# Patient Record
Sex: Male | Born: 1935
Health system: Southern US, Community
[De-identification: ages and names within clinical notes are randomized; demographics above are authoritative.]

## PROBLEM LIST (undated history)

## (undated) DIAGNOSIS — K5909 Other constipation: Secondary | ICD-10-CM

## (undated) DIAGNOSIS — I10 Essential (primary) hypertension: Secondary | ICD-10-CM

## (undated) DIAGNOSIS — R51 Headache: Secondary | ICD-10-CM

## (undated) DIAGNOSIS — K5790 Diverticulosis of intestine, part unspecified, without perforation or abscess without bleeding: Secondary | ICD-10-CM

## (undated) DIAGNOSIS — M1990 Unspecified osteoarthritis, unspecified site: Secondary | ICD-10-CM

## (undated) DIAGNOSIS — K449 Diaphragmatic hernia without obstruction or gangrene: Secondary | ICD-10-CM

## (undated) DIAGNOSIS — N200 Calculus of kidney: Secondary | ICD-10-CM

## (undated) DIAGNOSIS — I2510 Atherosclerotic heart disease of native coronary artery without angina pectoris: Secondary | ICD-10-CM

## (undated) DIAGNOSIS — E782 Mixed hyperlipidemia: Secondary | ICD-10-CM

## (undated) DIAGNOSIS — I219 Acute myocardial infarction, unspecified: Secondary | ICD-10-CM

## (undated) DIAGNOSIS — K219 Gastro-esophageal reflux disease without esophagitis: Secondary | ICD-10-CM

## (undated) DIAGNOSIS — K222 Esophageal obstruction: Secondary | ICD-10-CM

## (undated) DIAGNOSIS — N289 Disorder of kidney and ureter, unspecified: Secondary | ICD-10-CM

## (undated) DIAGNOSIS — K760 Fatty (change of) liver, not elsewhere classified: Secondary | ICD-10-CM

## (undated) DIAGNOSIS — I509 Heart failure, unspecified: Secondary | ICD-10-CM

## (undated) DIAGNOSIS — I255 Ischemic cardiomyopathy: Secondary | ICD-10-CM

## (undated) HISTORY — DX: Other constipation: K59.09

## (undated) HISTORY — PX: NEPHRECTOMY: SHX65

## (undated) HISTORY — DX: Gastro-esophageal reflux disease without esophagitis: K21.9

## (undated) HISTORY — DX: Essential (primary) hypertension: I10

## (undated) HISTORY — DX: Mixed hyperlipidemia: E78.2

## (undated) HISTORY — DX: Atherosclerotic heart disease of native coronary artery without angina pectoris: I25.10

## (undated) HISTORY — DX: Fatty (change of) liver, not elsewhere classified: K76.0

## (undated) HISTORY — DX: Calculus of kidney: N20.0

## (undated) HISTORY — DX: Diaphragmatic hernia without obstruction or gangrene: K44.9

## (undated) HISTORY — PX: HEMORRHOID SURGERY: SHX153

## (undated) HISTORY — DX: Ischemic cardiomyopathy: I25.5

## (undated) HISTORY — DX: Diverticulosis of intestine, part unspecified, without perforation or abscess without bleeding: K57.90

## (undated) HISTORY — PX: KIDNEY STONE SURGERY: SHX686

## (undated) HISTORY — DX: Esophageal obstruction: K22.2

## (undated) HISTORY — PX: KNEE ARTHROSCOPY: SUR90

---

## 2000-01-02 ENCOUNTER — Encounter: Admission: RE | Admit: 2000-01-02 | Discharge: 2000-01-02 | Payer: Self-pay | Admitting: *Deleted

## 2000-01-02 ENCOUNTER — Encounter: Payer: Self-pay | Admitting: *Deleted

## 2000-11-09 HISTORY — PX: CORONARY ARTERY BYPASS GRAFT: SHX141

## 2002-01-13 ENCOUNTER — Ambulatory Visit (HOSPITAL_COMMUNITY): Admission: RE | Admit: 2002-01-13 | Discharge: 2002-01-14 | Payer: Self-pay | Admitting: Cardiology

## 2002-02-09 ENCOUNTER — Encounter: Payer: Self-pay | Admitting: Thoracic Surgery (Cardiothoracic Vascular Surgery)

## 2002-02-13 ENCOUNTER — Encounter: Payer: Self-pay | Admitting: Thoracic Surgery (Cardiothoracic Vascular Surgery)

## 2002-02-13 ENCOUNTER — Inpatient Hospital Stay (HOSPITAL_COMMUNITY)
Admission: RE | Admit: 2002-02-13 | Discharge: 2002-02-18 | Payer: Self-pay | Admitting: Thoracic Surgery (Cardiothoracic Vascular Surgery)

## 2002-02-14 ENCOUNTER — Encounter: Payer: Self-pay | Admitting: Thoracic Surgery (Cardiothoracic Vascular Surgery)

## 2002-02-15 ENCOUNTER — Encounter: Payer: Self-pay | Admitting: Thoracic Surgery (Cardiothoracic Vascular Surgery)

## 2002-05-06 ENCOUNTER — Inpatient Hospital Stay (HOSPITAL_COMMUNITY): Admission: EM | Admit: 2002-05-06 | Discharge: 2002-05-08 | Payer: Self-pay

## 2002-06-01 ENCOUNTER — Ambulatory Visit (HOSPITAL_COMMUNITY): Admission: RE | Admit: 2002-06-01 | Discharge: 2002-06-02 | Payer: Self-pay | Admitting: Cardiology

## 2004-02-20 ENCOUNTER — Emergency Department (HOSPITAL_COMMUNITY): Admission: EM | Admit: 2004-02-20 | Discharge: 2004-02-20 | Payer: Self-pay | Admitting: Emergency Medicine

## 2004-10-07 ENCOUNTER — Ambulatory Visit: Payer: Self-pay | Admitting: Family Medicine

## 2004-11-17 ENCOUNTER — Ambulatory Visit: Payer: Self-pay | Admitting: Family Medicine

## 2005-01-05 ENCOUNTER — Ambulatory Visit: Payer: Self-pay | Admitting: Cardiology

## 2005-06-08 ENCOUNTER — Ambulatory Visit: Payer: Self-pay | Admitting: Family Medicine

## 2005-07-15 ENCOUNTER — Ambulatory Visit: Payer: Self-pay | Admitting: Family Medicine

## 2005-08-11 ENCOUNTER — Ambulatory Visit: Payer: Self-pay | Admitting: Family Medicine

## 2005-09-09 ENCOUNTER — Ambulatory Visit: Payer: Self-pay | Admitting: Family Medicine

## 2005-11-09 DIAGNOSIS — K5790 Diverticulosis of intestine, part unspecified, without perforation or abscess without bleeding: Secondary | ICD-10-CM

## 2005-11-09 HISTORY — DX: Diverticulosis of intestine, part unspecified, without perforation or abscess without bleeding: K57.90

## 2005-12-16 ENCOUNTER — Ambulatory Visit: Payer: Self-pay | Admitting: Family Medicine

## 2006-01-11 ENCOUNTER — Ambulatory Visit: Payer: Self-pay | Admitting: Family Medicine

## 2006-01-28 ENCOUNTER — Ambulatory Visit: Payer: Self-pay | Admitting: Family Medicine

## 2006-02-02 ENCOUNTER — Ambulatory Visit: Payer: Self-pay | Admitting: Family Medicine

## 2006-02-24 ENCOUNTER — Ambulatory Visit: Payer: Self-pay | Admitting: Internal Medicine

## 2006-02-24 ENCOUNTER — Ambulatory Visit (HOSPITAL_COMMUNITY): Admission: RE | Admit: 2006-02-24 | Discharge: 2006-02-24 | Payer: Self-pay | Admitting: Internal Medicine

## 2006-03-10 ENCOUNTER — Ambulatory Visit: Payer: Self-pay | Admitting: Internal Medicine

## 2006-03-15 ENCOUNTER — Ambulatory Visit: Payer: Self-pay | Admitting: Family Medicine

## 2006-03-18 ENCOUNTER — Ambulatory Visit (HOSPITAL_COMMUNITY): Admission: RE | Admit: 2006-03-18 | Discharge: 2006-03-18 | Payer: Self-pay | Admitting: Internal Medicine

## 2006-05-17 ENCOUNTER — Emergency Department (HOSPITAL_COMMUNITY): Admission: EM | Admit: 2006-05-17 | Discharge: 2006-05-17 | Payer: Self-pay | Admitting: Emergency Medicine

## 2006-07-27 ENCOUNTER — Ambulatory Visit: Payer: Self-pay | Admitting: Family Medicine

## 2006-08-24 ENCOUNTER — Ambulatory Visit: Payer: Self-pay | Admitting: Cardiology

## 2006-09-03 ENCOUNTER — Ambulatory Visit: Payer: Self-pay | Admitting: Family Medicine

## 2006-09-21 ENCOUNTER — Ambulatory Visit: Payer: Self-pay | Admitting: Cardiology

## 2006-10-01 ENCOUNTER — Ambulatory Visit: Payer: Self-pay | Admitting: Cardiology

## 2006-10-04 ENCOUNTER — Encounter: Payer: Self-pay | Admitting: Physician Assistant

## 2006-10-08 ENCOUNTER — Inpatient Hospital Stay (HOSPITAL_COMMUNITY): Admission: EM | Admit: 2006-10-08 | Discharge: 2006-10-11 | Payer: Self-pay | Admitting: Emergency Medicine

## 2006-10-19 ENCOUNTER — Ambulatory Visit: Payer: Self-pay | Admitting: Family Medicine

## 2006-11-04 ENCOUNTER — Ambulatory Visit: Payer: Self-pay | Admitting: Cardiovascular Disease

## 2006-11-04 ENCOUNTER — Inpatient Hospital Stay (HOSPITAL_BASED_OUTPATIENT_CLINIC_OR_DEPARTMENT_OTHER): Admission: RE | Admit: 2006-11-04 | Discharge: 2006-11-04 | Payer: Self-pay | Admitting: Cardiovascular Disease

## 2006-11-10 ENCOUNTER — Ambulatory Visit: Payer: Self-pay | Admitting: Internal Medicine

## 2006-11-18 ENCOUNTER — Ambulatory Visit: Payer: Self-pay | Admitting: Cardiology

## 2006-11-30 ENCOUNTER — Ambulatory Visit: Payer: Self-pay | Admitting: Family Medicine

## 2007-02-16 ENCOUNTER — Ambulatory Visit: Payer: Self-pay | Admitting: Cardiology

## 2007-04-25 ENCOUNTER — Ambulatory Visit: Payer: Self-pay | Admitting: Family Medicine

## 2007-08-12 ENCOUNTER — Emergency Department (HOSPITAL_COMMUNITY): Admission: EM | Admit: 2007-08-12 | Discharge: 2007-08-12 | Payer: Self-pay | Admitting: Emergency Medicine

## 2007-12-02 ENCOUNTER — Ambulatory Visit (HOSPITAL_COMMUNITY): Admission: RE | Admit: 2007-12-02 | Discharge: 2007-12-02 | Payer: Self-pay | Admitting: Internal Medicine

## 2007-12-02 ENCOUNTER — Ambulatory Visit: Payer: Self-pay | Admitting: Internal Medicine

## 2007-12-16 ENCOUNTER — Ambulatory Visit (HOSPITAL_COMMUNITY): Admission: RE | Admit: 2007-12-16 | Discharge: 2007-12-16 | Payer: Self-pay | Admitting: Internal Medicine

## 2007-12-16 ENCOUNTER — Ambulatory Visit: Payer: Self-pay | Admitting: Internal Medicine

## 2008-03-27 ENCOUNTER — Ambulatory Visit: Payer: Self-pay | Admitting: Cardiology

## 2009-01-17 ENCOUNTER — Encounter (INDEPENDENT_AMBULATORY_CARE_PROVIDER_SITE_OTHER): Payer: Self-pay | Admitting: *Deleted

## 2009-01-22 ENCOUNTER — Encounter: Payer: Self-pay | Admitting: Gastroenterology

## 2009-02-18 DIAGNOSIS — Z8719 Personal history of other diseases of the digestive system: Secondary | ICD-10-CM

## 2009-02-18 DIAGNOSIS — N200 Calculus of kidney: Secondary | ICD-10-CM

## 2009-02-18 DIAGNOSIS — K219 Gastro-esophageal reflux disease without esophagitis: Secondary | ICD-10-CM

## 2009-02-18 DIAGNOSIS — E7800 Pure hypercholesterolemia, unspecified: Secondary | ICD-10-CM

## 2009-02-18 DIAGNOSIS — K449 Diaphragmatic hernia without obstruction or gangrene: Secondary | ICD-10-CM | POA: Insufficient documentation

## 2009-02-18 DIAGNOSIS — F17200 Nicotine dependence, unspecified, uncomplicated: Secondary | ICD-10-CM | POA: Insufficient documentation

## 2009-02-19 ENCOUNTER — Ambulatory Visit: Payer: Self-pay | Admitting: Internal Medicine

## 2009-02-19 DIAGNOSIS — K5909 Other constipation: Secondary | ICD-10-CM

## 2009-02-19 DIAGNOSIS — R142 Eructation: Secondary | ICD-10-CM

## 2009-02-19 DIAGNOSIS — R143 Flatulence: Secondary | ICD-10-CM

## 2009-02-19 DIAGNOSIS — R609 Edema, unspecified: Secondary | ICD-10-CM

## 2009-02-19 DIAGNOSIS — R141 Gas pain: Secondary | ICD-10-CM

## 2009-02-20 ENCOUNTER — Ambulatory Visit (HOSPITAL_COMMUNITY): Admission: RE | Admit: 2009-02-20 | Discharge: 2009-02-20 | Payer: Self-pay | Admitting: Internal Medicine

## 2009-02-25 ENCOUNTER — Encounter: Payer: Self-pay | Admitting: Internal Medicine

## 2009-02-27 ENCOUNTER — Encounter: Payer: Self-pay | Admitting: Gastroenterology

## 2009-02-28 LAB — CONVERTED CEMR LAB
AST: 14 units/L (ref 0–37)
Total Bilirubin: 0.5 mg/dL (ref 0.3–1.2)

## 2009-04-01 ENCOUNTER — Ambulatory Visit: Payer: Self-pay | Admitting: Cardiology

## 2009-04-11 ENCOUNTER — Encounter: Payer: Self-pay | Admitting: Cardiology

## 2009-04-15 ENCOUNTER — Encounter: Payer: Self-pay | Admitting: Cardiology

## 2009-05-22 ENCOUNTER — Encounter: Payer: Self-pay | Admitting: Cardiology

## 2009-07-10 ENCOUNTER — Emergency Department (HOSPITAL_COMMUNITY): Admission: EM | Admit: 2009-07-10 | Discharge: 2009-07-11 | Payer: Self-pay | Admitting: Emergency Medicine

## 2009-07-11 ENCOUNTER — Ambulatory Visit (HOSPITAL_COMMUNITY): Admission: RE | Admit: 2009-07-11 | Discharge: 2009-07-11 | Payer: Self-pay | Admitting: Emergency Medicine

## 2009-07-26 DIAGNOSIS — I2589 Other forms of chronic ischemic heart disease: Secondary | ICD-10-CM

## 2010-01-24 ENCOUNTER — Encounter (INDEPENDENT_AMBULATORY_CARE_PROVIDER_SITE_OTHER): Payer: Self-pay | Admitting: *Deleted

## 2010-02-21 ENCOUNTER — Ambulatory Visit: Payer: Self-pay | Admitting: Internal Medicine

## 2010-02-21 DIAGNOSIS — R1319 Other dysphagia: Secondary | ICD-10-CM | POA: Insufficient documentation

## 2010-02-24 ENCOUNTER — Encounter: Payer: Self-pay | Admitting: Gastroenterology

## 2010-02-24 ENCOUNTER — Ambulatory Visit: Payer: Self-pay | Admitting: Gastroenterology

## 2010-02-27 ENCOUNTER — Encounter: Payer: Self-pay | Admitting: Gastroenterology

## 2010-03-12 ENCOUNTER — Ambulatory Visit (HOSPITAL_COMMUNITY): Admission: RE | Admit: 2010-03-12 | Discharge: 2010-03-12 | Payer: Self-pay | Admitting: Internal Medicine

## 2010-03-12 ENCOUNTER — Ambulatory Visit: Payer: Self-pay | Admitting: Internal Medicine

## 2010-05-19 ENCOUNTER — Encounter: Payer: Self-pay | Admitting: Cardiology

## 2010-06-04 ENCOUNTER — Ambulatory Visit: Payer: Self-pay | Admitting: Cardiology

## 2010-06-04 DIAGNOSIS — I2510 Atherosclerotic heart disease of native coronary artery without angina pectoris: Secondary | ICD-10-CM

## 2010-06-04 DIAGNOSIS — I10 Essential (primary) hypertension: Secondary | ICD-10-CM

## 2010-11-30 ENCOUNTER — Encounter: Payer: Self-pay | Admitting: Internal Medicine

## 2010-12-09 NOTE — Assessment & Plan Note (Signed)
Summary: fu ov 1 yr,gerd,constipation/CM   Visit Type:  Follow-up Visit Primary Care Provider:  [place]  Chief Complaint:  F/U gerd/constipation.  History of Present Illness:  Followup GERD and constipation. History of Schatzki's ring requiring dilation previously.  He now has much more frequent episodes of transient food impaction and dysphagia. Reflux symptoms well-controlled on pantoprazole. He does complain of gas bloat and difficulty having a bowel movement time to time. He takes MiraLax only sporadically.  Colonoscopy 2007 demonstrated left-sided diverticula and no family history of GI neoplasia.  Last EGD/ED 2007 - schatzki ring.  BPE previously demonstrated obstruction at EG[facility] (pill).   Current Medications (verified): 1)  Plavix 75 Mg Tabs (Clopidogrel Bisulfate) .... Take 1 Tablet By Mouth Once A Day 2)  Simvastatin 80 Mg Tabs (Simvastatin) .... Once Daily 3)  Carvedilol 12.5 Mg Tabs (Carvedilol) .... Take One Tablet By Mouth Twice A Day 4)  Lisinopril 30 Mg Tabs (Lisinopril) .... Take 2 Tablets Every Day 5)  Amlodipine Besylate 10 Mg Tabs (Amlodipine Besylate) .... Once Daily 6)  Avodart .... 0.5 Mg Once Daily 7)  Pantoprazole Sodium 40 Mg Tbec (Pantoprazole Sodium) .... Take One By Mouth 30 Mins Before Breakfast. Take 10 Hours Apart From Plavix 8)  Align  Caps (Misc Intestinal Flora Regulat) .... One By Mouth Daily For Four Weeks 9)  Hydrochlorothiazide 12.5 Mg Tabs (Hydrochlorothiazide) .... Take One Tablet By Mouth Daily. 10)  Miralax  Powd (Polyethylene Glycol 3350) .... Once Daily  Allergies (verified): No Known Drug Allergies  Past History:  Past Medical History: Last updated: 07/26/2009 LEG EDEMA, BILATERAL (ICD-782.3) HYPERCHOLESTEROLEMIA (ICD-272.0) Hx of HYPERTENSION (ICD-401.9) CORONARY ARTERY DISEASE (ICD-414.00) CARDIOMYOPATHY, ISCHEMIC (ICD-414.8) FATTY LIVER DISEASE, HX OF (ICD-V12.79) FLATULENCE (ICD-787.3) CONSTIPATION, CHRONIC (ICD-564.09) ABDOMINAL  DISTENSION (ICD-787.3) TOBACCO USER (ICD-305.1) NEPHROLITHIASIS (ICD-592.0) HIATAL HERNIA (ICD-553.3) SCHATZKI'S RING, HX OF (ICD-V12.79) Hx of GASTROESOPHAGEAL REFLUX DISEASE, CHRONIC (ICD-530.81) ACID REFLUX DISEASE (ICD-530.81)  Past Surgical History: Last updated: 02/18/2009 CORONARY ARTERY DISEASE,STATUS POST FIVE-VESSEL CORONARY ARTERY BYPASS GRAFT 2002 HIS LEFT KIDNEY APPARENTLY WAS REMOVED FOR UNCLEAR REASONS HE ALSO HAD SURGERY TO HAVE A STONE REMMOVED FROM HIS RIGHT KIDNEY.  Family History: Last updated: 07/26/2009 No FH of Colon Cancer: Family History of Coronary Artery Disease:   Social History: Last updated: 07/26/2009 Marital Status: Married Children: 4 boys, 2 girls Occupation:  Patient is a former smoker.  50 years ago.  1 pack per 3 weeks Alcohol Use - no Drug Use - no  Risk Factors: Smoking Status: quit (02/19/2009)  Vital Signs:  Patient profile:   75 year old male Height:      66 inches Weight:      208 pounds BMI:     33.69 Temp:     97.5 degrees F oral Pulse rate:   64 / minute BP sitting:   118 / 78  (left arm) Cuff size:   regular  Vitals Entered By: Cloria Spring LPN (February 21, 2010 9:07 AM)  Physical Exam  General:  somewhat disheveled 75 year old gentleman was alert and conversant pleasant no acute distress. Eyes:  no scleral icterus conjunctiva are pink Abdomen:  obese positive bowel sounds soft, nontender without appreciable mass or MA  Impression & Recommendations: Impression: History of GERD. History of Schatzki's ring dilated previously; now with recurrent esophageal dysphagia  Intermittent constipation and bloating. Diverticulosis 2007 colonoscopy.  Recommendations: Diagnostic EGD with esophageal dilation as appropriate in the near future the hospital. Risk, benefits, limitations, alternatives and imponderables have been reviewed. Questions answered; all  parties agreeable.  Continue protonix 40 mg orally daily  Utilize  MiraLax 17 g orally at bedtime; have a low threshold for taking a second dose in the morning  Benefiber 1 tablespoon dailyx  We'll send him home with one immunofecal occult blood test      Appended Document: Orders Update    Clinical Lists Changes  Problems: Added new problem of DYSPHAGIA (EPP-295.18) Orders: Added new Service order of Est. Patient Level III (84166) - Signed

## 2010-12-09 NOTE — Letter (Signed)
Summary: [facility] Appointment Letter  [facility]  [address]   [place], [place] [address]   Phone: [phone]  Fax: [phone]    01/24/2010  [name] [name] [address] [place], [place]  [address] [birth year]  Dear [name] [name],   Your physician has indicated that:   _______it is time to schedule an appointment.   _______you missed your appointment on______ and need to call and  reschedule.   _______you need to have lab work done.   _______you need to schedule an appointment to discuss lab or test results.   _______you need to call to reschedule your appointment that was scheduled on _________.   Please call our office at  [phone].    Thank you,    [facility] Ph: [phone]   Fax: [phone]

## 2010-12-09 NOTE — Medication Information (Signed)
Summary: Tax adviser   Imported By: Hendricks Limes LPN 16/08/9603 54:09:81  _____________________________________________________________________  External Attachment:    Type:   Image     Comment:   External Document

## 2010-12-09 NOTE — Letter (Signed)
Summary: EGD/ED ORDER  EGD/ED ORDER   Imported By: Ave Filter 02/21/2010 09:39:10  _____________________________________________________________________  External Attachment:    Type:   Image     Comment:   External Document

## 2010-12-09 NOTE — Assessment & Plan Note (Signed)
Summary: DROPPED OFF STOOL/SS   History of Present Illness: stool negative for occult blood / please let him know pt returned ifobt and it was negative  Allergies: No Known Drug Allergies  Other Orders: Immuno-chemical Fecal Occult (23762)

## 2010-12-09 NOTE — Medication Information (Signed)
Summary: Tax adviser   Imported By: Diana Eves 02/24/2010 13:58:55  _____________________________________________________________________  External Attachment:    Type:   Image     Comment:   External Document  Appended Document: RX Folder - pantoprazole    Prescriptions: PANTOPRAZOLE SODIUM 40 MG TBEC (PANTOPRAZOLE SODIUM) take one by mouth 30 mins before breakfast. take 10 hours apart from plavix  #30 x 11   Entered and Authorized by:   Leanna Battles. Dixon Boos   Signed by:   Leanna Battles Dixon Boos on 02/24/2010   Method used:   Electronically to        [id number] (retail)       [address]       [place], [place]  [address]       Ph: [phone] or [phone]       Fax: [phone]   RxID:   [id number]

## 2010-12-09 NOTE — Assessment & Plan Note (Signed)
Summary: 2 YR FU  LAST SEEN 2009  Medications Added LIPITOR 40 MG TABS (ATORVASTATIN CALCIUM) Take 1 tablet by mouth once a day LISINOPRIL 30 MG TABS (LISINOPRIL) Take 1 tablet by mouth two times a day AMLODIPINE BESYLATE 10 MG TABS (AMLODIPINE BESYLATE) Take 1 tablet by mouth once a day AVODART 0.5 MG CAPS (DUTASTERIDE) Take 1 tablet by mouth once a day HYDROCODONE-ACETAMINOPHEN 5-500 MG TABS (HYDROCODONE-ACETAMINOPHEN) Take 1 tablet by mouth once a day      Allergies Added: NKDA  Visit Type:  Follow-up Primary Provider:  Dr. Joette Catching   History of Present Illness: 75 year old male presents to the office for followup. He was last seen in May 2010. He reports no problems with angina or progressive shortness of breath. He indicates staying active with house chores, but no regular exercise. He reports having labs obtained with Dr. Lysbeth Galas a few weeks ago, which we will request for review.  I reviewed his medications. He is now on Lipitor instead of simvastatin and high dose. He is tolerating this medicine.  He recently underwent an esophageal dilatation, improving dysphasia. He is followed by Dr. Jena Gauss.  No reported problems with orthopnea or PND. No progressive lower extremity edema.  Preventive Screening-Counseling & Management  Alcohol-Tobacco     Smoking Status: quit     Year Quit: 1980     Cans of tobacco/week: chews 2pks/wk     Tobacco Counseling: not to resume use of tobacco products  Current Medications (verified): 1)  Plavix 75 Mg Tabs (Clopidogrel Bisulfate) .... Take 1 Tablet By Mouth Once A Day 2)  Lipitor 40 Mg Tabs (Atorvastatin Calcium) .... Take 1 Tablet By Mouth Once A Day 3)  Carvedilol 12.5 Mg Tabs (Carvedilol) .... Take One Tablet By Mouth Twice A Day 4)  Lisinopril 30 Mg Tabs (Lisinopril) .... Take 1 Tablet By Mouth Two Times A Day 5)  Amlodipine Besylate 10 Mg Tabs (Amlodipine Besylate) .... Take 1 Tablet By Mouth Once A Day 6)  Avodart 0.5 Mg Caps  (Dutasteride) .... Take 1 Tablet By Mouth Once A Day 7)  Pantoprazole Sodium 40 Mg Tbec (Pantoprazole Sodium) .... Take One By Mouth 30 Mins Before Breakfast. Take 10 Hours Apart From Plavix 8)  Align  Caps (Misc Intestinal Flora Regulat) .... One By Mouth Daily For Four Weeks 9)  Hydrochlorothiazide 12.5 Mg Tabs (Hydrochlorothiazide) .... Take One Tablet By Mouth Daily. 10)  Miralax  Powd (Polyethylene Glycol 3350) .... Once Daily 11)  Hydrocodone-Acetaminophen 5-500 Mg Tabs (Hydrocodone-Acetaminophen) .... Take 1 Tablet By Mouth Once A Day  Allergies (verified): No Known Drug Allergies  Comments:  Nurse/Medical Assistant: The patient's medication bottles and allergies were reviewed with the patient and were updated in the Medication and Allergy Lists.  Past History:  Social History: Last updated: 06/04/2010 Marital Status: Married Children: 4 boys, 2 girls Patient is a former smoker - 50 years ago Alcohol Use - no Drug Use - no  Past Medical History: CAD - multivessel, known graft disease (3/5 patent), LVEF 35% G E R D Schatzki's ring - status post dilatation Hiatal hernia Nephrolithiasis Chronic constipation Fatty liver disease Hyperlipidemia Hypertension  Past Surgical History: Left nephrectomy Right renal stone extraction CABG 2003 - LIMA to LAD, SVG to first diagonal and ramus, SVG to PDA and PLA   Family History: No FH of Colon Cancer Family History of Coronary Artery Disease  Social History: Marital Status: Married Children: 4 boys, 2 girls Patient is a former smoker - 50  years ago Alcohol Use - no Drug Use - no Cans of tobacco/week:  chews 2pks/wk  Review of Systems       The patient complains of dyspnea on exertion.  The patient denies anorexia, fever, chest pain, syncope, peripheral edema, prolonged cough, abdominal pain, melena, hematochezia, and severe indigestion/heartburn.         Otherwise reviewed and negative.  Vital Signs:  Patient  profile:   75 year old male Height:      66 inches Weight:      207 pounds Pulse rate:   58 / minute BP sitting:   115 / 74  (left arm) Cuff size:   large  Vitals Entered By: Carlye Grippe (June 04, 2010 9:19 AM)  Physical Exam  Additional Exam:  Overweight male in no acute distress. HEENT: Conjunctiva and lids normal, oropharynx with poor dentition. Neck: Supple, no elevated JVP or bruits. Lungs: Diminished, nonlabored. Cardiac: Regular rate and rhythm, indistinct PMI, no S3. Abdomen: Soft, nontender, bowel sounds present. Skin: Warm and dry. Extremities: 1+ edema below the knees, symmetrical. Distal pulses one plus. Musculoskeletal: No gross deformities. Neuropsychiatric: Alert and oriented x3, affect appropriate.   Nuclear Study  Procedure date:  09/21/2006  Findings:      Adenosine Cardiolite with hypertensive response but no diagnostic ST changes. LVEF 35%. Moderately large inferolateral defect which is partially reversible consistent with scar and moderate peri-infarct ischemia.  Cardiac Cath  Procedure date:  11/04/2006  Findings:       LIMA angiography:  The LIMA to mid LAD is widely patent.  The mid and  distal LAD are very small segments.  They appear diffusely diseased.  There is a faint left-to-right collateral from the distal LAD.   Saphenous vein graft described is a sequence graft to the diagonal and  intermediate branch is patent to the diagonal.  The diagonal branch  appears to be a large vessel and there are 2 significant branches that  fill from the graft.  The sequence portion to the intermediate branch  appears to be occluded.  There is a stented segment that approaches the  intermediate branch and there is no flow proximal to the stent.  I  suspect there was a limb of the graft anastomosed to this area that is  now occluded.  The diagonal territory that is supplied by the vein graft  is widely patent as is the vein graft proper.   Saphenous vein  graft sequence to the PDA and posterolateral segment is  patent to the PDA portion.  I do not visualize the PL limb of the graft.  The PDA is a very small vessel throughout and has a 90% stenosis just  beyond the graft.  This is less than a 1 mm diameter vessel.  The PL  segment fills in a retrograde fashion via the graft flow.   Left ventriculography performed in the 30 degrees right anterior oblique  projection shows moderately severe segmental left ventricular  dysfunction with an estimated left ventricular ejection fraction of 35%.  The mid inferior wall is severely hypokinetic and the anteroapical wall  is hypokinetic.  There is no significant mitral regurgitation  appreciated.  EKG  Procedure date:  06/04/2010  Findings:      Sinus rhythm with incomplete left bundle branch block pattern, QRS duration 108 ms, nonspecific ST-T changes.  Impression & Recommendations:  Problem # 1:  CORONARY ATHEROSCLEROSIS NATIVE CORONARY ARTERY (ICD-414.01)  Symptomatically stable without active angina on medical therapy.  Patient comfortable with annual followup. He continues to see Dr. Lysbeth Galas otherwise on a regular basis.  His updated medication list for this problem includes:    Plavix 75 Mg Tabs (Clopidogrel bisulfate) .Marland Kitchen... Take 1 tablet by mouth once a day    Carvedilol 12.5 Mg Tabs (Carvedilol) .Marland Kitchen... Take one tablet by mouth twice a day    Lisinopril 30 Mg Tabs (Lisinopril) .Marland Kitchen... Take 1 tablet by mouth two times a day    Amlodipine Besylate 10 Mg Tabs (Amlodipine besylate) .Marland Kitchen... Take 1 tablet by mouth once a day  Orders: EKG w/ Interpretation (93000)  Problem # 2:  HYPERCHOLESTEROLEMIA (ICD-272.0)  Patient tolerating Lipitor. Will request recent labs are reviewed.  His updated medication list for this problem includes:    Lipitor 40 Mg Tabs (Atorvastatin calcium) .Marland Kitchen... Take 1 tablet by mouth once a day  Problem # 3:  CARDIOMYOPATHY, ISCHEMIC (ICD-414.8)  Patient appears  euvolemic, with NYHA class II dyspnea on exertion has been stable. He has traditionally not been interested in pursuing device therapy when we had discussed this over time. We will consider a followup echocardiogram around the time of his next visit.  His updated medication list for this problem includes:    Plavix 75 Mg Tabs (Clopidogrel bisulfate) .Marland Kitchen... Take 1 tablet by mouth once a day    Carvedilol 12.5 Mg Tabs (Carvedilol) .Marland Kitchen... Take one tablet by mouth twice a day    Lisinopril 30 Mg Tabs (Lisinopril) .Marland Kitchen... Take 1 tablet by mouth two times a day    Amlodipine Besylate 10 Mg Tabs (Amlodipine besylate) .Marland Kitchen... Take 1 tablet by mouth once a day    Hydrochlorothiazide 12.5 Mg Tabs (Hydrochlorothiazide) .Marland Kitchen... Take one tablet by mouth daily.  Problem # 4:  Hx of ESSENTIAL HYPERTENSION, BENIGN (ICD-401.1)  Blood pressure well controlled today.  His updated medication list for this problem includes:    Carvedilol 12.5 Mg Tabs (Carvedilol) .Marland Kitchen... Take one tablet by mouth twice a day    Lisinopril 30 Mg Tabs (Lisinopril) .Marland Kitchen... Take 1 tablet by mouth two times a day    Amlodipine Besylate 10 Mg Tabs (Amlodipine besylate) .Marland Kitchen... Take 1 tablet by mouth once a day    Hydrochlorothiazide 12.5 Mg Tabs (Hydrochlorothiazide) .Marland Kitchen... Take one tablet by mouth daily.  Patient Instructions: 1)  Your physician wants you to follow-up in: 1 year. You will receive a reminder letter in the mail one-two months in advance. If you don't receive a letter, please call our office to schedule the follow-up appointment. 2)  Your physician recommends that you continue on your current medications as directed. Please refer to the Current Medication list given to you today. 3)  We have requested your most recent labs from Dr. Lysbeth Galas.

## 2011-02-13 LAB — URINALYSIS, ROUTINE W REFLEX MICROSCOPIC
Ketones, ur: NEGATIVE mg/dL
Nitrite: NEGATIVE
Protein, ur: NEGATIVE mg/dL
Specific Gravity, Urine: 1.02 (ref 1.005–1.030)
Urobilinogen, UA: 2 mg/dL — ABNORMAL HIGH (ref 0.0–1.0)
pH: 5.5 (ref 5.0–8.0)

## 2011-02-13 LAB — COMPREHENSIVE METABOLIC PANEL
AST: 24 U/L (ref 0–37)
Albumin: 3.1 g/dL — ABNORMAL LOW (ref 3.5–5.2)
CO2: 28 mEq/L (ref 19–32)
Chloride: 104 mEq/L (ref 96–112)
Creatinine, Ser: 1.75 mg/dL — ABNORMAL HIGH (ref 0.4–1.5)
GFR calc Af Amer: 47 mL/min — ABNORMAL LOW (ref >60)
Total Bilirubin: 1.1 mg/dL (ref 0.3–1.2)
Total Protein: 5.9 g/dL — ABNORMAL LOW (ref 6.0–8.3)

## 2011-02-13 LAB — DIFFERENTIAL
Basophils Relative: 0 % (ref 0–1)
Eosinophils Relative: 1 % (ref 0–5)
Monocytes Relative: 4 % (ref 3–12)
Neutro Abs: 8 10*3/uL — ABNORMAL HIGH (ref 1.7–7.7)
Neutrophils Relative %: 92 % — ABNORMAL HIGH (ref 43–77)

## 2011-02-13 LAB — CBC
Hemoglobin: 14.1 g/dL (ref 13.0–17.0)
MCV: 81.8 fL (ref 78.0–100.0)
RBC: 4.85 MIL/uL (ref 4.22–5.81)
RDW: 13.8 % (ref 11.5–15.5)

## 2011-02-13 LAB — URINE MICROSCOPIC-ADD ON

## 2011-02-13 LAB — CULTURE, BLOOD (ROUTINE X 2): Report Status: 9072010

## 2011-02-19 ENCOUNTER — Other Ambulatory Visit: Payer: Self-pay | Admitting: Gastroenterology

## 2011-02-20 NOTE — Telephone Encounter (Signed)
Patient needs office visit within next couple of months.

## 2011-02-25 ENCOUNTER — Encounter: Payer: Self-pay | Admitting: Internal Medicine

## 2011-02-25 NOTE — Telephone Encounter (Signed)
Pt is aware of his OV with RMR

## 2011-03-24 NOTE — Assessment & Plan Note (Signed)
[facility]                          [facility] OFFICE NOTE   [name] [name], [name] [name]                     MRN:          [id number]  DATE:03/27/2008                            DOB:          16-Feb-1936    REASON FOR VISIT:  Follow-up cardiovascular disease.   HISTORY OF PRESENT ILLNESS:  [name] [name] returns for a one year follow-  up.  He is not reporting any significant angina.  He has NYHA class II  dyspnea on exertion.  Today's electrocardiogram shows sinus bradycardia  with evidence of prior anterior infarct and QRS widening at 118  milliseconds.  These findings are old, in fact, actually more consistent  with a left bundle branch block on some of his previous tracings.  His  blood pressure is much better controlled today.  Heart rate is also well  controlled.  He states that he has been taking his carvedilol at 12 1/2  mg twice daily has been compliant with his medications otherwise.  He  has had no problems with palpitations or syncope.   ALLERGIES:  NO KNOWN DRUG ALLERGIES.   MEDICATIONS:  1. Carvedilol 12.5 mg p.o. b.i.d.  2. Plavix 75 mg p.o. daily.  3. Omeprazole 20 mg p.o. daily.  4. Lisinopril 30 mg p.o. b.i.d.  5. Avodart 0.5 mg p.o. daily.  6. Simvastatin 40 mg p.o. daily.  7. Hydrochlorothiazide 25 mg p.o. daily.  8. Aspirin 81 mg p.o. daily.  9. Propoxyphene APAP as directed.  10.Amlodipine 10 mg p.o. daily.  11.Sublingual nitroglycerin 0.4 mg.   REVIEW OF SYSTEMS:  As described in the history of present illness.  Otherwise negative.   PHYSICAL EXAMINATION:  VITAL SIGNS:  Blood pressure is 119/76, heart  rate 56 and regular, weight is 212 pounds, up from 208 pounds.  GENERAL:  An overweight male in no acute distress.  HEENT:  Conjunctiva is normal.  Pharynx clear.  Poor addition.  NECK:  Supple.  No elevated jugular venous pressure.  No loud bruits.  LUNGS:  Clear with diminished breath sounds.  CARDIAC:  Regular rate and  rhythm with soft systolic murmur at the base.  No S3 gallop.  No pericardial rub.  ABDOMEN:  Soft, nontender, no active bowel sounds.  EXTREMITIES:  Exhibit trace edema, some venous stasis, nonpitting.  Distal pulses 1+.  MUSCULOSKELETAL:  No kyphosis.  NEUROPSYCHIATRIC:  Patient alert and oriented x3.  Affect is appropriate   IMPRESSION/RECOMMENDATIONS:  Ischemic cardiomyopathy with ejection  fraction of 35% associated with severe multivessel cardiovascular  disease status post coronary bypass grafting.  He has 3/5 patent bypass  grafts by angiography in January 2008 and is not reporting any  progressive angina or breathlessness.  He is on a good medical regimen  and has done better blood pressure control.  I have encouraged him to  remain active and work on weight loss.  He is not interested in a  defibrillator.  We have discussed this in the past.  We provided  prescription refills for carvedilol and kept the dose stable given his  heart rate in the 50s to 60s.  He will continue regular follow-up with  Dr. Lysbeth Galas who has been following the patient's lipids.  Goal LDL should  be around 70.  We will otherwise plan to see him back in one year's  time.     Jonelle Sidle, MD  Electronically Signed    SGM/MedQ  DD: 03/27/2008  DT: 03/27/2008  Job #: [id number]   cc:   Delaney Meigs, M.D.

## 2011-03-24 NOTE — Assessment & Plan Note (Signed)
NAMEMarland Kitchen  CADEL, STAIRS               CHART#:  [id number]   DATE:  12/02/2007                       DOB:  [birth year]   CHIEF COMPLAINT:  Yearly followup of acid reflux.   SUBJECTIVE:  The patient is here for followup visit.  The patient last  seen on 11/10/2006.  He has a chronic history of GERD, well controlled  on ranitidine.  History of Schatzki's ring status post dilatation in May  2007 as well.  He presents today stating that his reflux is well-  controlled.  He denies any abdominal pain.  His bowel movements occur 1  to 2 times daily.  No blood in the stool.  He has noted some increasing  difficulty swallowing over the last 6 months.  This occurs both with  liquids, water, and sometimes solids.  At times, when he swallows water,  it sits in his chest and then comes back up.  He does feel like solid  foods get caught on the way down as well.  He had his esophageus  stretched previously and it seemed to help for just a short period of  time.  He had a 46 [place] Maloney dilator passed previously with rupture  of Schatzki's ring.  He also is known to have a moderate sized hiatal  hernia.   CURRENT MEDICATIONS:  See updated list.   ALLERGIES:  No known drug allergies.   PHYSICAL EXAM:  Weight 212, up from 203, temperature 97.8, blood  pressure 120/78, pulse 60.  GENERAL:  Pleasant, well-developed, well-nourished Caucasian gentleman  in no acute distress.  SKIN:  Warm and dry.  No jaundice.  HEENT:  Sclerae nonicteric.  Oropharynx mucosa moist and pink.  No  lesions or exudate.  No erythema.  No lymphadenopathy or thyromegaly.  CHEST:  Clear to auscultation.  CARDIAC:  Regular rate and rhythm.  ABDOMEN:  Positive bowel sounds.  Obese.  Symmetrical.  Soft.  Nontender.  No organomegaly or masses.   IMPRESSION:  1. Chronic gastroesophageal reflux disease well controlled on      ranitidine 300 mg daily.  2. Dysphagia to liquids with history of Schatzki's ring in the past.      EGD  1-1/2 years ago.   RECOMMENDATIONS:  1. Barium swallow esophagram.  2. Continue ranitidine 300 mg daily.  3. Further recommendations to follow.       Tana Coast, P.A.  Electronically Signed     R. Roetta Sessions, M.D.  Electronically Signed    LL/MEDQ  D:  12/02/2007  T:  12/02/2007  Job:  [id number]   cc:   Delaney Meigs, M.D.

## 2011-03-24 NOTE — Op Note (Signed)
[name], [name] [name]              ACCOUNT NO.:  [id number]   MEDICAL RECORD NO.:  [id number]          PATIENT TYPE:  AMB   LOCATION:  DAY                           FACILITY:  [facility]   PHYSICIAN:  R. Roetta Sessions, M.D. DATE OF BIRTH:  09-16-1936   DATE OF PROCEDURE:  12/16/2007  DATE OF DISCHARGE:                                PROCEDURE NOTE   PROCEDURE:  Esophagogastroduodenoscopy with Elease Hashimoto dilation.   ENDOSCOPIST:  Jonathon Bellows, M.D.   INDICATIONS FOR PROCEDURE:  A 75 year old gentleman with recurrent  esophageal dysphagia secondary to Schatzki's ring.  He presented with  recent recurrent dysphagia.  Barium pill esophagram demonstrated  obstruction of the pill at the GE junction.  EGD with dilation is now  being done.  The approach has been discussed with the patient at length.  Potential risks, benefits and alternatives have been reviewed and  questions answered.  He is agreeable to this approach.  Please see  documentation in the medical record.   PROCEDURE NOTE:  O2 saturation, blood pressure, pulse and respirations  were monitor throughout the entire procedure.   CONSCIOUS SEDATION:  Versed 2 mg IV, Demerol 50 mg IV in divided doses.   INSTRUMENT:  Pentax video chip system.   FINDINGS:  Examination of the tubular esophagus revealed a Schatzki's  ring.  There was no esophagitis.  This ring appeared to be somewhat  tight, but the scope easily traversed the EG junction.   STOMACH:  The gastric cavity was emptied and insufflated well with air.  Thorough examination of the gastric mucosa including a retroflexion view  of the proximal stomach and esophagogastric junction demonstrated only a  moderate-sized hiatal hernia.  Pylorus patent and easily traversed.  Examination of the bulb and second portion revealed no abnormalities.   THERAPEUTIC/DIAGNOSTIC MANEUVERS PERFORMED:  Scope was withdrawn.  A 56-  French Maloney dilator was passed to full insertion with mild to  moderate resistance.  A look back revealed the ring had been nicely  ruptured without apparent complication.  The patient tolerated the  procedure well and was reactive after endoscopy.   IMPRESSION:  1. Prominent Schatzki's ring, otherwise normal esophagus, status post      dilation and disruption as described above.  2. Moderate-sized hiatal hernia, otherwise normal stomach, first      duodenum and second duodenum.   RECOMMENDATIONS:  1. Begin Prilosec 20 mg orally daily.  2. [name] is asked to wait until tomorrow to resume his Plavix and      aspirin.  He is to call if he has any future difficulties with      swallowing.      Jonathon Bellows, M.D.  Electronically Signed     RMR/MEDQ  D:  12/16/2007  T:  12/17/2007  Job:  [id number]   cc:   Delaney Meigs, M.D.  Fax: [phone]

## 2011-03-24 NOTE — Assessment & Plan Note (Signed)
[facility]                          [facility] NOTE   DELANEY, SCHNICK                     MRN:          [id number]  DATE:04/01/2009                            DOB:          08/05/1936    PRIMARY CARE PHYSICIAN:  Delaney Meigs, MD   REASON FOR VISIT:  Routine followup.   HISTORY OF PRESENT ILLNESS:  I saw Mr. Naval back in May 2009.  He  reports infrequent angina with rare use of sublingual nitroglycerin  since I last saw him.  His electrocardiogram today is relatively stable  showing sinus bradycardia at 57 beats per minute with decreased anterior  R-wave progression as before and nonspecific ST-T wave changes.  Lipids  have been followed by Dr. Lysbeth Galas on simvastatin.  Blood pressure is not  as well controlled today.  I note that he is not on hydrochlorothiazide  as before.  He does have chronic lower extremity edema.  Mr. Karnes is  limited by knee pain and does very little ambulation.  We did talk about  some type of low-level walking regimen today.  His weight is up to 214  pounds.   ALLERGIES:  No known drug allergies.   PRESENT MEDICATIONS:  1. Carvedilol 12.5 mg p.o. b.i.d.  2. Plavix 70 mg p.o. daily.  3. Lisinopril 30 mg p.o. b.i.d.  4. Simvastatin 40 mg p.o. at bedtime.  5. Aspirin 81 mg p.o. daily.  6. Amlodipine 10 mg p.o. daily.  7. Avodart 0.5 mg p.o. daily.  8. Protonix 40 mg p.o. daily.  9. Sublingual nitroglycerin 0.4 mg p.r.n.  10.MiraLax p.r.n.   REVIEW OF SYSTEMS:  Outlined above.  The patient does report nocturia,  usually 3 times.  No orthopnea or PND.  He has chronic lower extremity  edema.  No claudication.  Chronic arthritic knee pain.  Appetite has  been stable.  Otherwise reviewed and negative.   PHYSICAL EXAMINATION:  VITAL SIGNS:  Blood pressure 152/78, heart rate  is 58 and regular, weight 114 pounds.  GENERAL:  This is an obese male, chronically ill appearing, no acute  distress.  HEENT:  Conjunctiva is normal.  Oropharynx clear.  NECK:  Supple.  No elevated jugular venous pressure.  No thyromegaly.  LUNGS:  Diminished breath sounds.  Nonlabored breathing.  CARDIAC:  Regular rate and rhythm, 2/6 systolic murmur at the base.  No  S3 gallop.  ABDOMEN:  Obese, protuberant.  No obvious hepatomegaly.  Bowel sounds  present.  EXTREMITIES:  2+ edema below the knees, venous stasis.  Distal pulses  1+.  MUSCULOSKELETAL:  No kyphosis noted.  NEUROPSYCHIATRIC:  The patient is alert and oriented x3.   IMPRESSION AND RECOMMENDATIONS:  Ischemic cardiomyopathy with an  ejection fraction of 35% associated with multivessel cardiovascular  disease with 3/5 bypass grafts patent on angiography in January 2008.  There has been no progressive angina.  He had NYHA class II to III  dyspnea on exertion as well as worsening lower extremity edema.  I have  recommended that he resume hydrochlorothiazide at 12.5 mg daily.  This  can be up  titrated as necessary.  We will plan a followup BMET and BNP  over the next few weeks.  No additional ischemic studies will be  arranged at this particular time given his symptom stability.  We have  had discussions about defibrillators in the past and he is not  interested in pursuing this is an option.  Further plans to follow.     Jonelle Sidle, MD  Electronically Signed    SGM/MedQ  DD: 04/01/2009  DT: 04/02/2009  Job #: [id number]   cc:   Delaney Meigs, M.D.

## 2011-03-27 NOTE — Cardiovascular Report (Signed)
[name] [name], [name] [name]              ACCOUNT NO.:  [id number]   MEDICAL RECORD NO.:  [id number]          PATIENT TYPE:  OIB   LOCATION:  1961                         FACILITY:  [facility]   PHYSICIAN:  Veverly Fells. Excell Seltzer, MD  DATE OF BIRTH:  12/17/1935   DATE OF PROCEDURE:  DATE OF DISCHARGE:                            CARDIAC CATHETERIZATION   PROCEDURE:  Left heart catheterization, selective coronary angiography,  left ventricular angiography, saphenous vein graft angiography, LIMA  angiography.   INDICATIONS:  Mr. Birdsell is a 75 year old male with known coronary  artery disease.  He is status post prior  multivessel coronary bypass  surgery as well as percutaneous coronary intervention.  He has had  progressive symptoms of fatigue and shoulder pain which is thought to be  his anginal equivalent.  He has also had progressive reduction in his  left ventricular function.  He was subsequently referred for cardiac  catheterization to evaluate for progressive coronary artery disease as  cause of his ongoing pain and LV dysfunction.   Procedural details, risks and indications of procedure were explained in  detail to the patient.  Informed consent was obtained.  The right groin  was prepped, draped and anesthetized with 1% lidocaine.  A 4-French  sheath was placed in the right femoral artery using a modified Seldinger  technique.  Multiple views of the left and right coronary arteries were  taken.  For the left coronary artery a 4-French JL-4 catheter was used.  For the right coronary artery a 4-French JR-4 catheter was used.  Following selective native vessel angiography, saphenous vein graft  angiography was performed.  The saphenous vein graft to the diagonal  branch was imaged using the JR-4 catheter.  The LIMA to LAD was then  imaged using the same catheter.  After  LIMA angiography, a right  coronary bypass catheter was inserted and used to image the saphenous  vein graft to the PDA.   Following bypass graft angiography an angled  pigtail catheter was inserted and placed in the left ventricle where  pressures were recorded.  The left ventriculogram was performed.  A  pullback across the aortic valve was done.   FINDINGS:  Aortic pressure 141/69 with a mean of 97, left ventricular  pressure 142/6 with an end-diastolic pressure of 16.   Coronary angiography:  The left main stem has no significant  angiographic disease.  The cranial views give the appearance of an  ostial stenosis but when the left mainstem is laid out properly there is  clearly no significant obstructive disease.  Left main stem bifurcates  into the LAD and left circumflex.   The LAD is a severely diseased vessel throughout its proximal portions.  There is a 75% ostial stenosis with mild calcification.  The proximal  portion of the LAD has an 80% stenosis after the first diagonal branch.  The mid and distal vessel fills via competitive flow from the graft.  There is a large diagonal branch that appears occluded.  It fills  compatible competitively via graft flow as well.   Left circumflex is diffusely diseased.  There is  an 85% proximal  stenosis.  It is a relatively small vessel that gives off two small  posterolateral branches.  There is an atrial branch that arises from the  mid left circumflex.   The native right coronary artery is a diffusely diseased vessel.  There  is a proximal 50% stenosis followed by a mid 75% stenosis and a distal  30% stenosis.  The right coronary artery terminates in a PDA and  posterior AV segment which gives off a posterolateral branch.  The PDA  fills via competitive flow from the graft.  The posterolateral segment  fills via native flow and is diffusely diseased without high-grade  stenoses present.   LIMA angiography:  The LIMA to mid LAD is widely patent.  The mid and  distal LAD are very small segments.  They appear diffusely diseased.  There is a faint  left-to-right collateral from the distal LAD.   Saphenous vein graft described is a sequence graft to the diagonal and  intermediate branch is patent to the diagonal.  The diagonal branch  appears to be a large vessel and there are 2 significant branches that  fill from the graft.  The sequence portion to the intermediate branch  appears to be occluded.  There is a stented segment that approaches the  intermediate branch and there is no flow proximal to the stent.  I  suspect there was a limb of the graft anastomosed to this area that is  now occluded.  The diagonal territory that is supplied by the vein graft  is widely patent as is the vein graft proper.   Saphenous vein graft sequence to the PDA and posterolateral segment is  patent to the PDA portion.  I do not visualize the PL limb of the graft.  The PDA is a very small vessel throughout and has a 90% stenosis just  beyond the graft.  This is less than a 1 mm diameter vessel.  The PL  segment fills in a retrograde fashion via the graft flow.   Left ventriculography performed in the 30 degrees right anterior oblique  projection shows moderately severe segmental left ventricular  dysfunction with an estimated left ventricular ejection fraction of 35%.  The mid inferior wall is severely hypokinetic and the anteroapical wall  is hypokinetic.  There is no significant mitral regurgitation  appreciated.   ASSESSMENT:  1. Severe native three-vessel coronary artery disease with severe      small vessel disease present.  2. Status post coronary bypass surgery with three of five patent      grafts.  My impression is that the vein graft at the main branches      of the vein grafts were patent but I do not see this sequenced limb      of fever the graft to the ramus intermedius or the posterolateral      branch of the right coronary artery.  3. Moderate to moderately severe left ventricular dysfunction with an     overall estimated left  ventricular ejection fraction of 35%.   RECOMMENDATIONS:  Unfortunately, Mr. Calo does not have any good  revascularization options to his severe small vessel disease.  Would  recommend continued medical therapy in an aggressive fashion.  Dr.  Tad Moore has been  titrating the patient's Coreg and he will continue to  require lifelong aggressive medical therapy.      Veverly Fells. Excell Seltzer, MD  Electronically Signed     MDC/MEDQ  D:  11/04/2006  T:  11/04/2006  Job:  [id number]

## 2011-03-27 NOTE — Assessment & Plan Note (Signed)
[facility]                            [facility]ICE NOTE   ADONIAS, DEMORE                     MRN:          [id number]  DATE:08/24/2006                            DOB:          January 31, 1936    PRIMARY CARDIOLOGIST:  Dr. Simona Huh, who is involved in this visit.   Mr. Geisinger is a 75 year old male with known coronary artery disease, last  seen here in our clinic in February 2006 by Arnette Felts, P.A.-C.   Cardiac history is notable for prior remote myocardial infarction treated  with balloon angioplasty of the right coronary artery in 1994.  He then  underwent 5 vessel coronary artery bypass grafting in April 2003 for  treatment of multivessel coronary artery disease.  His ejection fraction was  48% by cardiac catheterization at that time.   The patient then presented with recurrent chest pain, had an abnormal  adenosine stress test, suggestive of anterolateral/inferior ischemia, and  was referred for a repeat cardiac catheterization 3 months after his bypass  surgery.  He was found to have a significant disease of the sequential SVG-  first diagonal- ramus intermedius branch with otherwise patent lima-LAD and  SVG-PD-PL branch grafts.  He underwent subsequent percutaneous intervention  with balloon angioplasty and stenting.   The patient now presents with complaints of left shoulder and neck pain,  which he says is reminiscent of what he had prior to the intervention.  Of  note, however, he denies any chest pain and states that he has never had  chest discomfort.  His current symptoms have been going on for at least a  few months.  They are constant and not exacerbated by walking, activity, or  movement of the shoulder.  He also points out that if he sleeps on his left  side his pain is worse in the morning.   The patient is not very active and does not exercise regularly.  He has some  chronic exertional dyspnea, but with no  recent exacerbation.  He has not  smoked in over 50 years.   The patient also points out that he had recent up-titration of his  lisinopril, and that his blood pressure has reportedly been low during  recent followup.   Electrocardiogram today reveals NSR at 67 BPM with normal axis and a left  bundle branch block.   CURRENT MEDICATIONS:  1. Aspirin 81 q. day.  2. Plavix.  3. Zocor 20 q. day.  4. Coreg 6.25 b.i.d.  5. Isosorbide 30 q. day.  6. Ranitidine 300 q. day.  7. Lisinopril 30 b.i.d.   PHYSICAL EXAMINATION:  Blood pressure 178/102, pulse 67 and regular.  Weight  202.  GENERAL:  A 75 year old male sitting upright in no apparent distress.  NECK:  Palpable bilateral carotid pulses without bruits; no JVD.  LUNGS:  Diminished breath sounds throughout.  HEART:  Regular rate and rhythm (S1 and S2), no significant murmurs.  ABDOMEN:  Protuberant, nontender.  EXTREMITIES:  1+ pedal edema.  NEURO:  No focal deficits.   IMPRESSION:  1. Recurrent persistent left shoulder/neck pain.  a.     Not exacerbated by exertion/activity, or movement.      b.     Question anginal equivalent.  2. Coronary artery disease.      a.     Status post percutaneous intervention, saphenous vein graft -       first diagonal - ramus intermedius graft July 2003.      b.     A 5 vessel coronary artery bypass grafting April 2003:  Left       internal mammary artery - left anterior descending; saphenous vein       graft - first diagonal - ramus intermedius; saphenous vein graft -       posterior descending artery - postero-lateral artery.      c.     Ejection fraction of 40% by adenosine stress Cardiolite July       2003 (pre-intervention).  3. Left bundle branch block.      a.     Unknown duration.  4. Dyslipidemia.  5. Uncontrolled hypertension.  6. Obesity.   PLAN:  Mr. Baumgardner presents with symptoms that are not clearly suggestive of  an underlying ischemia.  His left shoulder discomfort has  been persistent  and is not exacerbated by walking or activity.  However, it also is not  worsened by any movement of the left shoulder and he feels that it is  reminiscent of symptoms he had prior to his last percutaneous intervention.   Mr. Reiber also presents with uncontrolled hypertension today, despite  having had recent up-titration of his ACE inhibitor with reportedly improved  followup blood pressure readings.   At this point, I have recommended that we proceed with a repeat  pharmacologic stress test given that he has not had any stress testing since  undergoing percutaneous intervention in July of 2003.  We will, however,  keep the low threshold for a repeat catheterization.  We will also up-  titrate Coreg to 12.5 b.i.d. for better blood pressure control.   We will arrange for Mr. Splawn to return to our clinic for close followup in  2 weeks for review of test results and further recommendations.      ______________________________  Rozell Searing, PA-C    ______________________________  Jonelle Sidle, MD    GS/MedQ  DD:  08/24/2006  DT:  08/25/2006  Job #:  [id number]   cc:   Delaney Meigs, M.D.

## 2011-03-27 NOTE — Op Note (Signed)
[name] [name], [name] [name]              ACCOUNT NO.:  [id number]   MEDICAL RECORD NO.:  [id number]          PATIENT TYPE:  AMB   LOCATION:  DAY                           FACILITY:  [facility]   PHYSICIAN:  R. Roetta Sessions, M.D. DATE OF BIRTH:  03/17/1936   DATE OF PROCEDURE:  02/24/2006  DATE OF DISCHARGE:                                 OPERATIVE REPORT   PROCEDURE:  Screening colonoscopy.   INDICATIONS:  The patient is a 75 year old Caucasian male sent over at the  courtesy of Dr. Joette Catching for colorectal cancer screening.  He has  never had his lower GI tract imaged.  There is no family history of first-  degree relatives with colon cancer__________  disease.  He is devoid of any  lower GI tract symptoms.  Colonoscopy is now being done to as a screening  maneuver.  This approach has been discussed with the patient at length.  The  potential risks, benefits and alternatives have been reviewed, questions  answered and he is agreeable.  Please see documentation in the medical  record.   DESCRIPTION OF PROCEDURE:  Oxygen saturation, blood pressure, pulse and  respiration were monitored throughout the entire procedure.  Conscious  sedation with Versed 3 mg IV and Demerol 75 mg IV in divided doses.  The  instrument was the Olympus video chip system.   FINDINGS:  Digital rectal exam revealed no abnormalities.   ENDOSCOPIC FINDINGS:  Prep was adequate.   Rectum:  Examination of the rectal mucosa including retroflexion in the anal  verge revealed no abnormalities.   Colon:  Colonic mucosa was surveyed from the rectosigmoid junction through  the left, transverse,  right colon to the area of the appendix, ileocecal  valve and cecum.  These structures were well seen and photographed for the  record.  From this level the scope was slowly and cautiously withdrawn.  All  previously mentioned mucosal surfaces were again seen.  The patient was  noted to have extensive left-sided diverticula.  The  remainder of the  colonic mucosa appeared normal.  The patient tolerated the procedure well  and was reactive to endoscopy.   IMPRESSION:  1.  Normal rectum.  2.  Left-sided diverticula.  3.  The remainder of the colonic mucosa normal.   RECOMMENDATIONS:  1.  Diverticulosis literature provided to Mr. Eulah Pont.  2.  Consider repeat screening colonoscopy in 10 years.      Jonathon Bellows, M.D.  Electronically Signed     RMR/MEDQ  D:  02/24/2006  T:  02/25/2006  Job:  [id number]   cc:   Delaney Meigs, M.D.  Fax: [phone]

## 2011-03-27 NOTE — Cardiovascular Report (Signed)
[facility]  Patient:    [name] [name], [name] [name] Visit Number: [id number] MRN: [id number]          Service Type: CAT Location: [id number] Attending Physician:  Jonelle Sidle Dictated by:   Jonelle Sidle, M.D. [facility] Proc. Date: 01/13/02 Admit Date:  01/13/2002   CC:         Colon Flattery, D.O.   Cardiac Catheterization  DATE OF BIRTH: [birth year]  PRIMARY CARE PHYSICIAN: Colon Flattery, D.O.  Corinda Gubler CARDIOLOGIST: Jonelle Sidle, M.D.  INDICATIONS: The patient is a 75 year old male with a past medical history of hypertension, dyslipidemia, and known coronary artery disease, status post inferior wall myocardial infarction in 1984, treated with urokinase and percutaneous transluminal coronary angioplasty to the right coronary artery. He is undergoing a DOT physical and had an abnormal Cardiolite suggestive of ischemia in the RCA distribution with an ejection fraction of 48%. He is referred for cardiac catheterization to define the coronary anatomy and left ventricular function.  PROCEDURES PERFORMED: 1. Left heart catheterization. 2. Selective coronary angiography. 3. Left ventriculography.  ACCESS AND EQUIPMENT: The area about the right femoral artery was anesthetized with 1% lidocaine. A 6 French sheath was placed in the right femoral artery via the modified Seldinger technique. Standard preformed 6 [place] and JR4 catheters were used for selective coronary angiography. A standard 6 French angled pigtail catheter was used for left heart catheterization and left ventriculography. The patient tolerated the procedure well without no obvious complications.  HEMODYNAMICS: Left ventricle 157/15 mmHg (postangiography). Aorta 157/82 mmHg.  ANGIOGRAPHIC FINDINGS: 1. The left main coronary artery has no evidence of significant flow-limiting    coronary atherosclerosis. 2. The left anterior descending is a small to medium caliber  vessel with    a complex 75% stenosis in the proximal segment. This involves the takeoff    of the first and second diagonal branches. The first diagonal branch has    a 50% ostial stenosis and the second diagonal branch has a 75% proximal    stenosis. 3. The circumflex coronary artery is a small vessel that has a 75%    proximal stenosis. 4. The right coronary artery is dominant and has a 25% proximal stenosis,    50% mid vessel stenosis, 40-50% distal stenosis, and an 80% stenosis    involving the posterior descending branch. There are left to right    collaterals that partially supply the distal RCA and PDA system    via the septal perforators.  LEFT VENTRICULOGRAM: The left ventriculogram reveals an ejection fraction estimated at 45-50% with inferior wall hypokinesis and trace mitral regurgitation.  DIAGNOSES: 1. Multivessel coronary artery disease as outlined above. 2. Left ventricular ejection fraction of 45-50% with inferior wall    hypokinesis consistent with previous myocardial infarction. The patient    also has trace mitral regurgitation.  PLAN: Will admit the patient overnight for hydration and start him on Coreg 3.125 mg p.o. b.i.d. Will continue additional medical regimen and plan to [facility] [place] next week for a groin check. We will need to arrange the patient to see CVTS to discuss coronary artery bypass grafting as this seems most appropriate in this patients case. Dictated by:   Jonelle Sidle, M.D. [facility] Attending Physician:  Jonelle Sidle DD:  01/13/02 TD:  01/14/02 Job: [id number]

## 2011-03-27 NOTE — Assessment & Plan Note (Signed)
[facility]                          [facility] OFFICE NOTE   [name] [name], [name] [name]                     MRN:          [id number]  DATE:11/18/2006                            DOB:          [birth year]    Primary cardiologist:  Dr. Simona Huh   REASON FOR VISIT:  Post-catheterization followup.   Patient presents in followup after undergoing elective cardiac  catheterization on December 27, by Dr. Tonny Bollman, for further  evaluation of left shoulder/neck pain in the setting of known coronary  artery disease and a recent abnormal adenosine Cardiolite.  Please refer  to my previous office notes of October 16 and November 23 for full  details.   Dr. Excell Seltzer found a severe native 3 vessel CAD with severe small vessel  disease, and 3/5 patent bypass grafts.  LV function was moderately  depressed (EF 35%).  Dr. Excell Seltzer felt that there were no good  revascularization options secondary to the severe small vessel disease,  and recommended continued aggressive medical therapy.   Since last seen here in the clinic, patient feels that the neck/left  shoulder discomfort has abated somewhat.  As before, he denies any  interior chest discomfort.   Of note, patient apparently was briefly hospitalized at [facility]  (November 30-December 3) since our last clinic visit.  As he tells me  today, he presented to [facility] ER on the morning of scheduled cardiac  catheterization with severe left lower quadrant abdominal pain.  He was  subsequently diagnosed and treated for acute sigmoid diverticulitis.   CURRENT MEDICATIONS:  1. Aspirin 81 daily.  2. Plavix.  3. Zocor 20 daily.  4. Imdur 60 daily.  5. Lisinopril 30 b.i.d.  6. Ranitidine 300 daily.  7. Coreg 12.5 b.i.d.  8. Norvasc 5 daily.   PHYSICAL EXAMINATION:  Blood pressure 156/90, pulse 64, regular, weight  205.  GENERAL:  A 75 year old male, sitting upright, no distress.  NECK:  Palpable  bilateral carotid pulse without bruits.  LUNGS:  Diminished breath sounds at bases, but without crackles or  wheezes.  HEART:  Regular rate and rhythm (S1, S2).  No significant murmur.  ABDOMEN:  Soft, nontender, intact bowel sounds.  EXTREMITIES:  Right groin stable with no hematoma/ecchymosis or bruit in  auscultation, palpable femoral pulse.  1 to 2+ bilateral pretibial edema  with palpable posterior tibialis pulses.   IMPRESSION:  1. Ischemic cardiomyopathy.      a.     Ejection fraction 35% by recent catheterization.      b.     Severe native 3-vessel coronary artery disease with 3/5       patent bypass grafts, and severe small vessel disease.  Medical       therapy recommended.      c.     Status post 5-vessel coronary artery bypass grafting, April       2003 (left internal mammary artery-left anterior descending,       saphenous vein graft-diagonal 1-RI, saphenous vein graft-posterior       descending artery-PLA).      d.  Status post stent, saphenous vein graft-diagonal 1-RI, July       2003.  2. Hypertension.  3. Left bundle branch block.  4. Dyslipidemia.  5. Obesity.   PLAN:  1. Up titrate Coreg to 18.75 b.i.d. for continued aggressive medical      management.  2. Reassess left ventricular function with an echocardiogram in 3      months.  3. Schedule return clinic, follow up with Dr. Simona Huh in 3 months      for review of echocardiogram results and further recommendations.      [name] Serpe, PA-C  Electronically Signed      Learta Codding, MD,FACC  Electronically Signed   GS/MedQ  DD: 11/18/2006  DT: 11/18/2006  Job #: [id number]   cc:   Delaney Meigs, M.D.

## 2011-03-27 NOTE — H&P (Signed)
[place]  Patient:    [name] [name], MUZZY Visit Number: [id number] MRN: [id number]          Service Type: MED Location: MICU 2112 [id number] Attending Physician:  Colon Branch Dictated by:   Noralyn Pick Eden Emms, M.D. [facility] Admit Date:  05/06/2002                           History and Physical  HISTORY OF PRESENT ILLNESS:  [name] was transferred from Dr. Deitra Mayo office today for chest pain.  He has had two to three weeks of left shoulder blade pain that seems to have begun after he was working on his car.  It seems somewhat isolated to the left shoulder and is now anginal-sounding in nature. The patient has had recent coronary artery bypass grafting on February 13, 2002, with vein graft to the diagonal intermediate, vein graft to the PD, PLA.  He had an old inferior wall MI, treated with urokinase and PTCA back in 1984. His other coronary risk factors include hyperlipidemia, hypertension.  He is status post left nephrectomy secondary to stones.  The patient has not had any other associated palpitations or presyncope.  He has had a bit of exertional dyspnea.  Dr. Lysbeth Galas felt that there were some minor EKG changes and referred him to [facility].  MEDICATIONS:  An aspirin a day, Lipitor 10 mg a day, Coreg 3.125 mg b.i.d., Protonix 40 mg a day, lisinopril 20 mg a day.  SOCIAL HISTORY:  He lives in [place] and currently does not smoke.  REVIEW OF SYSTEMS:  Unremarkable.  There is no arthritic or musculoskeletal-type pains.  PHYSICAL EXAMINATION:  VITAL SIGNS:  Blood pressure is 150/80, pulse 88 and regular.  GENERAL:  He looks well.  NECK:  Carotids are normal.  CHEST:  Clear.  His sternum appears to be well-healed.  There is some induration at the bottom of his sternal wound scar.  CARDIAC:  There is an S1, S2 without murmur, rub, gallop, or click.  ABDOMEN:  Benign.  EXTREMITIES:  Intact pulses, no edema.  LABORATORY DATA:   Remarkable for normal coagulation studies.  His first set of enzymes is negative.  His creatinine is 1. 3.  Hematocrit is stable.  His chest x-ray is pending.  His EKG shows sinus rhythm with nonspecific ST-T wave changes and probable LVH.  IMPRESSION:  The patients pain is atypical for angina, particularly this soon after coronary artery bypass grafting.  I think that it would be reasonable to proceed with a spiral CT to check his sternum to rule out PE and any other musculoskeletal problems in his chest.  I think if this is negative, we can treat him with nonsteroidals.  We will continue his Protonix, since he does appear to have some indigestion and GERD.  The patient then may be able to have an outpatient Cardiolite study. Dictated by:   Noralyn Pick Eden Emms, M.D. [facility] Attending Physician:  Colon Branch DD:  05/06/02 TD:  05/08/02 Job: [id number]

## 2011-03-27 NOTE — Op Note (Signed)
. [facility]  Patient:    [name] [name], [name] [name] Visit Number: [id number] MRN: [id number]          Service Type: SUR Location: 2300 2399 [id number] Attending Physician:  Charlett Lango Dictated by:   Salvatore Decent Dorris Fetch, M.D. Proc. Date: 02/13/02 Admit Date:  02/13/2002   CC:         Colon Flattery, D.O.  Simona Huh, M.D.   Operative Report  PREOPERATIVE DIAGNOSIS:  Severe three-vessel coronary disease with impaired left ventricular function.  POSTOPERATIVE DIAGNOSIS:  Severe three-vessel coronary disease with impaired left ventricular function.  OPERATION PERFORMED:  Median sternotomy, extracorporeal circulation.  Coronary artery bypass grafting times five (left internal mammary artery to left anterior descending, sequential saphenous vein graft to the first diagonal and ramus intermedius, sequential saphenous vein graft to the posterior descending and posterolateral).  SURGEON:  Salvatore Decent. Dorris Fetch, M.D.  ASSISTANT: 1. Dominica Severin, P.A. 2. Maxwell Marion, RNFA  ANESTHESIA:  General.  OPERATIVE FINDINGS:  Left ventricular hypertrophy, small fair to poor  quality targets.  Good quality conduits.  INDICATIONS FOR PROCEDURE:  The patient is 75 year old gentleman who was having a physical examination including cardiac work-up.  It also included a Cardiolite study which was positive.  On questioning, the patient did relate exertional shortness of breath and right-sided chest pain.  He underwent cardiac catheterization which revealed severe three-vessel coronary disease with diffusely diseased vessels.  His ejection fraction was 45%.  He was referred for coronary artery bypass grafting.  The indications, risks, benefits and alternative procedures were discussed in detail with the patient and he understood and accepted the risks and agreed to proceed.  DESCRIPTION OF PROCEDURE:  The patient was brought to the preop holding area on February 10, 2002.  Lines were placed to monitor arterial, central venous and pulmonary arterial pressure.  EKG leads were placed for continuous telemetry. The patient was taken to the operating room, anesthetized and intubated.  A Foley catheter was placed.  Intravenous antibiotics were administered.  The chest, abdomen and legs were prepped and draped in the usual fashion.  A median sternotomy was performed.  Simultaneously an incision was made in the medial aspect of the right leg and the greater saphenous vein was harvested from the ankle to the lower thigh.  It was of good quality.  The left internal mammary artery was harvested using standard technique.  The mammary was relatively small in caliber, approximately 1.5 mm in diameter but appeared of good quality.  The patient was fully heparinized prior to dividing the distal end of the mammary artery.  There was good flow through the cut ends of the vessel.  The mammary was placed in a papaverine soaked sponge and placed into the left pleural space.  The pericardium was opened.  The ascending aorta was inspected and palpated. There was no atherosclerotic disease.  The aorta was cannulated via concentric 2-0 Ethibond pledgeted pursestring sutures.  A dual stage venous cannula was placed via a pursestring suture in the right atrial appendage. Cardiopulmonary bypass was instituted and the patient was cooled to 32 degrees Celsius.  The coronary arteries were inspected and anastomotic sites were chosen.  The conduits were inspected and cut to length.  A foam pad was placed in the pericardium to protect the the left phrenic nerve.  A temperature probe was placed in the myocardial septum and the cardioplegia cannula was placed in the ascending aorta.  The aorta was crossclamped.  The left ventricle  was emptied via the aortic root vent.  Cardiac arrest then was achieved with a combination of cold antegrade blood cardioplegia and topical iced saline.   After achieving a complete diastolic arrest and myocardial septal temperature of 11 degrees Celsius, the following distal anastomoses were performed.  First a reversed saphenous vein graft was placed sequentially to the posterior descending and posterolateral branches of the right coronary artery.  The posterior descending was a 1.5 diameter diffusely diseased vessel but only a 1 mm probe would pass distally.  It was of poor quality.  The anastomosis was performed off a side branch of the vein graft using a running 7-0 Prolene suture.  All anastomoses were probed proximally and distally after their completion to ensure patency.  There was adequate flow through the anastomosis.  An end-to-side anastomosis then was constructed to the posterolateral branch of the right coronary artery which was a 1 mm fair quality target.  Cardioplegia was administered through the graft.  There was good hemostasis at both anastomoses.  Next a reversed saphenous vein graft was placed sequentially to the first diagonal and ramus intermedius.  The first diagonal was 1.5 at the site of the anastomosis but only 1 mm distally.  It was of fair quality.  Side-to-side anastomosis was performed with a running 7-0 Prolene suture.  There was adequate flow through this anastomosis.  An end-to-side anastomosis then was constructed to the ramus intermedius which was a 1.5 mm fair quality intramyocardial vessel.  This anastomosis also was performed with a running 7-0 Prolene suture.  Of note, the distal circumflex was seen and was too small to graft.  There was good flow through this graft.  Cardioplegia was administered.  There was a small leak from the first diagonal anastomosis which was repaired with a single 7-0 Prolene suture.  The left internal mammary artery then was brought through a window in the pericardium.  The distal end of the mammary artery was spatulated.  It was  anastomosed end-to-side to the LAD.  The  LAD was a 1.5 mm fair quality target and again only a 1 mm probe would pass distally because of diffuse disease. The mammary was a 1.5 mm good quality conduit.  The anastomosis was performed with a running 8-0 Prolene suture.  At the completion of the mammary to LAD anastomosis the bulldog clamps were briefly removed to inspect for hemostasis. Immediate and rapid septal rewarming occurred.  The bulldog clamp was replaced.  The mammary pedicle was tacked to the epicardial surface of the heart with 6-0 Prolene sutures.  Additional cardioplegia then was administered.  The vein grafts were cut to length and the proximal vein graft anastomoses were performed to 4.0 mm punch aortotomies with running 6-0 Prolene sutures.  At the completion of the final proximal anastomosis, the patient was placed in Trendelenburg position.  Deairing maneuvers were performed.  The bulldog clamp was removed from the mammary artery.  The aortic crossclamp was released.  The total crossclamp time was 83 minutes. The patient spontaneously resumed a rhythm and did not require defibrillation.  All proximal and distal anastomoses were inspected for hemostasis.  Epicardial pacing wires were placed on the right ventricle and right atrium.  When the core temperature had been rewarmed to 37 degrees Celsius, the patient was weaned from cardiopulmonary bypass.  He was in sinus rhythm with no inotropic support at the time of separation from bypass.  Initial cardiac index was less than 2L per minute per meter squared  but the patient responded appropriately to volume administration and was hemodynamically stable thereafter.  A test dose of protamine was administered and was well tolerated.  The atrial and aortic cannulae were removed.  There was good hemostasis at both cannulation sites.  The remainder of the protamine was administered without incident.  The chest was irrigated with 1L of warm normal saline containing 1 gm  of vancomycin.  Hemostasis was achieved.  The pericardium could not be reapproximated .  The mediastinal fat was brought together over the heart to prevent adhesions to the sternum.  A left pleural and two mediastinal chest tubes were placed through separate subcostal incisions.  The sternum was closed with heavy gauge interrupted stainless steel wires.  The pectoralis fascia was closed with running #1 Vicryl suture.  The subcutaneous tissues were closed with running 2-0 Vicryl suture and the skin was closed with a 3-0 Vicryl subcuticular suture.  All sponge, needle and instrument counts were correct at the end of the procedure.  The patient remained hemodynamically stable and was taken from the operating room to the surgical intensive care unit.  The patient is a questionable candidate for redo grafting. Dictated by:   Salvatore Decent Dorris Fetch, M.D. Attending Physician:  Charlett Lango DD:  02/13/02 TD:  02/14/02 Job: [id number]

## 2011-03-27 NOTE — Op Note (Signed)
[name] [name], [name] [name]              ACCOUNT NO.:  [id number]   MEDICAL RECORD NO.:  [id number]          PATIENT TYPE:  AMB   LOCATION:  DAY                           FACILITY:  [facility]   PHYSICIAN:  R. Roetta Sessions, M.D. DATE OF BIRTH:  [birth year]   DATE OF PROCEDURE:  03/18/2006  DATE OF DISCHARGE:                                 OPERATIVE REPORT   EGD with Elease Hashimoto dilation.   INDICATIONS FOR PROCEDURE:  The patient is a 75 year old gentleman who has  esophageal dysphagia after intermittent transient food impactions,  occasional reflux symptoms well controlled on ranitidine 300 mg at bedtime.  EGD is now being done and potential for esophageal dilation has been  reviewed.  Potential risks, benefits and alternatives have been discussed  with the patient, please see the documentation in medical record.   PROCEDURE NOTE:  The O2 saturation, blood pressure, pulse and respirations  were monitored throughout entire procedure.   CONSCIOUS SEDATION:  1.  Versed 3 mg IV.  2.  Demerol 50 mg IV.   INSTRUMENT:  Olympus video chip system.   FINDINGS:  Examination tubular esophagus revealed a Schatzki's ring.  The  esophageal mucosa otherwise appeared normal.  The esophagus was somewhat  dilated and baggy diffusely; however, he did have a ring.  EG junction was  easily traversed.  The remaining stomach, colon, gastric cavity was emptied  and insufflated well with air.  A thorough examination of gastric mucosa,  including retroflexion via the proximal stomach, esophagogastric junction,  demonstrated a moderate sized hiatal hernia; otherwise, gastric mucosa  appeared normal.  Pylorus was patent and easily traversed.  Examination of  the bulb and second portion revealed no abnormalities.   THERAPEUTIC DIAGNOSTIC MANEUVERS PERFORMED:  A 58 French Maloney dilator was  passed to full insertion with ease.  __________ revealed there had been  rupture without apparent complication.  Patient tolerated  the procedure  well, was reactive to endoscopy.   IMPRESSION:  1.  Somewhat dilated esophagus with Schatzki's ring status post dilation, as      described above; otherwise, normal esophagus.  2.  Moderate sized hiatal hernia; otherwise, normal stomach.  3.  Normal D1, D2.   RECOMMENDATIONS:  1.  Continue ranitidine 300 mg at bedtime.  2.  [name] [name] is to let me know if he does not enjoy significant      improvement in swallowing function.      Jonathon Bellows, M.D.  Electronically Signed     RMR/MEDQ  D:  03/18/2006  T:  03/19/2006  Job:  [id number]   cc:   Delaney Meigs, M.D.  Fax: [phone]

## 2011-03-27 NOTE — Discharge Summary (Signed)
[place]. [facility]  Patient:    [name] [name], [name] [name] Visit Number: [id number] MRN: [id number]          Service Type: SUR Location: 2000 2016 01 Attending Physician:  Charlett Lango Dictated by:   Maxwell Marion, RNFA Admit Date:  02/13/2002 Discharge Date: 02/18/2002   CC:         Jonelle Sidle, M.D. [facility]  Colon Flattery, M.D.   Discharge Summary  DATE OF BIRTH: October 24, 1936  ADMISSION DIAGNOSIS: Severe three vessel coronary artery disease.  PAST MEDICAL HISTORY: 1. Coronary artery disease, status post MI and PTCA in 1983. 2. Hypertension. 3. Hypercholesterolemia. 4. History of peptic ulcer disease. 5. Status post left nephrectomy for stones. 6. History of right nephrolithotomy.  ALLERGIES: None known.  DISCHARGE DIAGNOSIS: Severe three vessel coronary artery disease, status post coronary artery bypass grafting.  HISTORY OF PRESENT ILLNESS: [name] [name] is a 75 year old Caucasian man. 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 Findings at that time were UA positive for proteinuria and it was also noted that he had not had a cardiac evaluation for several years. He followed up with a cardiologist and underwent a Cardiolite stress test which was positive for ischemia in the right coronary artery distribution. Dr. Diona Browner then performed a cardiac catheterization which revealed severe three vessel coronary artery disease and impaired left ventricular function (ejection fraction estimated to be 45-50%). [name] [name] initially stated that he was asymptomatic but on further questioning, he revealed some shortness of breath and chest tightness when he walked upstairs or up inclines.  He was evaluated by Dr. Charlett Lango at the [facility] office on February 01, 2002. After exam of the patient and review of the records, Dr. Dorris Fetch recommended coronary artery bypass grafting  as the preferred treatment choice of this gentleman.  PROCEDURE: Risks and benefits including his increased risk for kidney dysfunction due to his having one kidney were all discussed with the patient and he agreed to proceed. Doppler studies were performed at the [facility] office also on February 01, 2002 and revealed no significant carotid artery disease. He was noted to have palpable distal pulses bilaterally.  HOSPITAL COURSE: On February 13, 2002, [name] [name] was admitted to [facility] under the care of Dr. Charlett Lango. He had the following surgical procedure. Coronary artery bypass grafting times 5.  Grafts placed at the time of the procedure were left internal mammary artery grafted to the left anterior descending artery, saphenous veins grafted in sequential fashion to the diagonal and ramus arteries, saphenous veins grafted in sequential fashion to the posterior descending and posterior lateral arteries. The vein was harvested for the bypass graft from the right lower leg. [name] [name] tolerated this procedure well and was transferred in stable condition to the SICU.  [name] [name] has remained hemodynamically stable since surgery. His postoperative course has been notable only for a mild increase in his creatinine. On February 15, 2002, it was measured at 1.5, his baseline was 1.2. Throughout this time, he maintained good urine output. Creatinine remeasured on February 17, 2002 was 1.4. Urinalysis did reveal WBCs and leukocytes so he was started on Cipro for a five day course starting February 14, 2002. Overall, [name] [name] is making very good progress and recovering from his surgery. His incisions are healing well and he is ambulating without difficulty. He is tolerating a regular diet. His bowel and bladder function are  within normal limits for him.  DISPOSITION: This patient will be ready for discharge home tomorrow, February 18, 2002.  RECENT LABORATORY: On February 15, 2002 revealed WBC  8.6, hemoglobin 11.1, hematocrit 31.0, platelets 118. On February 17, 2002 sodium was 138, potassium 4.4, chloride 104, CO2 31, BUN 17, creatinine 1.4, glucose 103, and calcium 8.3.  DISCHARGE CONDITION: Improved.  ACTIVITY: He has been instructed to do no driving, lifting, pushing, or pulling of anything more than 10 lb at this time. He has been instructed to continue his breathing exercises and daily walks.  DIET: Low fat, low salt.  WOUND CARE: He may shower at home. He has been instructed to clean his wounds with mild soap and water. He has also been instructed to call the office if wound problems arise, as noted on the cardiac surgery fact sheet that he will be given prior to discharge.  DISCHARGE MEDICATIONS: 1. Tylox 1-2 p.o. q.4-6h. p.r.n. pain. 2. Lasix 40 mg p.o. q.d. X7 days. 3. Potassium chloride 20 meq p.o. q.d. X7 days. 4. Protonix 40 mg p.o. q.d. 5. Enteric coated aspirin 325 mg p.o. q.d. 6. Coreg 3.125 mg p.o. q.12h. 7. Lisinopril 20 mg p.o. q.d. 8. Lipitor 20 mg p.o. q.d.  FOLLOW-UP: 1. He has an appointment to see Dr. Diona Browner at the [facility] office on March 03, 2002 at 11:45 in the morning. He will have a chest x-ray taken at that    time. 2. He also has an appointment to see Dr. Charlett Lango at the [facility] office    on Mar 15, 2002 at 9:30 in the morning. Dictated by:   Maxwell Marion, RNFA Attending Physician:  Charlett Lango DD:  02/17/02 TD:  02/18/02 Job: [id number]

## 2011-03-27 NOTE — Assessment & Plan Note (Signed)
[facility]                          [facility] OFFICE NOTE   PLUMMER, MATICH                     MRN:          [id number]  DATE:02/16/2007                            DOB:          January 10, 1936    PRIMARY CARE PHYSICIAN:  Dr. Joette Catching.   REASON FOR VISIT:  Followup cardiomyopathy.   HISTORY OF PRESENT ILLNESS:  [name] [name] was seen in the office back in  January by Rozell Searing, PAC.  He reports doing well overall.  He is not  describing any progressive anginal symptoms and has stable NYHA class II  dyspnea on exertion.  He walks his small dog approximately 1/4 of a mile  twice a day and tolerates this well.  His Coreg was advanced at the last  visit, and an echocardiogram was planned, although Mr. Harrison did not  follow through with this.  Actually, we had a discussion today about the  possibility of defibrillator placement given his ischemic  cardiomyopathy, and he is not interested in pursuing this option at all.   ALLERGIES:  No known drug allergies.   PRESENT MEDICATIONS:  1. Aspirin 81 mg p.o. daily.  2. Zocor 20 mg p.o. nightly.  3. Plavix 75 mg p.o. daily.  4. Imdur 60 mg p.o. b.i.d.  5. Ranitidine 300 mg p.o. daily.  6. Lisinopril 30 mg p.o. b.i.d.  7. Norvasc 5 mg p.o. daily.  8. Coreg 18.75 mg p.o. b.i.d.   REVIEW OF SYSTEMS:  As described in the history of present illness.   PHYSICAL EXAMINATION:  Blood pressure is 141/77.  Heart rate is 56.  Weight is 208 pounds, which is up 3 pounds from his last visit.  Patient is comfortable and in no acute distress.  HEENT:  Conjunctivae are normal, oropharynx is clear.  NECK:  Supple without elevated jugular venous pressure without bruits.  No thyromegaly is noted.  Lungs are clear without labored breathing at rest.  CARDIAC EXAM:  Reveals a regular rate and rhythm without loud murmur or  S3 gallop.  The abdomen is soft, nontender, nondistended.  Extremities exhibit 1+  edema bilaterally below the knees.  Pulses are 1+  peripherally.  SKIN:  Warm and dry.  MUSCULOSKELETAL:  No kyphosis is noted.  NEURO/PSYCHIATRIC:  Patient is alert and oriented x3.   IMPRESSION/RECOMMENDATION:  1. Ischemic cardiomyopathy with an ejection fraction of 35% with      severe multivessel coronary artery disease and 3 of 5 patent bypass      grafts based on angiography in January of this year.  Medical      therapy was recommended at that time given relatively small vessel      disease, and at this point will plan to continue his present      regimen, except for an increase in Norvasc to 10 mg daily for      better control of hypertension.  We will not pursue an      echocardiogram at this particular point as I am not certain that it      will change our approach.  He is  not interested in device therapy.      Clinically, he has remained stable.  2. Routine followup over the next 6 months.     Jonelle Sidle, MD  Electronically Signed    SGM/MedQ  DD: 02/16/2007  DT: 02/16/2007  Job #: [id number]   cc:   Delaney Meigs, M.D.

## 2011-03-27 NOTE — H&P (Signed)
[name] [name], KAZMIERSKI              ACCOUNT NO.:  [id number]   MEDICAL RECORD NO.:  [id number]          PATIENT TYPE:  INP   LOCATION:  A223                          FACILITY:  [facility]   PHYSICIAN:  Thomasenia Bottoms, MDDATE OF BIRTH:  1935-11-10   DATE OF ADMISSION:  10/08/2006  DATE OF DISCHARGE:  LH                              HISTORY & PHYSICAL   CHIEF COMPLAINT:  Abdominal pain.   HISTORY OF PRESENT ILLNESS:  Mr. Whidbee is a 75 year old gentleman who  started having left lower quadrant abdominal pain at home.  He thought  maybe it was gas.  He had no relief from Tums.  It seemed to go away a  little but it always came back.  He was unable to get the pain any  better even with pain medication.  The pain was not exacerbated by  position or exertion.  It just stayed there.  He said he had at least 2  bowel movements over yesterday while he was having the pain.  No bright  red blood per rectum.  No diarrhea.  The patient does have a history of  constipation and has been taking his stool softeners intermittently.  He  did not vomit any blood.  No trouble with severe heartburn.  No fevers.  No chills.  No pain in his chest.   PAST MEDICAL HISTORY:  1. Coronary artery disease.      a.     Had bypass surgery five years ago.      b.     He says he failed a stress test just two weeks ago and was       actually scheduled for a cardiac catheterization this morning.  2. He has a history of hypertension.  3. Nephrolithiasis.  4. History of knee surgery as well.   SOCIAL HISTORY:  He does not smoke cigarettes, drink alcohol, or use any  illicit drugs.  He does chew tobacco but he says not very much, less  than one pack every 3 days.   FAMILY HISTORY:  Father died of coronary artery disease.  He is not  aware of any cancer in his family.   MEDICATIONS:  On arrival include:  1. Ranitidine 300 mg p.o. daily.  2. Lisinopril 20 mg p.o. twice daily.  3. Simvastatin 20 mg p.o. daily.  4.  Plavix 75 mg p.o. daily.  5. Isosorbide mononitrate 30 mg daily.  6. Coreg 12.5 mg p.o. twice a day.  7. Amlodipine 5 mg p.o. daily.  8. Baby aspirin, enteric-coated, 81 mg p.o. daily.   REVIEW OF SYSTEMS:  CONSTITUTIONAL:  He denies any weight loss or night  sweats.  His appetite is excellent.  HEENT:  He denies any severe  headaches.  No double vision.  He does have a whistle sound in his ears  frequently.  No sore throat or difficulty swallowing.  GI:  No diarrhea,  has not vomited blood or seen blood in his stool.  He does have trouble  with chronic constipation and has left lower quadrant pain at this time  as mentioned above.  CARDIOVASCULAR:  He denies any chest pain.  He was  having some difficulty with some shortness of breath with exertion.  He  denies lower extremity edema or orthopnea.  RESPIRATORY:  No cough.  No  hemoptysis.  MUSCULOSKELETAL:  He does have joint pains in multiple  joints.  NEUROLOGIC:  No seizures.  No asymmetric weakness.  No  paresthesias.  INTEGUMENTARY:  No open lesions or rashes.   VITAL SIGNS:  In the emergency department, his temp is 98.5, blood  pressure 144/86, pulse 78, respiratory rate 18, O2 sat is 97% on room  air.   PHYSICAL EXAMINATION:  GENERAL:  The patient is in no acute distress.  HEENT:  Normocephalic atraumatic.  Pupils are equal, round.  His sclerae  are nonicteric.  Oral mucosa moist.  NECK:  Supple.  No lymphadenopathy.  No thyromegaly.  No jugular venous  distention.  CARDIAC:  Regular rate and rhythm.  He has a well healed midline scar  over his sternum.  LUNGS:  Clear to auscultation with no wheezes, rhonchi, or rales.  ABDOMEN:  Soft.  He does have normoactive bowel sounds.  He has mild  tenderness in the left lower quadrant with no rebound or guarding.  EXTREMITIES:  Reveal no evidence of clubbing, cyanosis, or edema.  His  DP pulses are not palpable but his skin is warm and dry.  NEUROLOGIC:  He is alert and oriented x3.   His cranial nerves II-XII are  grossly intact.  He has no slurred speech.  He has 5/5 strength in his  upper and lower extremities.  His sensory exam is intact grossly in the  upper and lower extremities.  He has normal muscle tone and bulk.  SKIN:  Intact with no open lesions or rashes.  MUSCULOSKELETAL:  Reveals good range of motion with no clear evidence of  effusions of his joints.   DATA:  His white count is 11.1, hemoglobin 15.7, hematocrit 45.8,  platelet count is 226.  Sodium is 136, potassium 4.5, chloride 101,  bicarb 27, glucose 111, BUN 13, creatinine 1.4.  His lipase is 29.  Total bili 1.  Alk phos 89.  ALT 19, AST 19.  Urinalysis essentially is  unremarkable.   In the emergency department, the patient had a CT scan of his abdomen  and pelvis.  The CT scan revealed diverticulitis at the junction with  the descending colon and the sigmoid colon.  He has a mild to moderate  paraesophageal hernia.  He has a 3-cm renal lesion which is poorly  described, and he is status post left nephrectomy.  He also has  significant prostate enlargement noted on the CT.   ASSESSMENT/PLAN:  1. New onset diverticulitis.  The plan is to put the patient on      Levaquin and Flagyl and keep him nothing by mouth.  He is not      severely tender.  He is afebrile and his white count is normal.  He      hopefully will recover quickly but we will monitor him in the      hospital tonight.  2. Coronary artery disease.  The patient's cardiac catheterization      will need to be postponed but I will continue all of his cardiac      medications including his Plavix and aspirin given his current      status.  He has no symptoms of unstable angina.  He is not having  any chest pain at this time, even with mild exertion nor is he      having shortness of breath with mild exertion as well.  We will      continue his higher dose of Coreg and again his     cardiac catheterization will need to be  rescheduled.  3. Hypertension.  We will continue with medications.  4. For the abdomen right kidney lesion on CT.  An ultrasound has been      done and the results are pending.      Thomasenia Bottoms, MD  Electronically Signed     CVC/MEDQ  D:  10/08/2006  T:  10/08/2006  Job:  [id number]   cc:   Delaney Meigs, M.D.  Fax: [phone]   Madaline Savage, M.D.  Fax: [phone]

## 2011-03-27 NOTE — Assessment & Plan Note (Signed)
[facility]                            [facility] OFFICE NOTE   [name], [name]                     MRN:          [id number]  DATE:10/01/2006                            DOB:          04/21/1936    PRIMARY CARDIOLOGIST:  [name] [name], M.D.   REASON FOR OFFICE VISIT:  Scheduled office followup.  Please refer to my  office note of October 16 for full details.  At that time, the patient  presented for scheduled followup with history of long-standing coronary  artery disease.  However, he presented with complaint of left-shoulder and  neck pain, which he said was reminiscent of what he had had prior to his  interventions.  He denied any chest pain and stated that these symptoms had  been going on for several months.  Of note, however, they were not  exacerbated by walking, activity or movement of the shoulder and stated that  the pain was worsened if he slept on his left side.   We agreed to proceed with a stress test, given that he had not had any  cardiac testing since undergoing stenting of the SVG-DX1-RI graft in July of  2003.  Of note, this was following five-vessel CABG in April of 2003 (LIMA-  LAD; SVG-DX1-RI; SVG-PDA-PL all.   The patient underwent percutaneous intervention following complaint of  atypical chest pain and an adenosine Cardiolite, suggestive of  anterolateral/inferior ischemia with calculated ejection fraction of 40%.   The recent repeat adenosine stress Cardiolite is, once again, abnormal with  a moderately large inferior/inferolateral defect, which is partially  reversible.  Moreover, LV function has decreased from 40% to the current 35%  with severe hypokinesis of the inferior/inferolateral wall.   I have reviewed these results with the patient at today's visit.  He seems  to feel that his shoulder discomfort has lessened somewhat since we  increased his Coreg to the current dose.  Nevertheless, he does  admit that  he just does not feel as well as he did following his bypass surgery, as  well as undergoing stenting in July of 2003.   CURRENT MEDICATIONS:  1. Plavix.  2. Aspirin 81 daily.  3. Zocor 20 daily.  4. Isosorbide 30 daily.  5. Lisinopril 30 b.i.d.  6. Ranitidine 300 daily.  7. Coreg 12.5 b.i.d.  8. Os-Cal.   PHYSICAL EXAMINATION:  VITAL SIGNS:  Blood pressure 180/90, pulse 64,  regular, weight 201.  GENERAL:  The patient is a 75 year old male, obese, sitting upright, in no  apparent distress.  HEENT:  Normocephalic, atraumatic.  NECK:  Palpable carotid pulses without bruits.  No JVD.  LUNGS:  Diminished breath sounds throughout, but no crackles or wheezes.  HEART:  Regular rate and rhythm (S1, S2), no significant murmurs.  ABDOMEN:  Protuberant, nontender, with intact bowel sounds.  EXTREMITIES:  1+ pedal edema.  NEUROLOGIC:  No focal deficit.   IMPRESSION:  1. Recurrent left-shoulder/neck discomfort.      a.     Worrisome for anginal equivalent.      b.     Abnormal adenosine  stress Cardiolite, suggestive of       inferior/inferolateral ischemia; EF 35% (EF 40% by stress Cardiolite,       July 2003).  2. Coronary artery disease.      a.     Status post stent, SVG-DX1-RI, July 2003.      b.     Residual patent LIMA-LAD and SVG-PDA-PLA grafts.      c.     Five-vessel CABG, April 2003.  3. Left bundle branch block, unknown duration.  4. Dyslipidemia.  5. Uncontrolled hypertension.  Coreg recently up-titrated.  6. Obesity.   PLAN:  Following review of the patient's clinical presentation, and in light  of his recent abnormal stress test with Dr. Andee Lineman, recommendation is to  proceed with a repeat catheterization and possible percutaneous  intervention.  This will be scheduled for the [facility], early next week.  The  patient is agreeable with this plan and the risks/benefits have been  discussed.   Regarding his current medication regimen, we will add Norvasc 5  daily for  better blood pressure control, as well as up-titrate Imdur to 60 daily in  the interim.  We will also request copies of recent lipid profile.      Gene Serpe, PA-C  Electronically Signed      Learta Codding, MD,FACC  Electronically Signed   GS/MedQ  DD: 10/01/2006  DT: 10/01/2006  Job #: [id number]   cc:   [name] [name], M.D.  [name] Sidle, MD

## 2011-03-27 NOTE — H&P (Signed)
[name] [name], [name] [name]              ACCOUNT NO.:  [id number]   MEDICAL RECORD NO.:  [id number]           PATIENT TYPE:   LOCATION:                                FACILITY:  [facility]   PHYSICIAN:  R. Roetta Sessions, M.D. DATE OF BIRTH:  05-30-1936   DATE OF ADMISSION:  DATE OF DISCHARGE:  LH                                HISTORY & PHYSICAL   CHIEF COMPLAINT:  Followup of procedure, difficulty swallowing.   PRIMARY CARE PHYSICIAN:  Delaney Meigs, M.D.   HISTORY OF PRESENT ILLNESS:  [name] [name] is a 75 year old gentleman who  recently had a screening colonoscopy on February 24, 2006.  He had left sided  diverticula but otherwise normal study.  He mentioned to Dr. Jena Gauss at the  time of procedure, that he was having problems swallowing.  He has chronic  gastroesophageal reflux disease and is on Ranitidine 300 mg each night  because his insurance company would not cover PPI therapy according to him.  For the last six months to a year, he has had problems swallowing certain  foods.  He describes having food become lodged in the esophagus and he has  to vomit this up.  This happens quite frequently.  He denies any nausea,  odynphagia, hematemesis, abdominal pain.  Occasionally, he gets constipated.  No melena or rectal bleeding.   CURRENT MEDICATIONS:  1.  Plavix 75 mg daily.  2.  Simvastatin 20 mg daily.  3.  Isosorbide 30 mg daily.  4.  Ranitidine 300 mg q.h.s.  5.  Coreg 6.25 mg b.i.d.  6.  Lisinopril 20 mg three daily.   ALLERGIES:  No known drug allergies.   PAST MEDICAL HISTORY:  1.  Hypertension.  2.  Gastroesophageal reflux disease.  3.  Hypercholesterolemia.  4.  Coronary artery disease, status post five-vessel coronary artery bypass      graft approximately five years ago.  5.  He reports having one kidney on the right.  His left kidney apparently      was removed for unclear reasons.  6.  He also had surgery to have a stone removed from his right kidney.   FAMILY HISTORY:   No first degree relatives with colorectal cancer.   SOCIAL HISTORY:  He is married and has six children.  He quit smoking 40  years ago.  He does chew tobacco occasionally.  He denies any alcohol use.   REVIEW OF SYSTEMS:  See HPI for GI.  CONSTITUTIONAL:  Denies any weight  loss.  CARDIOPULMONARY:  No chest pain or shortness of breath recently.   PHYSICAL EXAMINATION:  VITAL SIGNS:  Weight 198, height 5 foot 6 inches,  temp 98.1, blood pressure 162/88, pulse 74.  GENERAL:  A pleasant, moderately obese, Caucasian male in no acute distress.  SKIN:  Warm and dry.  No jaundice.  HEENT:  Conjunctivae are pink.  Sclerae are nonicteric.  Oropharyngeal  mucosa is moist and pink.  No lesions, erythema or exudate.  No  lymphadenopathy, thyromegaly.  CHEST:  Lungs are clear to auscultation.  CARDIAC:  Reveals a regular rate and  rhythm.  Normal S1, S2.  No murmurs,  rubs, or gallops.  ABDOMEN:  Obese but symmetrical.  Positive bowel sounds.  Soft.  Mild  diffuse abdominal tenderness to deep palpation.  No organomegaly or masses.  No rebound tenderness or guarding.  No abdominal bruits or hernias.  EXTREMITIES:  No edema.   IMPRESSION:  1.  [name] [name] is a 75 year old gentleman with a history of solid food      esophageal dysphagia.  He describes food impaction at times.  2.  He has  chronic gastroesophageal reflux disease, fairly well controlled      on Ranitidine.   The patient denies any abdominal pain on exam has mild diffuse tenderness,  unclear significance.  He has never had an upper endoscopy.  He may have an  esophageal web ring or stricture based on symptoms.   PLAN:  1.  EGD with esophageal dilatation in the near future.  2.  Hold Plavix for three days.  3.  Further recommendations to follow.      Tana Coast, P.AJonathon Bellows, M.D.  Electronically Signed    LL/MEDQ  D:  03/10/2006  T:  03/10/2006  Job:  [id number]   cc:   Delaney Meigs, M.D.  Fax:  [phone]

## 2011-03-27 NOTE — Cardiovascular Report (Signed)
[place]  Patient:    [name] [name], [name] [name] Visit Number: [id number] MRN: [id number]          Service Type: CAT Location: [id number] Attending Physician:  Jonelle Sidle Dictated by:   Jonelle Sidle, M.D. [facility] Proc. Date: 06/01/01 Admit Date:  06/01/2002 Discharge Date: 06/02/2002                          Cardiac Catheterization  DATE OF BIRTH: August 04, 1936  PRIMARY CARE PHYSICIAN: Colon Flattery, D.O.  Corinda Gubler CARDIOLOGIST: Jonelle Sidle, M.D.  INDICATIONS: The patient is a 75 year old male with hypertension, dyslipidemia and a known history of coronary artery disease, status post coronary artery bypass grafting in April of this year with a LIMA to the LAD, sequential saphenous vein graft to the first diagonal and ramus intermedius and sequential saphenous vein graft to the posterior descending and posterolateral branches. He has had progressive chest discomfort over the last several weeks and underwent an adenosine Cardiolite recently which revealed an ejection fraction of 40% with evidence of anterolateral and inferior apical ischemia. He is referred for cardiac catheterization to assess his bypass graft and native coronary anatomy and look for potential revascularization options.  PROCEDURES PERFORMED: 1. Left heart catheterization. 2. Selective coronary angiography.  ACCESS AND EQUIPMENT: The area about the right femoral artery was anesthetized with 1% lidocaine and a 6 French sheath was placed in the right femoral artery via the modified Seldinger technique. Standard preformed 6 [place] and JR4 catheters were used for selective coronary angiography. The 6 JR4 catheter was used for angiography of the left internal mammary artery graft. A 6 French angled pigtail catheter was used for left heart catheterization. Left ventriculography was not performed. All exchanges were made over a wire. The patient tolerated the  procedure well without complications.  HEMODYNAMICS: Left ventricle 177/28 mmHg. Aorta 178/83 mmHg.  ANGIOGRAPHIC FINDINGS: 1. The left main coronary artery is free of significant flow-limiting coronary    atherosclerosis. 2. The left anterior descending has a complex 80% proximal stenosis followed    by more diffuse distal disease. The distal left anterior descending fills    via the left internal mammary artery graft and is a small diffusely    diseased segment. There is 70% proximal stenosis involving the first and    second diagonal branches which are small. 3. The ramus intermedius branch has a 75% proximal stenosis and is a small    vessel. 4. The circumflex coronary artery has an 80% proximal stenosis. 5. The right coronary artery is a dominant vessel that has 50% proximal    stenosis, 60-70% mid vessel stenosis and 80% stenoses involving the    posterior descending and posterolateral branches. 6. The left internal mammary artery graft to the left anterior descending is    patent. 7. The sequential saphenous vein graft to the posterior descending and    posterolateral branches is patent. There is a 90% stenosis in the posterior    descending branch following the anastomosis of the saphenous vein graft.    However, this segment is small. 8. The sequential saphenous vein graft to the ramus intermedius and first    diagonal branch is diseased in several areas. At the ostium, there is a    70% proximal stenosis that does not change with nitroglycerin injection    intracoronary. There is also approximately 90% anastomotic stenosis    involving not  only the portion of the first diagonal branch, but also the    segment anastomosing the ramus intermedius.  Left ventriculography was not performed.  DIAGNOSES: 1. Multivessel native coronary artery disease. 2. Patent left internal mammary artery to left anterior descending, patent    sequential saphenous vein graft to the posterior  descending and    posterolateral branches of the right coronary artery and a patent but    significantly diseased sequential saphenous vein graft to the first    diagonal and ramus intermedius branch as outlined.  RECOMMENDATIONS: I discussed the films with Dr. Samule Ohm. Most likely medical therapy will be pursued to treat the ischemia in the inferior apical distribution that is most likely due to the significant disease in the posterior descending branch that is not bypassed. In terms of the sequential saphenous vein graft to the diagonal and ramus intermedius branches, we will proceed with planned percutaneous intervention to address the stenoses.  PLAN: Discussed with patient and he agrees to proceed. Dictated by:   Jonelle Sidle, M.D. [facility] Attending Physician:  Jonelle Sidle DD:  06/01/02 TD:  06/05/02 Job: [id number]

## 2011-03-27 NOTE — Discharge Summary (Signed)
[place]. [facility]  Patient:    [name] [name], [name] [name] Visit Number: [id number] MRN: [id number]          Service Type: MED Location: MICU 2112 [id number] Attending Physician:  Colon Branch Dictated by:   Joellyn Rued, P.A.-C. Admit Date:  05/06/2002 Disc. Date: 05/08/02   CC:         [facility], [address], [place], [place] [address]  Dr. Colon Flattery   Referring Physician Discharge Summa  DATE OF BIRTH:  1936/04/24  ADMITTING PHYSICIAN:  Noralyn Pick. Eden Emms, M.D.  SUMMARY OF HISTORY:  The patient is a 75 year old white male who was referred from Dr. Garnette Gunner office when he presented with left shoulder blade discomfort. The patient describes a three-week history of left shoulder blade "dull aching" radiating into the back of his neck.  He denied associated nausea, vomiting, diaphoresis.  He is not sure if he is short of breath.  He states that is usually occurs in the morning when he is walking around before his "medicines take effect."  It is relieved with rest.  He has not used any nitroglycerin and feels it occurs "about every day."  It has not occurred at rest or nocturnally.  It did not resemble his prior symptoms prior to bypass surgery.  He states that it does not change with movement, deep breathing, eating, and he denies any injuries or falls.  He saw his primary M.D. due to his wifes insistence, and he was transferred here for further evaluation.  His history is notable for hyperlipidemia; he thinks his last check was approximately two to three days ago.  He also has a history of hypertension, peptic ulcer disease, status post left nephrectomy secondary to kidney stones, status post right nephrolithotomy.  He has a history of unstable angina with a positive stress test in February 2003.  Catheterization on January 13, 2002 showed an EF of 45-50%, three-vessel disease, inferior hypokinesis, and trace MR.  He underwent five-vessel  bypass grafting on February 13, 2002 with a LIMA to the LAD, saphenous vein graft to the diagonal and the ramus, saphenous vein graft to the PDA and the PL.  He has also had a history of inferior MI treated with urokinase and angioplasty to the RCA in 1994 as well as remote tobacco use.  LABORATORY DATA:  Chest x-ray did not show any active disease; he does have cardiomegaly.  Chest CT performed on June 29 shows small pericardial effusion, mild cardiomegaly, no pulmonary embolus, status post bypass surgery.  Admission H&H 12.3 and 37.1, MCV slightly low at 72.9, platelets 227, wbcs 6.2.  PT 13.5, PTT 28.  Sodium 140, potassium 5.1, BUN 10, creatinine 1.3, glucose 101, normal LFTs, albumin slightly low at 3.0, amylase 93, lipase 48. Serial of three CK-MBs and troponins were negative for myocardial infarction.  HOSPITAL COURSE:  The patient was admitted to 2100.  Overnight he did not have any further discomfort and he ruled out for myocardial infarction.  Dr. Eden Emms saw him on June 29 and felt that he should undergo CT scan, which was performed.  Overnight he did not have any further problems.  Dr. Diona Browner saw him on June 30 and felt that he could be discharged home with an outpatient Cardiolite evaluation for his atypical symptoms.  DISPOSITION:  He is discharged home.  MEDICATIONS:  Increased dose of Coreg 6.25 mg b.i.d.  He was asked to continue his other medications which included:  1. Coated aspirin 325 mg q.d. 2. Lipitor 10 mg q.h.s. 3. Protonix 40 mg q.d. 4. Lisinopril 10 mg two tablets daily. 5. Sublingual nitroglycerin as needed.  ACTIVITY:  He was advised to maintain low salt/fat/cholesterol diet.  FOLLOW-UP:  The office will call him at home with arrangements for an adenosine Cardiolite and a follow-up appointment with Dr. Diona Browner.  At the time of follow up Dr. Diona Browner will review his lipid panel and continue titration of his Coreg. Dictated by:   Joellyn Rued,  P.A.-C. Attending Physician:  Colon Branch DD:  05/08/02 TD:  05/08/02 Job: [id number]

## 2011-03-27 NOTE — Discharge Summary (Signed)
[name], [name]              ACCOUNT NO.:  [id number]   MEDICAL RECORD NO.:  [id number]          PATIENT TYPE:  INP   LOCATION:  A223                          FACILITY:  [facility]   PHYSICIAN:  Osvaldo Shipper, MD     DATE OF BIRTH:  1936/10/15   DATE OF ADMISSION:  10/08/2006  DATE OF DISCHARGE:  12/03/2007LH                               DISCHARGE SUMMARY   HISTORY OF PRESENT ILLNESS:  Please refer H&P dictated by Dr. Gasper Sells  for details regarding the patient's presenting illness.   DISCHARGE DIAGNOSES:  1. Acute sigmoid diverticulitis, improved.  2. History of coronary artery disease requiring a cardiac      catheterization; however, stable.  3. History of hypertension, stable.  4. Nephrolithiasis, stable.   PRIMARY MEDICAL DOCTOR:  Delaney Meigs, M.D.   BRIEF HOSPITAL COURSE:  1. Acute diverticulitis.  The patient is a 75 year old Caucasian male      who presented with left lower quadrant abdominal pain and underwent      a CT of the abdomen which showed evidence for sigmoid      diverticulitis.  He had a colonoscopy earlier this year which      showed diverticulosis on the left side.  The patient was put on      Levaquin and Flagyl.  He was initially kept NPO.  He slowly      improved. He was started back on his p.o. diet, and he was able to      tolerate solid food.  His antibiotics have been switched over to      p.o. today.  He was having some loose stools.  He had 2 this      morning, about 2 yesterday, which is likely from antibiotic.  The      patient, however, was very keen on going home.  I told him that if      his diarrhea got worse, if he develops more severe abdominal pain,      nausea or vomiting, he needed to seek attention immediately.  I      also told him that we would try to set up an appointment with Dr.      Jena Gauss in the next few weeks to check on him.  2. Incidental finding of perirectal lymphadenopathy was also noted on      the CT, for which  a repeat CT in 6 months was recommended.  This      will be arranged for this individual.  3. Renal cysts were also noted on the CT, corroborated by ultrasound.  4. The patient has a history of coronary artery disease and apparently      had an abnormal stress test a couple of weeks ago, for which a      cardiac catheterization was scheduled on October 08, 2006.  Since      the patient was admitted to the hospital, this had to be canceled.      We have called the [facility] cardiology office in [place] to let them      know of the situation,  and I have told them to reschedule that in      the next 2-3 weeks, once the patient is over his infection.  5. Otherwise, his other medical issues including his hypertension and      coronary artery disease all remain stable.   On the day of discharge, the patient's vital signs showed elevated blood  pressure of one reading.  Otherwise, he was feeling quite well,  ambulating in the hall with no difficulties.  Denied any chest pain,  shortness of breath, abdominal pain, nausea or vomiting.  The patient  was very keen on being discharged; hence, we went ahead and discharged  him.   DISCHARGE MEDICATIONS:  1. Cipro 500 mg p.o. b.i.d. for 12 days.  2. Flagyl 500 mg p.o. t.i.d. for 12 days.  3. He was otherwise asked to continue all of his other outpatient      medications as before with no changes made.   FOLLOWUP:  1. Followup with Dr. Jena Gauss in the next 3-4 weeks.  2. Followup with PMD in 1 week.  3. Followup with [facility] in the next few weeks.  The      cardiology office informed me that they would call the patient.  4. Followup also should include a CT scan in 6 months of his abdomen      and pelvis for followup on the lymphadenopathy noted in his      perirectal area.   DIET:  He was asked to continue with a heart healthy diet.   PHYSICAL ACTIVITY:  No restrictions.   TOTAL TIME SPENT AT DISCHARGE:  About 35 minutes.      Osvaldo Shipper, MD  Electronically Signed     GK/MEDQ  D:  10/11/2006  T:  10/11/2006  Job:  [id number]   cc:   Delaney Meigs, M.D.  Fax: [phone]   Jonelle Sidle, MD  [phone] [address]  [place], [place] [address]   R. Roetta Sessions, M.D.  [address]  [place]  [place] [address]

## 2011-03-27 NOTE — Discharge Summary (Signed)
[place]. [facility]  Patient:    KAYLIB, FURNESS Visit Number: [id number] MRN: [id number]          Service Type: CAT Location: [id number] Attending Physician:  Jonelle Sidle Dictated by:   Lavella Hammock, P.A.-C. Admit Date:  06/01/2002 Discharge Date: 06/02/2002   CC:         [facility] [address], [place], [place] [address]  Colon Flattery, D.O.   Referring Physician Discharge Summa  DATE OF BIRTH:  06/22/1936  PROCEDURES: 1. Cardiac catheterization. 2. Coronary arteriogram. 3. Left ventriculogram. 4. PTCA and stent to one vessel.  HOSPITAL COURSE:  [name] is a 75 year old male, who is status post aortocoronary bypass surgery in April 2003, with LIMA to LAD, SVG to D1 and ramus intermedius, and SVG to PDA and PLA.  His EF at that time was 48%. Recently, he developed recurring chest discomfort for which he was admitted to [facility] towards the end of June.  At that time, he ruled out for an MI and later had an outpatient Cardiolite.  The study revealed an EF of 40% with evidence of ischemia in the anterolateral and inferior walls as well as septal inferior and inferior apical hypokinesis.  The patient has continued to have exertional chest discomfort but has had no symptoms at rest.  He was evaluated in the office on May 26, 2002, and admitted to the hospital for cardiac catheterization and possible PCI on June 01, 2002.  The patient had his cardiac catheterization on June 01, 2002, and it demonstrated an LAD with an 80% proximal stenosis and a first diagonal with a 70% proximal stenosis.  The second diagonal also had a 70% proximal stenosis. The ramus intermedius had a 70% proximal stenosis, and the circumflex had an 80% proximal stenosis as well.  The RCA had a 50% proximal and 70% mid stenosis with 80% distal stenoses before the anastomosis in the PDA and after the anastomosis in the PLA.  The LIMA to  LAD was patent, and the SVG to PDA and PLA was patent as well.  The SVG to first diagonal and ramus intermedius had a 70% proximal stenosis and a 90% stenosis at the anastomosis of D1 and at the anastomosis of the ramus intermedius.  The films were evaluated by Dr. Diona Browner and by Dr. Joycelyn Rua, and it was felt that PCI to the SVG graft was indicated.  He had PTCA to the SVG to diagonal at the anastomosis, and he also had PTCA to the portion of the graft between the two vessels as well.  Additionally, he had PTCA to the graft where it anastomosed into the ramus intermedius, but this was complicated by dissection which was controlled with a stent.  The patient tolerated the procedure well, and there was good flow in the vessel after the procedure.  The next day, the patient was having no chest pain or shortness of breath.  He was having problems doing any kind of exercise walking program at home because he stated there were a lot of hills near his house, and he would get pretty consistent chest pain when we would attempt to exercise.  He was seen by cardiac rehab, and risk factor reduction as well as use of sublingual nitroglycerin and calling 911 were reviewed.  He was referred to the [facility] as well.  He was evaluated by Dr. Andee Lineman and considered stable for discharge  on June 02, 2002.  The patient expressed difficulty in getting his medications filled since he would be receiving Plavix for 30 days, and Zocor had been added to his medication regimen.  A social work consult was called but because he has a prescription plan, there is no program that is available to him for any other medical assistance.  Samples were provided from the office for the Zocor, but no Plavix samples were available.  He is to follow up in the office and advise [place] if he has any further difficulties obtaining his medications.  LABORATORY VALUES:  Hemoglobin 10.1, hematocrit 30.1, WBC 4.9,  platelets 197. Sodium 139, potassium 4.4, chloride 108, CO2 26, BUN 8, creatinine 1.5, glucose 101.  DISCHARGE CONDITION:  Improved.  DISCHARGE DIAGNOSES: 1. Coronary artery disease, status post PTCA x 2 to the saphenous vein graft    to diagonal and then ramus intermedius.  Stent required at the anastomosis    of the saphenous vein graft to the ramus intermedius. 2. Left ventricular dysfunction with an ejection fraction of 48% by    catheterization in June 2003, and 40% by [facility] in July 2003. 3. Status post aortocoronary bypass surgery in April 2003, with left internal    mammary artery to left anterior descending, saphenous vein graft to    diagonal 1, and ramus intermedius and saphenous vein graft to posterior    descending artery and posterolateral artery. 4. Hypertension. 5. Hyperlipidemia. 6. Status post myocardial infarction and PTCA in 1983. 7. History of peptic ulcer disease. 8. Status post right nephrolithotomy. 9. Status post left nephrectomy for kidney stones.  DISCHARGE INSTRUCTIONS: 1. His activity level is to include no driving for two days.  No sexual or    strenuous activity or work for one week. 2. He is to call the office for problems with the cath site. 3. He is to stick to a low fat diet. 4. He will need a BMET at his office visit to ensure that his kidney function    remains stable. 5. He is to follow up with Dr. Diona Browner on June 16, 2002, at 10:30 a.m. 6. He is to follow up with Dr. Colon Flattery on a p.r.n. basis or as scheduled.  DISCHARGE MEDICATIONS: 1. Coated aspirin 325 mg q.d. 2. Lisinopril 20 mg q.d. 3. Zocor 20 mg q.d. 4. Coreg 6.25 mg b.i.d. 5. Imdur 30 mg q.d. 6. Protonix 40 mg q.d. 7. Plavix 75 mg q.d. 8. Nitroglycerin p.r.n. Dictated by:   Lavella Hammock, P.A.-C. Attending Physician:  Jonelle Sidle DD:  06/02/02 TD:  06/02/02 Job: [id number]

## 2011-03-27 NOTE — Cardiovascular Report (Signed)
[place]. [facility]  Patient:    [name] [name], [name] [name] Visit Number: [id number] MRN: [id number]          Service Type: CAT Location: 6500 [id number] [id number] Attending Physician:  Jonelle Sidle Dictated by:   Salvadore Farber, M.D., [facility] Proc. Date: 06/01/02 Admit Date:  06/01/2002 Discharge Date: 06/02/2002   CC:         Colon Flattery, D.O.  Jonelle Sidle, M.D. [facility]   Cardiac Catheterization  PROCEDURES: There are multiple sites of disease in the sequential vein graft to the diagonal and ramus. Balloon angioplasty of the diagonal side-to-side anastomosis, balloon angioplasty of the vein graft distal to the diagonal anastomosis, and stenting of the anastomosis to the ramus.  INDICATION: Stable class III angina and exercise test with anterolateral ischemia.  INTERVENTIONAL TECHNIQUE: There were 6 French sheaths in place from the diagnostic procedure.  These were upsized to 7 [place] sheaths. Anticoagulation with bivalirudin was instituted to achieve an ACT greater than 250 seconds. A 7 [place] hockey-stick guide with side holes was then advanced over a wire and engaged in the sequential vein graft to diagonal and ramus. Angiography was performed by hand injection. A Hi-Torque Floppy wire was then advanced via the vein graft into the ramus. A second Hi-Torque Floppy wire was then advanced via the vein graft into the diagonal. Attention was first turned to the ramus graft. A 2.0 x 15 mm Maverick balloon was advanced to the anastomotic stricture. It was inflated once to 6 atmospheres for a minute. This single inflation was complicated by local dissection with transient TIMI-2 flow. Further prolonged balloon inflations failed to seal the dissection. Therefore a 2.0 x 18 mm Pixel was placed across the anastomosis and inflated to 9 atmospheres. This was then post-dilated with a 2.0 x 15 mm Maverick at 10 atmospheres. This reduced the stenosis from 90% to 0% and  there was TIMI-3 flow.  Attention was then turned to the diagonal. A 2.0 x 15 mm Maverick was advanced over the wire and positioned across the lesion. Five sequential balloon inflations were made to a maximum of 12 atmospheres. There was 20% residual stenosis.  Next, attention was turned to the portion of the vein graft immediately distal to the side-to-side diagonal anastomosis. A 2.5 x 20 mm Maverick balloon was inflated twice to 6 atmospheres. This resulted in decrease in the stenosis from 80% to 0% and there was TIMI-3 flow. These inflations extended from the anastomotic site to the proximal edge of the stent.  There was then noted substantial recoil in the diagonal anastomosis. Therefore, a 2.25 x 20 mm Quantum Maverick was positioned across the anastomosis and inflated once to 12 atmospheres for a minute. This resulted in substantial improvement with 20% residual stenosis and TIMI-3 flow. Final angiograms demonstrated TIMI-3 flow in both vascular beds and no residual dissection.  IMPRESSION: Successful balloon angioplasty of the diagonal anastomosis, successful balloon angioplasty of the saphenous vein graft extension to the ramus, and successful stenting of the ramus anastomosis.  PLAN: His sheaths will be removed in one hour. He received 300 mg of Plavix and will be continued on Plavix and aspirin for a minimum of 30 days. He will follow up with Dr. Diona Browner in [place]. Dictated by:   Salvadore Farber, M.D., [facility] Attending Physician:  Jonelle Sidle. DD:  06/01/02 TD:  06/06/02 Job: [id number]

## 2011-05-15 ENCOUNTER — Telehealth: Payer: Self-pay | Admitting: Cardiology

## 2011-05-15 NOTE — Telephone Encounter (Deleted)
Refill of coreg 12.5 mg # 60 [facility]

## 2011-05-19 ENCOUNTER — Other Ambulatory Visit: Payer: Self-pay | Admitting: *Deleted

## 2011-05-19 MED ORDER — CARVEDILOL 12.5 MG PO TABS: 12.5000 mg | ORAL_TABLET | Freq: Two times a day (BID) | ORAL | Status: DC

## 2011-05-21 ENCOUNTER — Encounter: Payer: Self-pay | Admitting: Urgent Care

## 2011-05-21 ENCOUNTER — Ambulatory Visit (INDEPENDENT_AMBULATORY_CARE_PROVIDER_SITE_OTHER): Payer: PRIVATE HEALTH INSURANCE | Admitting: Urgent Care

## 2011-05-21 DIAGNOSIS — K7689 Other specified diseases of liver: Secondary | ICD-10-CM

## 2011-05-21 DIAGNOSIS — K760 Fatty (change of) liver, not elsewhere classified: Secondary | ICD-10-CM

## 2011-05-21 DIAGNOSIS — K5909 Other constipation: Secondary | ICD-10-CM

## 2011-05-21 DIAGNOSIS — K219 Gastro-esophageal reflux disease without esophagitis: Secondary | ICD-10-CM

## 2011-05-21 DIAGNOSIS — R1319 Other dysphagia: Secondary | ICD-10-CM

## 2011-05-21 MED ORDER — DEXLANSOPRAZOLE 60 MG PO CPDR: 60.0000 mg | DELAYED_RELEASE_CAPSULE | Freq: Every day | ORAL | Status: DC

## 2011-05-21 NOTE — Assessment & Plan Note (Signed)
Responds well to fiber & prn miralax Add probiotic daily (ALIGN) for bloating

## 2011-05-21 NOTE — Progress Notes (Signed)
Addended by: Joselyn Arrow on: 05/21/2011 11:30 AM   Modules accepted: Orders

## 2011-05-21 NOTE — Progress Notes (Signed)
Cc to PCP 

## 2011-05-21 NOTE — Patient Instructions (Signed)
Stop protonix Start DEXILANT 60mg  daily Start ALIGN once daily Continue miralax & fiber

## 2011-05-21 NOTE — Assessment & Plan Note (Signed)
Hx Schatzki's ring. Doing well.

## 2011-05-21 NOTE — Progress Notes (Signed)
Primary Care Physician:  Josue Hector, MD Primary Gastroenterologist:  Dr. Jena Gauss  Chief Complaint  Patient presents with  . Follow-up    HPI:  [name] [name] is a 75 y.o. male here for follow up for chronic GERD, Schatzki's ring, NASH & chronic constipation.  On protonix 40mg  daily.  Feels like it "wears off".  Lots of symptoms at night with abdominal bloating & flatulence.  Denies heartburn or indigestion.  Denies dysphagia or odynophagia.  No teeth so he chews food thoroughly.  Takes miralax 17 grams daily prn & fiber.  This works well for chronic constipation.  Past Medical History  Diagnosis Date  . Diverticulosis 2007    colonoscopy  . Hypercholesteremia   . HTN (hypertension)   . CAD (coronary artery disease)   . Ischemic cardiomyopathy   . Fatty liver disease, nonalcoholic   . Chronic constipation   . Nephrolithiasis   . Hiatal hernia   . Schatzki's ring     Lst EGD/ED Dr Eliezer Champagne  . GERD (gastroesophageal reflux disease)     Past Surgical History  Procedure Date  . Coronary artery bypass graft 2002    x5  . Nephrectomy     left  . Kidney stone surgery     right  . Knee arthroscopy     left    Current Outpatient Prescriptions  Medication Sig Dispense Refill  . amLODipine (NORVASC) 10 MG tablet       . atorvastatin (LIPITOR) 40 MG tablet       . AVODART 0.5 MG capsule       . carvedilol (COREG) 12.5 MG tablet Take 1 tablet (12.5 mg total) by mouth 2 (two) times daily.  30 tablet  3  . Clopidogrel Bisulfate (PLAVIX PO) Take 75 mg by mouth Daily.      . hydrochlorothiazide (,MICROZIDE/HYDRODIURIL,) 12.5 MG capsule       . lisinopril (PRINIVIL,ZESTRIL) 30 MG tablet       . pantoprazole (PROTONIX) 40 MG tablet TAKE ONE BY MOUTH 30 MINS BEFORE BREAKFAST. TAKE 10 HOURS APART FROM PLAVIX  30 tablet  11  . PLAVIX 75 MG tablet       . polyethylene glycol (MIRALAX / GLYCOLAX) packet Take 17 g by mouth daily as needed.          Allergies as of 05/21/2011    . (No Known Allergies)    Family History  Problem Relation Age of Onset  . Coronary artery disease Father     History   Social History  . Marital Status: Married    Spouse Name: N/A    Number of Children: 6  . Years of Education: N/A   Occupational History  . retired     Psychologist, clinical   Social History Main Topics  . Smoking status: Former Smoker -- 0.5 packs/day for 2 years    Types: Cigarettes    Quit date: 05/20/1961  . Smokeless tobacco: Not on file   Comment: quit 50 yrs ago  . Alcohol Use: No  . Drug Use: No  . Sexually Active: Not on file   Review of Systems: Gen: Denies any fever, chills, sweats, anorexia, fatigue, weakness, malaise, weight loss, and sleep disorder CV: Denies chest pain, angina, palpitations, syncope, orthopnea, PND, peripheral edema, and claudication. Resp: Denies dyspnea at rest, dyspnea with exercise, cough, sputum, wheezing, coughing up blood, and pleurisy. GI: Denies vomiting blood, jaundice, and fecal incontinence.   Denies dysphagia or odynophagia. Derm: Denies rash,  itching, dry skin, hives, moles, warts, or unhealing ulcers.  Psych: Denies depression, anxiety, memory loss, suicidal ideation, hallucinations, paranoia, and confusion. Heme: Denies bruising, bleeding, and enlarged lymph nodes.  Physical Exam: BP 119/73  Pulse 59  Temp(Src) 98 F (36.7 C) (Temporal)  Ht 5\' 5"  (1.651 m)  Wt 205 lb 3.2 oz (93.078 kg)  BMI 34.15 kg/m2 General:   Alert,  Well-developed, obese, pleasant and cooperative in NAD Head:  Normocephalic and atraumatic. Eyes:  Sclera clear, no icterus.   Conjunctiva pink. Mouth:  No deformity or lesions, dentition normal. Neck:  Supple; no masses or thyromegaly. Heart:  Regular rate and rhythm; no murmurs, clicks, rubs,  or gallops. Abdomen:  Soft, nontender and nondistended. No masses, hepatosplenomegaly or hernias noted. Normal bowel sounds, without guarding, and without rebound.   Msk:  Symmetrical  without gross deformities. Normal posture. Pulses:  Normal pulses noted. Extremities:  Without clubbing or edema. Neurologic:  Alert and  oriented x4;  grossly normal neurologically. Skin:  Intact without significant lesions or rashes. Cervical Nodes:  No significant cervical adenopathy. Psych:  Alert and cooperative. Normal mood and affect.

## 2011-05-21 NOTE — Assessment & Plan Note (Signed)
Refractory symptoms of bloating at night.  Change to dexilant 60mg  daily.

## 2011-05-21 NOTE — Assessment & Plan Note (Signed)
Will check recent LFTs through PCP Instructions for fatty liver: Recommend 1-2# weight loss per week until ideal body weight through exercise & diet. Low fat/cholesterol diet. Gradually increase exercise from 15 min daily up to 1 hr per day 5 days/week. Limit alcohol use.

## 2011-05-22 ENCOUNTER — Ambulatory Visit: Payer: Self-pay | Admitting: Internal Medicine

## 2011-06-07 ENCOUNTER — Other Ambulatory Visit: Payer: Self-pay | Admitting: Cardiology

## 2011-06-08 NOTE — Telephone Encounter (Signed)
[place] pt. 

## 2011-06-22 ENCOUNTER — Telehealth: Payer: Self-pay | Admitting: *Deleted

## 2011-06-22 MED ORDER — CLOPIDOGREL BISULFATE 75 MG PO TABS: 75.0000 mg | ORAL_TABLET | Freq: Every day | ORAL | Status: DC

## 2011-06-22 NOTE — Telephone Encounter (Signed)
Pt left message on voicemail asking for a return call. Pt stated in message that "they" couldn't refill his Plavix because there was something about him not needing it anymore.

## 2011-06-22 NOTE — Telephone Encounter (Signed)
Pt states he called in for a refill to [facility] in [place]. They came back and said that whoever they sent refill to said he wasn't on Plavix. He states he has been on this for several years. He does not know who refill request was sent to or why they said he was not taking Plavix. Pt's Plavix will be refilled. Pt also scheduled for 1 year f/u with Dr. Diona Browner.

## 2011-06-23 ENCOUNTER — Other Ambulatory Visit: Payer: Self-pay | Admitting: *Deleted

## 2011-06-23 MED ORDER — CLOPIDOGREL BISULFATE 75 MG PO TABS: 75.0000 mg | ORAL_TABLET | Freq: Every day | ORAL | Status: DC

## 2011-07-03 ENCOUNTER — Other Ambulatory Visit: Payer: Self-pay | Admitting: *Deleted

## 2011-07-03 MED ORDER — HYDROCHLOROTHIAZIDE 12.5 MG PO CAPS: ORAL_CAPSULE | ORAL | Status: DC

## 2011-07-10 ENCOUNTER — Encounter: Payer: Self-pay | Admitting: Cardiology

## 2011-07-14 ENCOUNTER — Ambulatory Visit (INDEPENDENT_AMBULATORY_CARE_PROVIDER_SITE_OTHER): Payer: PRIVATE HEALTH INSURANCE | Admitting: Urology

## 2011-07-14 DIAGNOSIS — R82998 Other abnormal findings in urine: Secondary | ICD-10-CM

## 2011-07-14 DIAGNOSIS — N400 Enlarged prostate without lower urinary tract symptoms: Secondary | ICD-10-CM

## 2011-07-14 DIAGNOSIS — R972 Elevated prostate specific antigen [PSA]: Secondary | ICD-10-CM

## 2011-07-20 ENCOUNTER — Other Ambulatory Visit: Payer: Self-pay | Admitting: *Deleted

## 2011-07-20 MED ORDER — CARVEDILOL 12.5 MG PO TABS: 12.5000 mg | ORAL_TABLET | Freq: Two times a day (BID) | ORAL | Status: DC

## 2011-08-07 ENCOUNTER — Ambulatory Visit: Payer: PRIVATE HEALTH INSURANCE | Admitting: Cardiology

## 2011-08-19 ENCOUNTER — Other Ambulatory Visit: Payer: Self-pay | Admitting: Cardiology

## 2011-08-20 LAB — URINALYSIS, ROUTINE W REFLEX MICROSCOPIC
Bilirubin Urine: NEGATIVE
Nitrite: NEGATIVE
Specific Gravity, Urine: 1.015
pH: 7

## 2011-08-20 LAB — URINE CULTURE: Colony Count: NO GROWTH

## 2011-08-20 LAB — URINE MICROSCOPIC-ADD ON

## 2011-08-28 ENCOUNTER — Encounter: Payer: Self-pay | Admitting: Cardiology

## 2011-09-04 ENCOUNTER — Ambulatory Visit: Payer: PRIVATE HEALTH INSURANCE | Admitting: Cardiology

## 2011-09-05 ENCOUNTER — Other Ambulatory Visit: Payer: Self-pay | Admitting: Cardiology

## 2011-09-07 ENCOUNTER — Encounter: Payer: Self-pay | Admitting: Cardiology

## 2011-09-07 NOTE — Telephone Encounter (Signed)
[place] pt. 

## 2011-09-08 ENCOUNTER — Encounter: Payer: Self-pay | Admitting: Cardiology

## 2011-09-08 ENCOUNTER — Ambulatory Visit (INDEPENDENT_AMBULATORY_CARE_PROVIDER_SITE_OTHER): Payer: PRIVATE HEALTH INSURANCE | Admitting: Cardiology

## 2011-09-08 VITALS — BP 122/77 | HR 52 | Ht 64.0 in | Wt 210.0 lb

## 2011-09-08 DIAGNOSIS — I2589 Other forms of chronic ischemic heart disease: Secondary | ICD-10-CM

## 2011-09-08 DIAGNOSIS — I10 Essential (primary) hypertension: Secondary | ICD-10-CM

## 2011-09-08 DIAGNOSIS — I2510 Atherosclerotic heart disease of native coronary artery without angina pectoris: Secondary | ICD-10-CM

## 2011-09-08 DIAGNOSIS — E7800 Pure hypercholesterolemia, unspecified: Secondary | ICD-10-CM

## 2011-09-08 NOTE — Progress Notes (Signed)
Clinical Summary [name] [name] is a 75 y.o.male presenting for followup. He was seen in July of last year. He states that he has done relatively well, no significant angina or nitroglycerin requirement. Reports NYHA class 2-3 dyspnea on exertion which has not changed. No palpitations or syncope. He continues to prefer an overall conservative approach to his cardiovascular management and he reports compliance with medications.  Lab work from last year showed cholesterol 108, triglycerides 146, HDL 36, LDL 43. Lipids as been followed by Dr. Lysbeth Galas. [name] [name] states that he has had subsequent labs, which will be requested for review.  States he still gets outdoors to do some hunting depending on the weather. No reported bleeding problems on Plavix.   No Known Allergies  Medication list reviewed.  Past Medical History  Diagnosis Date  . Diverticulosis 2007    Colonoscopy  . Mixed hyperlipidemia   . Essential hypertension, benign   . Coronary atherosclerosis of native coronary artery     Multivessel and bypass graft disease  . Ischemic cardiomyopathy     LVEF 35%  . Fatty liver disease, nonalcoholic   . Chronic constipation   . Nephrolithiasis   . Hiatal hernia   . Schatzki's ring     Last EGD/ED Dr Eliezer Champagne  . GERD (gastroesophageal reflux disease)     Past Surgical History  Procedure Date  . Coronary artery bypass graft 2002    LIMA to LAD, SVG to diagonal and ramus, SVG to PDA and PLA  . Nephrectomy     left  . Kidney stone surgery     right  . Knee arthroscopy     left    Family History  Problem Relation Age of Onset  . Coronary artery disease Father     Social History [name] [name] reports that he quit smoking about 50 years ago. His smoking use included Cigarettes. He has a 1 pack-year smoking history. He does not have any smokeless tobacco history on file. [name] [name] reports that he does not drink alcohol.  Review of Systems As outlined above. Does have occasional  lower extremity edema, mild. No orthopnea or PND. Otherwise negative.  Physical Examination Filed Vitals:   09/08/11 1353  BP: 122/77  Pulse: 52    Overweight male in no acute distress.  HEENT: Conjunctiva and lids normal, oropharynx with poor dentition.  Neck: Supple, no elevated JVP or bruits.  Lungs: Diminished, nonlabored.  Cardiac: Regular rate and rhythm, indistinct PMI, no S3.  Abdomen: Soft, nontender, bowel sounds present.  Skin: Warm and dry.  Extremities: 1+ edema below the knees, symmetrical. Distal pulses one plus.  Musculoskeletal: No gross deformities.  Neuropsychiatric: Alert and oriented x3, affect appropriate.   Problem List and Plan

## 2011-09-08 NOTE — Assessment & Plan Note (Signed)
LVEF approximately 35%. I have discussed potential for defibrillator placement, although [name] [name] states that he does not want to pursue this option.

## 2011-09-08 NOTE — Assessment & Plan Note (Signed)
Blood pressure well-controlled today. 

## 2011-09-08 NOTE — Patient Instructions (Signed)
**Note De-identified  Obfuscation** Your physician recommends that you continue on your current medications as directed. Please refer to the Current Medication list given to you today.  Your physician recommends that you schedule a follow-up appointment in: 1 year  

## 2011-09-08 NOTE — Assessment & Plan Note (Signed)
Lipids were well controlled as of last years labwork. Will request repeat assessment for review.

## 2011-09-08 NOTE — Assessment & Plan Note (Signed)
Symptomatically stable without progressive angina. Continue medical therapy and observation.

## 2011-09-17 ENCOUNTER — Other Ambulatory Visit: Payer: Self-pay | Admitting: Cardiology

## 2011-10-27 ENCOUNTER — Other Ambulatory Visit: Payer: Self-pay | Admitting: Cardiology

## 2011-12-14 ENCOUNTER — Other Ambulatory Visit: Payer: Self-pay | Admitting: *Deleted

## 2011-12-14 MED ORDER — CARVEDILOL 12.5 MG PO TABS: 12.5000 mg | ORAL_TABLET | Freq: Two times a day (BID) | ORAL | Status: DC

## 2011-12-15 ENCOUNTER — Telehealth: Payer: Self-pay | Admitting: Cardiology

## 2011-12-15 NOTE — Telephone Encounter (Signed)
[facility] states that they will supply pt. with 30 more tablets. Pt. Aware./LV

## 2011-12-15 NOTE — Telephone Encounter (Signed)
Patient states that his Carvedilol 12.5mg  was called in for 30 pills when he needs 60 because he takes 2 a day. / tg

## 2012-05-12 ENCOUNTER — Emergency Department (HOSPITAL_COMMUNITY)
Admission: EM | Admit: 2012-05-12 | Discharge: 2012-05-12 | Disposition: A | Discharge status: Home / Self Care | Payer: Medicare Other | Attending: Emergency Medicine | Admitting: Emergency Medicine

## 2012-05-12 ENCOUNTER — Encounter (HOSPITAL_COMMUNITY): Payer: Self-pay | Admitting: *Deleted

## 2012-05-12 ENCOUNTER — Emergency Department (HOSPITAL_COMMUNITY): Payer: Medicare Other

## 2012-05-12 DIAGNOSIS — R109 Unspecified abdominal pain: Secondary | ICD-10-CM | POA: Insufficient documentation

## 2012-05-12 DIAGNOSIS — I2510 Atherosclerotic heart disease of native coronary artery without angina pectoris: Secondary | ICD-10-CM | POA: Insufficient documentation

## 2012-05-12 DIAGNOSIS — R112 Nausea with vomiting, unspecified: Secondary | ICD-10-CM | POA: Insufficient documentation

## 2012-05-12 DIAGNOSIS — Z87891 Personal history of nicotine dependence: Secondary | ICD-10-CM | POA: Insufficient documentation

## 2012-05-12 DIAGNOSIS — R51 Headache: Secondary | ICD-10-CM | POA: Insufficient documentation

## 2012-05-12 DIAGNOSIS — R10814 Left lower quadrant abdominal tenderness: Secondary | ICD-10-CM | POA: Insufficient documentation

## 2012-05-12 DIAGNOSIS — M25519 Pain in unspecified shoulder: Secondary | ICD-10-CM | POA: Insufficient documentation

## 2012-05-12 DIAGNOSIS — Z79899 Other long term (current) drug therapy: Secondary | ICD-10-CM | POA: Insufficient documentation

## 2012-05-12 DIAGNOSIS — R42 Dizziness and giddiness: Secondary | ICD-10-CM | POA: Insufficient documentation

## 2012-05-12 DIAGNOSIS — K219 Gastro-esophageal reflux disease without esophagitis: Secondary | ICD-10-CM | POA: Insufficient documentation

## 2012-05-12 DIAGNOSIS — Z951 Presence of aortocoronary bypass graft: Secondary | ICD-10-CM | POA: Insufficient documentation

## 2012-05-12 DIAGNOSIS — N390 Urinary tract infection, site not specified: Secondary | ICD-10-CM

## 2012-05-12 LAB — COMPREHENSIVE METABOLIC PANEL
AST: 17 U/L (ref 0–37)
BUN: 13 mg/dL (ref 6–23)
CO2: 28 mEq/L (ref 19–32)
Calcium: 9.3 mg/dL (ref 8.4–10.5)
Chloride: 103 mEq/L (ref 96–112)
Creatinine, Ser: 1.47 mg/dL — ABNORMAL HIGH (ref 0.50–1.35)
GFR calc Af Amer: 53 mL/min — ABNORMAL LOW (ref >90)
GFR calc non Af Amer: 46 mL/min — ABNORMAL LOW (ref >90)
Glucose, Bld: 105 mg/dL — ABNORMAL HIGH (ref 70–99)
Total Bilirubin: 0.5 mg/dL (ref 0.3–1.2)

## 2012-05-12 LAB — URINALYSIS, ROUTINE W REFLEX MICROSCOPIC
Glucose, UA: NEGATIVE mg/dL
Hgb urine dipstick: NEGATIVE
Protein, ur: NEGATIVE mg/dL
pH: 6 (ref 5.0–8.0)

## 2012-05-12 LAB — CBC WITH DIFFERENTIAL/PLATELET
Basophils Absolute: 0.1 10*3/uL (ref 0.0–0.1)
Eosinophils Relative: 5 % (ref 0–5)
HCT: 46 % (ref 39.0–52.0)
Hemoglobin: 15.7 g/dL (ref 13.0–17.0)
Lymphocytes Relative: 20 % (ref 12–46)
Lymphs Abs: 1.4 10*3/uL (ref 0.7–4.0)
MCV: 80.6 fL (ref 78.0–100.0)
Monocytes Absolute: 0.7 10*3/uL (ref 0.1–1.0)
Monocytes Relative: 10 % (ref 3–12)
Neutro Abs: 4.6 10*3/uL (ref 1.7–7.7)
RDW: 13.7 % (ref 11.5–15.5)
WBC: 7.2 10*3/uL (ref 4.0–10.5)

## 2012-05-12 LAB — POCT I-STAT TROPONIN I: Troponin i, poc: 0.01 ng/mL (ref 0.00–0.08)

## 2012-05-12 LAB — URINE MICROSCOPIC-ADD ON

## 2012-05-12 MED ORDER — ONDANSETRON HCL 4 MG/2ML IJ SOLN
4.0000 mg | Freq: Once | INTRAMUSCULAR | Status: AC
  Administered 2012-05-12: 4 mg via INTRAVENOUS
  Filled 2012-05-12: qty 2

## 2012-05-12 MED ORDER — CIPROFLOXACIN HCL 250 MG PO TABS
500.0000 mg | ORAL_TABLET | Freq: Once | ORAL | Status: AC
  Administered 2012-05-12: 500 mg via ORAL
  Filled 2012-05-12: qty 2

## 2012-05-12 MED ORDER — ACETAMINOPHEN 500 MG PO TABS
1000.0000 mg | ORAL_TABLET | Freq: Once | ORAL | Status: AC
  Administered 2012-05-12: 1000 mg via ORAL
  Filled 2012-05-12: qty 2

## 2012-05-12 MED ORDER — MORPHINE SULFATE 4 MG/ML IJ SOLN
4.0000 mg | Freq: Once | INTRAMUSCULAR | Status: AC
  Administered 2012-05-12: 4 mg via INTRAVENOUS
  Filled 2012-05-12: qty 1

## 2012-05-12 MED ORDER — CIPROFLOXACIN HCL 500 MG PO TABS: 500.0000 mg | ORAL_TABLET | Freq: Two times a day (BID) | ORAL | Status: AC

## 2012-05-12 NOTE — ED Provider Notes (Signed)
See prior note   Ward Givens, MD 05/12/12 [phone]

## 2012-05-12 NOTE — ED Provider Notes (Signed)
History     CSN: [id number]  Arrival date & time 05/12/12  0750   First MD Initiated Contact with Patient 05/12/12 [phone]      Chief Complaint  Patient presents with  . Abdominal Pain  . Nausea  . Dizziness  . Shoulder Pain  . Headache    (Consider location/radiation/quality/duration/timing/severity/associated sxs/prior treatment) HPI Comments: [name] [name] presents with lower abdominal pain,  Nausea with one episode of vomiting which started around 6:30 this am,  Shortly after he woke.  He reports intermittent lower abdominal pain for the past several months,  But is worse this am.  He denies shortness of breath,  Chest pain,  Or changes in bowel habits,  He tends toward constipation and his last bowel movement was normal and yesterday evening.   He also describes generalized headache and feels lightheaded.  He also has complaint of left shoulder pain,  Also present for the past several months,  Intermittent,  Worse with movement and worse when he sleeps on his left side, which is how he woke this am.  His past medical history is significant for diverticulosis and CAD.   Patient is a 76 y.o. male presenting with abdominal pain, shoulder pain, and headaches. The history is provided by the patient and a relative.  Abdominal Pain The primary symptoms of the illness include abdominal pain, nausea and vomiting. The primary symptoms of the illness do not include fever, shortness of breath or diarrhea.  Additional symptoms associated with the illness include constipation. Symptoms associated with the illness do not include diaphoresis.  Shoulder Pain Associated symptoms include abdominal pain, arthralgias, headaches, nausea and vomiting. Pertinent negatives include no chest pain, congestion, coughing, diaphoresis, fever, joint swelling, neck pain, numbness, rash, sore throat or weakness.  Headache  Associated symptoms include nausea and vomiting. Pertinent negatives include no fever and no  shortness of breath.    Past Medical History  Diagnosis Date  . Diverticulosis 2007    Colonoscopy  . Mixed hyperlipidemia   . Essential hypertension, benign   . Coronary atherosclerosis of native coronary artery     Multivessel and bypass graft disease  . Ischemic cardiomyopathy     LVEF 35%  . Fatty liver disease, nonalcoholic   . Chronic constipation   . Nephrolithiasis   . Hiatal hernia   . Schatzki's ring     Last EGD/ED Dr Eliezer Champagne  . GERD (gastroesophageal reflux disease)     Past Surgical History  Procedure Date  . Coronary artery bypass graft 2002    LIMA to LAD, SVG to diagonal and ramus, SVG to PDA and PLA  . Nephrectomy     left  . Kidney stone surgery     right  . Knee arthroscopy     left    Family History  Problem Relation Age of Onset  . Coronary artery disease Father     History  Substance Use Topics  . Smoking status: Former Smoker -- 0.5 packs/day for 2 years    Types: Cigarettes    Quit date: 05/20/1961  . Smokeless tobacco: Not on file   Comment: quit 50 yrs ago  . Alcohol Use: No      Review of Systems  Constitutional: Negative for fever and diaphoresis.  HENT: Negative for congestion, sore throat and neck pain.   Eyes: Negative.   Respiratory: Negative for cough, chest tightness and shortness of breath.   Cardiovascular: Negative for chest pain.  Gastrointestinal: Positive for nausea, vomiting, abdominal  pain and constipation. Negative for diarrhea.  Genitourinary: Negative.   Musculoskeletal: Positive for arthralgias. Negative for joint swelling.  Skin: Negative.  Negative for rash and wound.  Neurological: Positive for headaches. Negative for dizziness, weakness, light-headedness and numbness.  Hematological: Negative.   Psychiatric/Behavioral: Negative.     Allergies  Review of patient's allergies indicates no known allergies.  Home Medications   Current Outpatient Rx  Name Route Sig Dispense Refill  . AMLODIPINE  BESYLATE 10 MG PO TABS Oral Take 10 mg by mouth daily.     . ATORVASTATIN CALCIUM 40 MG PO TABS Oral Take 40 mg by mouth daily.     . AVODART 0.5 MG PO CAPS Oral Take 0.5 mg by mouth daily.     Marland Kitchen CARVEDILOL 12.5 MG PO TABS Oral Take 1 tablet (12.5 mg total) by mouth 2 (two) times daily with a meal. 30 tablet 6  . CLOPIDOGREL BISULFATE 75 MG PO TABS Oral Take 75 mg by mouth daily.    Marland Kitchen HYDROCHLOROTHIAZIDE 12.5 MG PO CAPS Oral Take 12.5 mg by mouth daily.    Marland Kitchen LISINOPRIL 30 MG PO TABS Oral Take 30 mg by mouth 2 (two) times daily.     Marland Kitchen OMEPRAZOLE 20 MG PO CPDR Oral Take 20 mg by mouth daily.    Marland Kitchen CIPROFLOXACIN HCL 500 MG PO TABS Oral Take 1 tablet (500 mg total) by mouth 2 (two) times daily. 14 tablet 0    BP 145/74  Pulse 59  Temp 97.9 F (36.6 C)  Resp 12  SpO2 96%  Physical Exam  Nursing note and vitals reviewed. Constitutional: He appears well-developed and well-nourished.  HENT:  Head: Normocephalic and atraumatic.  Eyes: Conjunctivae are normal.  Neck: Normal range of motion.  Cardiovascular: Normal rate, regular rhythm, normal heart sounds and intact distal pulses.   Pulmonary/Chest: Effort normal and breath sounds normal. He has no wheezes.  Abdominal: Soft. Bowel sounds are normal. He exhibits distension. There is tenderness in the suprapubic area and left lower quadrant. There is no rigidity, no rebound, no guarding and no CVA tenderness. No hernia.       Increased tympany to percussion throughout all abdominal quadrants.  Musculoskeletal: Normal range of motion.       Left shoulder: He exhibits bony tenderness. He exhibits no swelling, no effusion, no deformity, no spasm and normal pulse.  Neurological: He is alert. He has normal strength. No sensory deficit.  Skin: Skin is warm and dry.  Psychiatric: He has a normal mood and affect.    ED Course  Procedures (including critical care time)  Labs Reviewed  URINALYSIS, ROUTINE W REFLEX MICROSCOPIC - Abnormal; Notable for  the following:    Leukocytes, UA MODERATE (*)     All other components within normal limits  COMPREHENSIVE METABOLIC PANEL - Abnormal; Notable for the following:    Glucose, Bld 105 (*)     Creatinine, Ser 1.47 (*)     Albumin 3.3 (*)     GFR calc non Af Amer 46 (*)     GFR calc Af Amer 53 (*)     All other components within normal limits  CBC WITH DIFFERENTIAL  POCT I-STAT TROPONIN I  URINE MICROSCOPIC-ADD ON  URINE CULTURE   Ct Abdomen Pelvis Wo Contrast  05/12/2012  *RADIOLOGY REPORT*  Clinical Data: Left lower quadrant pain, nausea, history of left nephrectomy  CT ABDOMEN AND PELVIS WITHOUT CONTRAST  Technique:  Multidetector CT imaging of the abdomen and pelvis  was performed following the standard protocol without intravenous contrast.  Comparison: CT abdomen pelvis of 07/11/2009  Findings: The lung bases are clear.  There is a moderate sized hiatal hernia present.  The liver is unremarkable in the unenhanced state.  No calcified gallstones are seen.  The pancreas is normal in size and the pancreatic duct is not dilated.  The adrenal glands and spleen are unremarkable.  The stomach is moderately distended with oral contrast with no abnormality noted other than the previously described hiatal hernia.  The patient has previously undergone left nephrectomy.  No hydronephrosis is seen on the right.  Multiple rounded low attenuation structures are again noted consistent with cysts one of which appears slightly larger than on the prior study.  The abdominal aorta is normal in caliber with moderate atheromatous change present.  No adenopathy is seen.  The urinary bladder is somewhat thick-walled and there is a prominent prostate present suggesting a degree of bladder outlet obstruction.  There are diverticula within the rectosigmoid colon and the distal descending colon but no diverticulitis is seen.  The terminal ileum and the appendix are unremarkable.  A slightly prominent lymph node appears stable  in the right posterior pararectal [name].  There is degenerative disc disease involving L4- 5 and L5-S1.  IMPRESSION:  1.  There are diverticula in the distal descending and rectosigmoid colon but no diverticulitis is seen. 2.  The appendix and terminal ileum appear normal. 3.  Somewhat thick-walled urinary bladder.  Prominent prostate. Question degree of bladder outlet obstruction. 4.  Moderate sized hiatal hernia. 5.  Prior left nephrectomy.  Original Report Authenticated By: Juline Patch, M.D.     1. UTI (lower urinary tract infection)       MDM  Cipro started in ed,  Pt to continue at home.  Strongly encouraged recheck here if sx worsen,  Specifically fever,  Uncontrolled vomiting or worse pain.  Otherwise, rest, increase fluids.  Plan for recheck by pcp within the next 5 days.  CT scan negative for diverticulitis or other acute source of abdominal pain.  Pt without abdominal pain at time of dc.  The patient appears reasonably screened and/or stabilized for discharge and I doubt any other medical condition or other [facility] requiring further screening, evaluation, or treatment in the ED at this time prior to discharge.    Date: 05/12/2012  Rate: 54  Rhythm: sinus bradycardia  QRS Axis: normal  Intervals: normal  ST/T Wave abnormalities: nonspecific ST/T changes  Conduction Disutrbances:Incomplete left bbb  Narrative Interpretation: unchanged from 07/11/09  Old EKG Reviewed: unchanged       Burgess Amor, PA 05/12/12 1717

## 2012-05-12 NOTE — ED Notes (Signed)
Pt c/o lower abd pain, nausea, headache, dizziness that started this am around 6:30, pt also c/o left shoulder pain that started a few days ago, left shoulder pain is worse with movement.

## 2012-05-12 NOTE — ED Notes (Signed)
Ask pt if he could use the restroom, he stated that he didn't think he could just yet.

## 2012-05-12 NOTE — ED Provider Notes (Signed)
This chart was scribed for Ward Givens, MD, MD by Smitty Pluck. The patient was seen in room APA05 and the patient's care was started at 9:32AM.  [name] [name] is a 76 y.o. male who presents to the Emergency Department complaining of moderate abdominal pain that he states he has had off and on for several months  with symptoms worsening today. Pt reports nausea without vomiting or diarrhea. He reports being dizzy. Reports constipation.    Pt is alert and cooperative Pt has distension, tenderness of LLQ without rebound and guarding.    Pt getting CT scan for possible diverticulitis.  Medical screening examination/treatment/procedure(s) were conducted as a shared visit with non-physician practitioner(s) and myself.  I personally evaluated the patient during the encounter  Devoria Albe, MD, FACEP  I personally performed the services described in this documentation, which was scribed in my presence. The recorded information has been reviewed and considered.  Devoria Albe, MD, Armando Gang   Ward Givens, MD 05/12/12 [phone]

## 2012-05-14 LAB — URINE CULTURE: Colony Count: 6000

## 2012-06-24 ENCOUNTER — Other Ambulatory Visit: Payer: Self-pay | Admitting: Cardiology

## 2012-06-29 ENCOUNTER — Encounter: Payer: Self-pay | Admitting: Cardiology

## 2012-06-29 ENCOUNTER — Encounter: Payer: Self-pay | Admitting: *Deleted

## 2012-06-29 ENCOUNTER — Ambulatory Visit (INDEPENDENT_AMBULATORY_CARE_PROVIDER_SITE_OTHER): Payer: PRIVATE HEALTH INSURANCE | Admitting: Cardiology

## 2012-06-29 VITALS — BP 103/56 | HR 59 | Ht 65.0 in | Wt 201.1 lb

## 2012-06-29 DIAGNOSIS — E7800 Pure hypercholesterolemia, unspecified: Secondary | ICD-10-CM

## 2012-06-29 DIAGNOSIS — I2589 Other forms of chronic ischemic heart disease: Secondary | ICD-10-CM

## 2012-06-29 DIAGNOSIS — I2510 Atherosclerotic heart disease of native coronary artery without angina pectoris: Secondary | ICD-10-CM

## 2012-06-29 DIAGNOSIS — I10 Essential (primary) hypertension: Secondary | ICD-10-CM

## 2012-06-29 DIAGNOSIS — Z01818 Encounter for other preprocedural examination: Secondary | ICD-10-CM

## 2012-06-29 NOTE — Assessment & Plan Note (Signed)
Patient being considered for elective left knee replacement, presumably under general anesthesia. His baseline risk would be at least moderate from a cardiac perspective, possibly higher if significant ischemic burden is documented. Fortunately, he has remained stable without progressive symptoms on medical therapy, and has preferred a relatively conservative approach overall. I did speak with him about pursuing a Myoview study to make sure that he does not have significant residual ischemic burden that would make his perioperative risk higher. We will proceed with further testing and can make a final recommendation, otherwise continue medical therapy.

## 2012-06-29 NOTE — Assessment & Plan Note (Signed)
Continues on statin therapy. 

## 2012-06-29 NOTE — Assessment & Plan Note (Signed)
Multivessel disease status post CABG, fortunately stable without progressive anginal medical therapy.

## 2012-06-29 NOTE — Assessment & Plan Note (Signed)
LVEF approximately 35%. He has declined to fibrillar placement. Weight is down, and he has had no definite decompensation in CHF symptoms.

## 2012-06-29 NOTE — Assessment & Plan Note (Signed)
Blood pressure is well-controlled today. 

## 2012-06-29 NOTE — Progress Notes (Signed)
Clinical Summary [name] is a 76 y.o.male presenting for followup. He was seen in October 2012. He reports no interval development of angina, has had stable NYHA class 2-3 dyspnea on exertion. No palpitations or syncope.  He reports compliance with his medication, still prefers a conservative approach to his cardiac condition, specifically declines defibrillator placement following discussions in the past. He has had no cardiac-related hospitalizations, no definite decompensation in CHF. Weight is down 9 pounds from the last visit.  He does state that he has been evaluated by Dr. Darrelyn [name], may be considered for elective left knee replacement, not yet scheduled. Right now he has significant left knee pain, states he has instability in his left knee, uses a brace.   No Known Allergies  Current Outpatient Prescriptions  Medication Sig Dispense Refill  . amLODipine (NORVASC) 10 MG tablet Take 10 mg by mouth daily.       Marland Kitchen atorvastatin (LIPITOR) 40 MG tablet Take 40 mg by mouth daily.       . AVODART 0.5 MG capsule Take 0.5 mg by mouth daily.       . carvedilol (COREG) 12.5 MG tablet TAKE 1 TABLET (12.5 MG TOTAL) BY MOUTH 2 (TWO) TIMES DAILY WITH A MEAL.  60 tablet  5  . clopidogrel (PLAVIX) 75 MG tablet Take 75 mg by mouth daily.      Marland Kitchen DEXILANT 60 MG capsule Take 60 mg by mouth daily.       . hydrochlorothiazide (MICROZIDE) 12.5 MG capsule Take 12.5 mg by mouth daily.      Marland Kitchen lisinopril (PRINIVIL,ZESTRIL) 30 MG tablet Take 30 mg by mouth 2 (two) times daily.       Marland Kitchen dexlansoprazole (DEXILANT) 60 MG capsule Take 1 capsule (60 mg total) by mouth daily.  31 capsule  11    Past Medical History  Diagnosis Date  . Diverticulosis 2007    Colonoscopy  . Mixed hyperlipidemia   . Essential hypertension, benign   . Coronary atherosclerosis of native coronary artery     Multivessel and bypass graft disease  . Ischemic cardiomyopathy     LVEF 35%  . Fatty liver disease, nonalcoholic   .  Chronic constipation   . Nephrolithiasis   . Hiatal hernia   . Schatzki's ring     Last EGD/ED Dr Eliezer Champagne  . GERD (gastroesophageal reflux disease)     Past Surgical History  Procedure Date  . Coronary artery bypass graft 2002    LIMA to LAD, SVG to diagonal and ramus, SVG to PDA and PLA  . Nephrectomy     left  . Kidney stone surgery     right  . Knee arthroscopy     left    Social History [name] [name] reports that he quit smoking about 51 years ago. His smoking use included Cigarettes. He has a 1 pack-year smoking history. He does not have any smokeless tobacco history on file. [name] [name] reports that he does not drink alcohol.  Review of Systems Negative except as outlined above.  Physical Examination Filed Vitals:   06/29/12 1259  BP: 103/56  Pulse: 59    Overweight male in no acute distress.  HEENT: Conjunctiva and lids normal, oropharynx with poor dentition.  Neck: Supple, no elevated JVP or bruits.  Lungs: Diminished, nonlabored.  Cardiac: Regular rate and rhythm, indistinct PMI, no S3.  Abdomen: Soft, nontender, bowel sounds present.  Skin: Warm and dry.  Extremities: 1+ edema below the knees, symmetrical. Distal  pulses one plus. Brace on left knee. Musculoskeletal: No gross deformities.  Neuropsychiatric: Alert and oriented x3, affect appropriate.   Problem List and Plan   Preoperative evaluation to rule out surgical contraindication Patient being considered for elective left knee replacement, presumably under general anesthesia. His baseline risk would be at least moderate from a cardiac perspective, possibly higher if significant ischemic burden is documented. Fortunately, he has remained stable without progressive symptoms on medical therapy, and has preferred a relatively conservative approach overall. I did speak with him about pursuing a Myoview study to make sure that he does not have significant residual ischemic burden that would make his  perioperative risk higher. We will proceed with further testing and can make a final recommendation, otherwise continue medical therapy.  CORONARY ATHEROSCLEROSIS NATIVE CORONARY ARTERY Multivessel disease status post CABG, fortunately stable without progressive anginal medical therapy.  CARDIOMYOPATHY, ISCHEMIC LVEF approximately 35%. He has declined to fibrillar placement. Weight is down, and he has had no definite decompensation in CHF symptoms.  ESSENTIAL HYPERTENSION, BENIGN Blood pressure is well controlled today.  HYPERCHOLESTEROLEMIA Continues on statin therapy.    Jonelle Sidle, M.D., F.A.C.C.

## 2012-06-29 NOTE — Patient Instructions (Addendum)
Your physician recommends that you schedule a follow-up appointment in:  6 months  Your physician has requested that you have a lexiscan myoview. For further information please visit [email or web address]. Please follow instruction sheet, as given.    

## 2012-07-07 ENCOUNTER — Encounter (HOSPITAL_COMMUNITY)
Admission: RE | Admit: 2012-07-07 | Discharge: 2012-07-07 | Disposition: A | Discharge status: Home / Self Care | Payer: Medicare Other | Source: Ambulatory Visit | Attending: Cardiology | Admitting: Cardiology

## 2012-07-07 ENCOUNTER — Encounter (HOSPITAL_COMMUNITY): Payer: Self-pay

## 2012-07-07 ENCOUNTER — Ambulatory Visit (INDEPENDENT_AMBULATORY_CARE_PROVIDER_SITE_OTHER): Payer: Medicare Other

## 2012-07-07 ENCOUNTER — Encounter (HOSPITAL_COMMUNITY): Payer: Self-pay | Admitting: Cardiology

## 2012-07-07 DIAGNOSIS — I10 Essential (primary) hypertension: Secondary | ICD-10-CM | POA: Insufficient documentation

## 2012-07-07 DIAGNOSIS — I2510 Atherosclerotic heart disease of native coronary artery without angina pectoris: Secondary | ICD-10-CM

## 2012-07-07 DIAGNOSIS — Z01818 Encounter for other preprocedural examination: Secondary | ICD-10-CM

## 2012-07-07 DIAGNOSIS — R079 Chest pain, unspecified: Secondary | ICD-10-CM | POA: Insufficient documentation

## 2012-07-07 DIAGNOSIS — E785 Hyperlipidemia, unspecified: Secondary | ICD-10-CM | POA: Insufficient documentation

## 2012-07-07 HISTORY — DX: Disorder of kidney and ureter, unspecified: N28.9

## 2012-07-07 MED ORDER — TECHNETIUM TC 99M TETROFOSMIN IV KIT
30.0000 | PACK | Freq: Once | INTRAVENOUS | Status: AC | PRN
  Administered 2012-07-07: 29.4 via INTRAVENOUS

## 2012-07-07 MED ORDER — TECHNETIUM TC 99M TETROFOSMIN IV KIT
10.0000 | PACK | Freq: Once | INTRAVENOUS | Status: AC | PRN
  Administered 2012-07-07: 10 via INTRAVENOUS

## 2012-07-07 NOTE — Progress Notes (Signed)
Stress Lab Nurses Notes - [facility]  GAMALIEL CHARNEY 07/07/2012 Reason for doing test: CAD and Surgical Clearance Type of test: Steffanie Dunn Nurse performing test: Parke Poisson, RN Nuclear Medicine Tech: Lyndel Pleasure Echo Tech: Not Applicable MD performing test: Ival Bible & Joni Reining NP Family MD: Nyland Test explained and consent signed: yes IV started: 22g jelco, Saline lock flushed, No redness or edema and Saline lock started in radiology Symptoms: Stomach discomfort & Headache Treatment/Intervention: None Reason test stopped: protocol completed After recovery IV was: Discontinued via X-ray tech and No redness or edema Patient to return to Nuc. Med at : 12:15 Patient discharged: Home Patient's Condition upon discharge was: stable Comments: During test BP 118/58 & HR 78.  Recovery BP 112/60 & HR 70.  Symptoms resolved in recovery. Erskine Speed T

## 2012-07-15 ENCOUNTER — Telehealth: Payer: Self-pay | Admitting: Cardiology

## 2012-07-15 NOTE — Telephone Encounter (Signed)
DR JEOFREY OFFICE IS WAITING ON SURGICAL CLEARANCE

## 2012-07-15 NOTE — Telephone Encounter (Signed)
Clearance sent 

## 2012-07-19 ENCOUNTER — Telehealth: Payer: Self-pay | Admitting: Cardiology

## 2012-07-19 NOTE — Telephone Encounter (Signed)
PT CALLING FOR TEST RESULTS °

## 2012-07-20 NOTE — Telephone Encounter (Signed)
Pt notified of results

## 2012-07-21 ENCOUNTER — Telehealth: Payer: Self-pay | Admitting: Cardiology

## 2012-07-21 NOTE — Telephone Encounter (Signed)
I have not seen a form.  However, Dr. Diona Browner documented his finding and gave surgical clearance in his office note which was forwarded to Dr Trudee Kuster through [facility] per Dr. Ival Bible request.

## 2012-07-21 NOTE — Telephone Encounter (Signed)
Received telephone call from [place] [phone] fax [phone].  She is looking for documentation sent to provide surgical clearance for Dr. Bryson Ha office.  She will refax form to [place].  Form was originally sent to [place].  Unable to locate form (blank or completed).

## 2012-07-26 ENCOUNTER — Encounter (HOSPITAL_COMMUNITY): Payer: Self-pay | Admitting: Pharmacy Technician

## 2012-07-27 NOTE — Patient Instructions (Signed)
20 [name] [name]  07/27/2012   Your procedure is scheduled on:  08/01/12 1030am-100pm  Report to[facility] at 0800 AM.  Call this number if you have problems the morning of surgery: [phone]   Remember:   Do not eat food:After Midnight.  May have clear liquids:until Midnight .    Take these medicines the morning of surgery with A SIP OF WATER:    Do not wear jewelry,   Do not wear lotions, powders, or perfumes.    Men may shave face and neck.  Do not bring valuables to the hospital.  Contacts, dentures or bridgework may not be worn into surgery.  Leave suitcase in the car. After surgery it may be brought to your room.  For patients admitted to the hospital, checkout time is 11:00 AM the day of discharge.     Special Instructions: Incentive Spirometry - Practice and bring it with you on the day of surgery. SEE CHG INSTRUCTION SHEET    Please read over the following fact sheets that you were given: MRSA Information, Blood Transfusion Fact sheet, coughing and deep breathing exercises, leg exercises

## 2012-07-28 ENCOUNTER — Encounter (HOSPITAL_COMMUNITY): Payer: Self-pay

## 2012-07-28 ENCOUNTER — Ambulatory Visit (HOSPITAL_COMMUNITY): Payer: Medicare Other

## 2012-07-28 ENCOUNTER — Encounter (HOSPITAL_COMMUNITY)
Admission: RE | Admit: 2012-07-28 | Discharge: 2012-07-28 | Disposition: A | Discharge status: Home / Self Care | Payer: Medicare Other | Source: Ambulatory Visit | Attending: Orthopedic Surgery | Admitting: Orthopedic Surgery

## 2012-07-28 HISTORY — DX: Headache: R51

## 2012-07-28 HISTORY — DX: Acute myocardial infarction, unspecified: I21.9

## 2012-07-28 HISTORY — DX: Unspecified osteoarthritis, unspecified site: M19.90

## 2012-07-28 LAB — COMPREHENSIVE METABOLIC PANEL
ALT: 15 U/L (ref 0–53)
AST: 18 U/L (ref 0–37)
Albumin: 3.3 g/dL — ABNORMAL LOW (ref 3.5–5.2)
Alkaline Phosphatase: 78 U/L (ref 39–117)
BUN: 19 mg/dL (ref 6–23)
CO2: 26 mEq/L (ref 19–32)
Calcium: 9.4 mg/dL (ref 8.4–10.5)
Chloride: 101 mEq/L (ref 96–112)
Creatinine, Ser: 1.7 mg/dL — ABNORMAL HIGH (ref 0.50–1.35)
GFR calc Af Amer: 44 mL/min — ABNORMAL LOW (ref >90)
GFR calc non Af Amer: 38 mL/min — ABNORMAL LOW (ref >90)
Glucose, Bld: 105 mg/dL — ABNORMAL HIGH (ref 70–99)
Potassium: 4.6 mEq/L (ref 3.5–5.1)
Sodium: 136 mEq/L (ref 135–145)
Total Bilirubin: 0.5 mg/dL (ref 0.3–1.2)
Total Protein: 6.7 g/dL (ref 6.0–8.3)

## 2012-07-28 LAB — CBC
HCT: 44 % (ref 39.0–52.0)
Hemoglobin: 14.9 g/dL (ref 13.0–17.0)
MCH: 27.2 pg (ref 26.0–34.0)
MCHC: 33.9 g/dL (ref 30.0–36.0)
MCV: 80.4 fL (ref 78.0–100.0)
RDW: 13.6 % (ref 11.5–15.5)

## 2012-07-28 LAB — URINALYSIS, ROUTINE W REFLEX MICROSCOPIC
Bilirubin Urine: NEGATIVE
Glucose, UA: NEGATIVE mg/dL
Ketones, ur: NEGATIVE mg/dL
Nitrite: NEGATIVE
Protein, ur: NEGATIVE mg/dL
Specific Gravity, Urine: 1.017 (ref 1.005–1.030)
Urobilinogen, UA: 1 mg/dL (ref 0.0–1.0)
pH: 5.5 (ref 5.0–8.0)

## 2012-07-28 LAB — PROTIME-INR
INR: 1 (ref 0.00–1.49)
Prothrombin Time: 13.1 seconds (ref 11.6–15.2)

## 2012-07-28 LAB — APTT: aPTT: 28 seconds (ref 24–37)

## 2012-07-28 LAB — URINE MICROSCOPIC-ADD ON

## 2012-07-28 NOTE — Progress Notes (Signed)
Clearance for surgery on chart  Stress test done 07/07/12 on chart  Office note 06/29/12 - [facility] with cardiology - Dr Diona Browner  EKG 05/12/12 [facility]  CT abd/pelvis 05/12/12 [facility]

## 2012-07-28 NOTE — Progress Notes (Signed)
07/28/12 1638  OBSTRUCTIVE SLEEP APNEA  Have you ever been diagnosed with sleep apnea through a sleep study? No  Do you snore loudly (loud enough to be heard through closed doors)?  1  Do you often feel tired, fatigued, or sleepy during the daytime? 0  Has anyone observed you stop breathing during your sleep? 0  Do you have, or are you being treated for high blood pressure? 1  BMI more than 35 kg/m2? 0  Age over 74 years old? 1  Neck circumference greater than 40 cm/18 inches? 0  Gender: 1  Obstructive Sleep Apnea Score 4

## 2012-07-28 NOTE — Progress Notes (Signed)
Fax and confirmation received to office of Dr Darrelyn Hillock regarding abnormal Creatinine, urinalysis and micro results.

## 2012-07-29 NOTE — Progress Notes (Signed)
Called and requested baseline EKG done at time of stress test on 07/07/12.  Received from [facility] in [place] and placed on chart.

## 2012-07-29 NOTE — H&P (Signed)
[name] [name] DOB: January 31, 1936  Chief Complaint: left knee pain  History of Present Illness The patient is a 76 year old male who comes in today for a preoperative History and Physical. The patient is scheduled for a left total knee arthroplasty to be performed by Dr. Georges Lynch. Darrelyn Hillock, MD at [facility] on Monday August 01, 2012. The patient is seen today for knee pain. The patient reports left knee symptoms including pain, swelling and instability which began years ago. Symptoms are exacerbated by weight bearing and walking. He has severe bone on bone, with the left knee much worse than the right. He has a severe genu varus bilaterally. He has definite chondromalacia involving both tibial joints as well. Basically, the main problem here is bone on bone and severe pain and severe chondromalacia of both knees, left greater than the right. He has had cortisone injection with limited benefit. Due to failure of conservative measures, the most predictable means for increased function and decreased pain in the left knee is a left total knee arthroplasty.  PCP: Dr. Joette Catching Cardio: Dr. Diona Browner  Problem List/Past MedicalHistory Osteoarthritis, Knee (715.96) Myocardial infarction. 1980s Hypercholesterolemia Kidney Stone Diverticulitis Of Colon Coronary artery disease Hypertension Cataract Hiatal Hernia Gastroesophageal Reflux Disease Diverticulosis Urinary Tract Infection Measles Mumps  Allergies No Known Drug Allergies.    Family History Father. deceased age 29 due to MI Mother. deceased age 48s due to CHF Hypertension. father, brother and child   Social History Exercise. Exercises rarely; does other Current work status. retired Alcohol use. never consumed alcohol Children. 5 or more Tobacco use. Uses chewing tobacco. former smoker; smoke(d) less than 1/2 pack(s) per day; uses 1/2 can(s) smokeless per week Living situation. live  with spouse Marital status. married Post-Surgical Plans. son, daughter, and wife at home Advance Directives. POA   Medication History AmLODIPine Besylate (10MG  Tablet, Oral daily) Active. Atorvastatin Calcium (40MG  Tablet, Oral daily) Active. Avodart (0.5MG  Capsule, Oral daily) Active. Carvedilol (12.5MG  Tablet, Oral two times daily) Active. Clopidogrel Bisulfate (75MG  Tablet, Oral daily) Active. Dexilant (60MG  Capsule DR, Oral daily) Active. Hydrochlorothiazide (12.5MG  Capsule, Oral daily) Active. Lisinopril (30MG  Tablet, Oral two times daily) Active. Omeprazole (20MG  Capsule DR, Oral as needed) Active.   Past Surgical History Kidney Removal. left Coronary Artery Bypass Graft. 4 or more vessels Heart Stents Hemorrhoidectomy    Review of Systems General:Not Present- Chills, Fever, Night Sweats, Fatigue, Weight Gain, Weight Loss and Memory Loss. Skin:Not Present- Hives, Itching, Rash, Eczema and Lesions. HEENT:Not Present- Tinnitus, Headache, Double Vision, Visual Loss, Hearing Loss and Dentures. Respiratory:Present- Shortness of breath with exertion. Not Present- Shortness of breath at rest, Allergies, Coughing up blood and Chronic Cough. Cardiovascular:Not Present- Chest Pain, Racing/skipping heartbeats, Difficulty Breathing Lying Down, Murmur, Swelling and Palpitations. Gastrointestinal:Not Present- Bloody Stool, Heartburn, Abdominal Pain, Vomiting, Nausea, Constipation, Diarrhea, Difficulty Swallowing, Jaundice and Loss of appetitie. Male Genitourinary:Present- Urinary frequency. Not Present- Blood in Urine, Weak urinary stream, Discharge, Flank Pain, Incontinence, Painful Urination, Urgency, Urinary Retention and Urinating at Night. Musculoskeletal:Present- Joint Swelling, Joint Pain and Back Pain. Not Present- Muscle Weakness, Muscle Pain, Morning Stiffness and Spasms. Neurological:Not Present- Tremor, Dizziness, Blackout spells, Paralysis, Difficulty with  balance and Weakness. Psychiatric:Not Present- Insomnia.   Vitals Weight: 200 lb Height: 65.5 in Body Surface Area: 2.05 m Body Mass Index: 32.78 kg/m Pulse: 51 (Regular) Resp.: 18 (Unlabored) BP: 121/81 (Sitting, Left Arm, Standard)    Physical Exam General Mental Status - Alert, cooperative and good historian. General Appearance- pleasant.  Not in acute distress. Orientation- Oriented X3. Build & Nutrition- Well developed and obese. Head and Neck Head- normocephalic, atraumatic . Neck Global Assessment- supple. no bruit auscultated on the right and no bruit auscultated on the left. Eye Pupil- Bilateral- Regular and Round. Motion- Bilateral- EOMI. Chest and Lung Exam Auscultation: Breath sounds:- clear at anterior chest wall and - clear at posterior chest wall. Adventitious sounds:- No Adventitious sounds. Cardiovascular Auscultation:Rhythm- Regular rate and rhythm. Heart Sounds- S1 WNL and S2 WNL. Murmurs & Other Heart Sounds:Auscultation of the heart reveals - No Murmurs. Abdomen Palpation/Percussion:Tenderness- Abdomen is non-tender to palpation. Rigidity (guarding)- Abdomen is soft. Abdomen is obese. Auscultation:Auscultation of the abdomen reveals - Bowel sounds normal. Male GenitourinaryNot done, not pertinent to present illness Peripheral Vascular Upper Extremity: Palpation:- Pulses bilaterally normal. Lower Extremity: Palpation:- Pulses bilaterally normal. Neurologic Examination of related systems reveals - normal muscle strength and tone in all extremities. Neurologic evaluation reveals - normal sensation. Musculoskeletal Pain with motion of the left knee. No effusion or instability. Genu varus. Tender along the medial joint line. Severe patellofemoral crepitus. 5 to 115 degrees. Normal painless motion in the hips.   Assessment & Plan Osteoarthritis, Knee (715.96) Left total knee  arthroplasty       Dimitri Ped, PA-C

## 2012-08-01 ENCOUNTER — Encounter (HOSPITAL_COMMUNITY): Payer: Self-pay | Admitting: *Deleted

## 2012-08-01 ENCOUNTER — Inpatient Hospital Stay (HOSPITAL_COMMUNITY)
Admission: RE | Admit: 2012-08-01 | Discharge: 2012-08-05 | DRG: 470 | Disposition: A | Discharge status: Skilled Nursing Facility | Payer: Medicare Other | Source: Ambulatory Visit | Attending: Orthopedic Surgery | Admitting: Orthopedic Surgery

## 2012-08-01 ENCOUNTER — Inpatient Hospital Stay (HOSPITAL_COMMUNITY): Payer: Medicare Other | Admitting: Anesthesiology

## 2012-08-01 ENCOUNTER — Inpatient Hospital Stay (HOSPITAL_COMMUNITY): Payer: Medicare Other

## 2012-08-01 ENCOUNTER — Encounter (HOSPITAL_COMMUNITY)
Admission: RE | Disposition: A | Discharge status: Skilled Nursing Facility | Payer: Self-pay | Source: Ambulatory Visit | Attending: Orthopedic Surgery

## 2012-08-01 ENCOUNTER — Encounter (HOSPITAL_COMMUNITY): Payer: Self-pay | Admitting: Anesthesiology

## 2012-08-01 DIAGNOSIS — I10 Essential (primary) hypertension: Secondary | ICD-10-CM | POA: Diagnosis present

## 2012-08-01 DIAGNOSIS — E782 Mixed hyperlipidemia: Secondary | ICD-10-CM | POA: Diagnosis present

## 2012-08-01 DIAGNOSIS — R112 Nausea with vomiting, unspecified: Secondary | ICD-10-CM | POA: Diagnosis not present

## 2012-08-01 DIAGNOSIS — I252 Old myocardial infarction: Secondary | ICD-10-CM

## 2012-08-01 DIAGNOSIS — Z96659 Presence of unspecified artificial knee joint: Secondary | ICD-10-CM

## 2012-08-01 DIAGNOSIS — Z6832 Body mass index (BMI) 32.0-32.9, adult: Secondary | ICD-10-CM

## 2012-08-01 DIAGNOSIS — Z8719 Personal history of other diseases of the digestive system: Secondary | ICD-10-CM

## 2012-08-01 DIAGNOSIS — Z9861 Coronary angioplasty status: Secondary | ICD-10-CM

## 2012-08-01 DIAGNOSIS — K449 Diaphragmatic hernia without obstruction or gangrene: Secondary | ICD-10-CM | POA: Diagnosis present

## 2012-08-01 DIAGNOSIS — D62 Acute posthemorrhagic anemia: Secondary | ICD-10-CM | POA: Diagnosis not present

## 2012-08-01 DIAGNOSIS — Z01812 Encounter for preprocedural laboratory examination: Secondary | ICD-10-CM

## 2012-08-01 DIAGNOSIS — I2510 Atherosclerotic heart disease of native coronary artery without angina pectoris: Secondary | ICD-10-CM | POA: Diagnosis present

## 2012-08-01 DIAGNOSIS — E669 Obesity, unspecified: Secondary | ICD-10-CM | POA: Diagnosis present

## 2012-08-01 DIAGNOSIS — K219 Gastro-esophageal reflux disease without esophagitis: Secondary | ICD-10-CM | POA: Diagnosis present

## 2012-08-01 DIAGNOSIS — M1710 Unilateral primary osteoarthritis, unspecified knee: Principal | ICD-10-CM | POA: Diagnosis present

## 2012-08-01 DIAGNOSIS — R944 Abnormal results of kidney function studies: Secondary | ICD-10-CM | POA: Diagnosis not present

## 2012-08-01 DIAGNOSIS — Z905 Acquired absence of kidney: Secondary | ICD-10-CM

## 2012-08-01 DIAGNOSIS — K7689 Other specified diseases of liver: Secondary | ICD-10-CM | POA: Diagnosis present

## 2012-08-01 DIAGNOSIS — I2589 Other forms of chronic ischemic heart disease: Secondary | ICD-10-CM | POA: Diagnosis present

## 2012-08-01 DIAGNOSIS — F17200 Nicotine dependence, unspecified, uncomplicated: Secondary | ICD-10-CM | POA: Diagnosis present

## 2012-08-01 DIAGNOSIS — Z79899 Other long term (current) drug therapy: Secondary | ICD-10-CM

## 2012-08-01 DIAGNOSIS — M1712 Unilateral primary osteoarthritis, left knee: Secondary | ICD-10-CM | POA: Diagnosis present

## 2012-08-01 DIAGNOSIS — Z87442 Personal history of urinary calculi: Secondary | ICD-10-CM

## 2012-08-01 DIAGNOSIS — I25810 Atherosclerosis of coronary artery bypass graft(s) without angina pectoris: Secondary | ICD-10-CM | POA: Diagnosis present

## 2012-08-01 HISTORY — PX: TOTAL KNEE ARTHROPLASTY: SHX125

## 2012-08-01 LAB — TYPE AND SCREEN
ABO/RH(D): A NEG
Antibody Screen: NEGATIVE

## 2012-08-01 SURGERY — ARTHROPLASTY, KNEE, TOTAL
Anesthesia: General | Site: Knee | Laterality: Left | Wound class: Clean

## 2012-08-01 MED ORDER — FENTANYL CITRATE 0.05 MG/ML IJ SOLN
INTRAMUSCULAR | Status: DC | PRN
  Administered 2012-08-01 (×4): 50 ug via INTRAVENOUS
  Administered 2012-08-01: 25 ug via INTRAVENOUS
  Administered 2012-08-01 (×2): 50 ug via INTRAVENOUS
  Administered 2012-08-01: 25 ug via INTRAVENOUS
  Administered 2012-08-01 (×3): 50 ug via INTRAVENOUS

## 2012-08-01 MED ORDER — SODIUM CHLORIDE 0.9 % IR SOLN
Status: DC | PRN
  Administered 2012-08-01: 3000 mL

## 2012-08-01 MED ORDER — THROMBIN 5000 UNITS EX SOLR
CUTANEOUS | Status: DC | PRN
  Administered 2012-08-01: 5000 [IU] via TOPICAL

## 2012-08-01 MED ORDER — PHENOL 1.4 % MT LIQD
1.0000 | OROMUCOSAL | Status: DC | PRN
  Filled 2012-08-01: qty 177

## 2012-08-01 MED ORDER — ACETAMINOPHEN 650 MG RE SUPP: 650.0000 mg | Freq: Four times a day (QID) | RECTAL | Status: DC | PRN

## 2012-08-01 MED ORDER — HYDROMORPHONE HCL PF 1 MG/ML IJ SOLN
0.2500 mg | INTRAMUSCULAR | Status: DC | PRN
  Administered 2012-08-01 (×3): 0.25 mg via INTRAVENOUS

## 2012-08-01 MED ORDER — HYDROMORPHONE HCL PF 1 MG/ML IJ SOLN
INTRAMUSCULAR | Status: AC
  Filled 2012-08-01: qty 1

## 2012-08-01 MED ORDER — POLYETHYLENE GLYCOL 3350 17 G PO PACK
17.0000 g | PACK | Freq: Every day | ORAL | Status: DC | PRN
  Administered 2012-08-03: 17 g via ORAL

## 2012-08-01 MED ORDER — OXYCODONE-ACETAMINOPHEN 5-325 MG PO TABS
2.0000 | ORAL_TABLET | ORAL | Status: DC | PRN
  Administered 2012-08-03 (×2): 1 via ORAL
  Administered 2012-08-05: 2 via ORAL
  Filled 2012-08-01 (×2): qty 1
  Filled 2012-08-01: qty 2

## 2012-08-01 MED ORDER — SUCCINYLCHOLINE CHLORIDE 20 MG/ML IJ SOLN
INTRAMUSCULAR | Status: DC | PRN
  Administered 2012-08-01: 100 mg via INTRAVENOUS

## 2012-08-01 MED ORDER — LACTATED RINGERS IV SOLN: INTRAVENOUS | Status: DC

## 2012-08-01 MED ORDER — RIVAROXABAN 10 MG PO TABS
10.0000 mg | ORAL_TABLET | Freq: Every day | ORAL | Status: DC
  Administered 2012-08-02 – 2012-08-05 (×4): 10 mg via ORAL
  Filled 2012-08-01 (×5): qty 1

## 2012-08-01 MED ORDER — LACTATED RINGERS IV SOLN
INTRAVENOUS | Status: DC
  Administered 2012-08-01: 1000 mL via INTRAVENOUS
  Administered 2012-08-01 (×2): via INTRAVENOUS

## 2012-08-01 MED ORDER — ALUM & MAG HYDROXIDE-SIMETH 200-200-20 MG/5ML PO SUSP
30.0000 mL | ORAL | Status: DC | PRN
  Administered 2012-08-05: 30 mL via ORAL
  Filled 2012-08-01: qty 30

## 2012-08-01 MED ORDER — BISACODYL 10 MG RE SUPP: 10.0000 mg | Freq: Every day | RECTAL | Status: DC | PRN

## 2012-08-01 MED ORDER — PANTOPRAZOLE SODIUM 40 MG PO TBEC
40.0000 mg | DELAYED_RELEASE_TABLET | Freq: Every day | ORAL | Status: DC
  Administered 2012-08-02 – 2012-08-05 (×4): 40 mg via ORAL
  Filled 2012-08-01 (×4): qty 1

## 2012-08-01 MED ORDER — ONDANSETRON HCL 4 MG PO TABS: 4.0000 mg | ORAL_TABLET | Freq: Four times a day (QID) | ORAL | Status: DC | PRN

## 2012-08-01 MED ORDER — ACETAMINOPHEN 325 MG PO TABS: 650.0000 mg | ORAL_TABLET | Freq: Four times a day (QID) | ORAL | Status: DC | PRN

## 2012-08-01 MED ORDER — LISINOPRIL 20 MG PO TABS
30.0000 mg | ORAL_TABLET | Freq: Every day | ORAL | Status: DC
  Administered 2012-08-02 – 2012-08-05 (×4): 30 mg via ORAL
  Filled 2012-08-01 (×4): qty 1

## 2012-08-01 MED ORDER — LACTATED RINGERS IV SOLN
INTRAVENOUS | Status: DC
  Administered 2012-08-01 – 2012-08-04 (×6): via INTRAVENOUS

## 2012-08-01 MED ORDER — CEFAZOLIN SODIUM-DEXTROSE 2-3 GM-% IV SOLR
2.0000 g | INTRAVENOUS | Status: AC
  Administered 2012-08-01: 2 g via INTRAVENOUS

## 2012-08-01 MED ORDER — ETOMIDATE 2 MG/ML IV SOLN
INTRAVENOUS | Status: DC | PRN
  Administered 2012-08-01: 10 mg via INTRAVENOUS

## 2012-08-01 MED ORDER — METHOCARBAMOL 500 MG PO TABS
500.0000 mg | ORAL_TABLET | Freq: Four times a day (QID) | ORAL | Status: DC | PRN
  Administered 2012-08-03 (×2): 500 mg via ORAL
  Filled 2012-08-01 (×3): qty 1

## 2012-08-01 MED ORDER — CEFAZOLIN SODIUM 1-5 GM-% IV SOLN
1.0000 g | Freq: Four times a day (QID) | INTRAVENOUS | Status: AC
  Administered 2012-08-01 (×2): 1 g via INTRAVENOUS
  Filled 2012-08-01 (×2): qty 50

## 2012-08-01 MED ORDER — PROMETHAZINE HCL 25 MG/ML IJ SOLN
12.5000 mg | Freq: Four times a day (QID) | INTRAMUSCULAR | Status: DC | PRN
  Administered 2012-08-02: 12.5 mg via INTRAVENOUS
  Filled 2012-08-01: qty 1

## 2012-08-01 MED ORDER — HYDROMORPHONE HCL PF 1 MG/ML IJ SOLN
1.0000 mg | INTRAMUSCULAR | Status: DC | PRN
  Administered 2012-08-01 – 2012-08-02 (×4): 1 mg via INTRAVENOUS
  Filled 2012-08-01 (×3): qty 1

## 2012-08-01 MED ORDER — CHLORHEXIDINE GLUCONATE 4 % EX LIQD
60.0000 mL | Freq: Once | CUTANEOUS | Status: DC
  Filled 2012-08-01: qty 60

## 2012-08-01 MED ORDER — METHOCARBAMOL 100 MG/ML IJ SOLN
500.0000 mg | Freq: Four times a day (QID) | INTRAVENOUS | Status: DC | PRN
  Administered 2012-08-01 – 2012-08-02 (×3): 500 mg via INTRAVENOUS
  Filled 2012-08-01 (×3): qty 5

## 2012-08-01 MED ORDER — BUPIVACAINE LIPOSOME 1.3 % IJ SUSP
20.0000 mL | INTRAMUSCULAR | Status: AC
  Administered 2012-08-01: 20 mL
  Filled 2012-08-01: qty 20

## 2012-08-01 MED ORDER — AMLODIPINE BESYLATE 10 MG PO TABS
10.0000 mg | ORAL_TABLET | Freq: Every day | ORAL | Status: DC
  Administered 2012-08-02 – 2012-08-04 (×3): 10 mg via ORAL
  Filled 2012-08-01 (×5): qty 1

## 2012-08-01 MED ORDER — HYDROCODONE-ACETAMINOPHEN 5-325 MG PO TABS
1.0000 | ORAL_TABLET | ORAL | Status: DC | PRN
  Administered 2012-08-01 – 2012-08-03 (×2): 2 via ORAL
  Administered 2012-08-04: 1 via ORAL
  Administered 2012-08-05: 2 via ORAL
  Filled 2012-08-01: qty 1
  Filled 2012-08-01 (×4): qty 2

## 2012-08-01 MED ORDER — PROPOFOL 10 MG/ML IV BOLUS
INTRAVENOUS | Status: DC | PRN
  Administered 2012-08-01: 40 mg via INTRAVENOUS

## 2012-08-01 MED ORDER — THROMBIN 5000 UNITS EX SOLR
CUTANEOUS | Status: AC
  Filled 2012-08-01: qty 5000

## 2012-08-01 MED ORDER — SODIUM CHLORIDE 0.9 % IR SOLN
Status: DC | PRN
  Administered 2012-08-01: 11:00:00

## 2012-08-01 MED ORDER — DUTASTERIDE 0.5 MG PO CAPS
0.5000 mg | ORAL_CAPSULE | Freq: Every day | ORAL | Status: DC
  Administered 2012-08-02 – 2012-08-04 (×3): 0.5 mg via ORAL
  Filled 2012-08-01 (×5): qty 1

## 2012-08-01 MED ORDER — ATORVASTATIN CALCIUM 40 MG PO TABS
40.0000 mg | ORAL_TABLET | Freq: Every day | ORAL | Status: DC
  Administered 2012-08-02 – 2012-08-04 (×3): 40 mg via ORAL
  Filled 2012-08-01 (×5): qty 1

## 2012-08-01 MED ORDER — FLEET ENEMA 7-19 GM/118ML RE ENEM: 1.0000 | ENEMA | Freq: Once | RECTAL | Status: AC | PRN

## 2012-08-01 MED ORDER — HYDROCHLOROTHIAZIDE 12.5 MG PO CAPS
12.5000 mg | ORAL_CAPSULE | Freq: Every morning | ORAL | Status: DC
  Administered 2012-08-02 – 2012-08-05 (×4): 12.5 mg via ORAL
  Filled 2012-08-01 (×4): qty 1

## 2012-08-01 MED ORDER — CARVEDILOL 12.5 MG PO TABS
12.5000 mg | ORAL_TABLET | Freq: Two times a day (BID) | ORAL | Status: DC
Start: 2012-08-01 — End: 2012-08-05
  Administered 2012-08-01 – 2012-08-05 (×8): 12.5 mg via ORAL
  Filled 2012-08-01 (×11): qty 1

## 2012-08-01 MED ORDER — ONDANSETRON HCL 4 MG/2ML IJ SOLN
INTRAMUSCULAR | Status: DC | PRN
  Administered 2012-08-01: 4 mg via INTRAVENOUS

## 2012-08-01 MED ORDER — ONDANSETRON HCL 4 MG/2ML IJ SOLN
4.0000 mg | Freq: Four times a day (QID) | INTRAMUSCULAR | Status: DC | PRN
  Administered 2012-08-01 – 2012-08-02 (×2): 4 mg via INTRAVENOUS
  Filled 2012-08-01 (×2): qty 2

## 2012-08-01 MED ORDER — FERROUS SULFATE 325 (65 FE) MG PO TABS
325.0000 mg | ORAL_TABLET | Freq: Three times a day (TID) | ORAL | Status: DC
  Administered 2012-08-03 – 2012-08-05 (×7): 325 mg via ORAL
  Filled 2012-08-01 (×13): qty 1

## 2012-08-01 MED ORDER — ACETAMINOPHEN 10 MG/ML IV SOLN
INTRAVENOUS | Status: DC | PRN
  Administered 2012-08-01: 1000 mg via INTRAVENOUS

## 2012-08-01 MED ORDER — METOCLOPRAMIDE HCL 5 MG/ML IJ SOLN
10.0000 mg | Freq: Four times a day (QID) | INTRAMUSCULAR | Status: DC | PRN
  Administered 2012-08-01 – 2012-08-05 (×3): 10 mg via INTRAVENOUS
  Filled 2012-08-01 (×3): qty 2

## 2012-08-01 MED ORDER — MENTHOL 3 MG MT LOZG
1.0000 | LOZENGE | OROMUCOSAL | Status: DC | PRN
  Filled 2012-08-01 (×2): qty 9

## 2012-08-01 SURGICAL SUPPLY — 66 items
BAG SPEC THK2 15X12 ZIP CLS (MISCELLANEOUS) ×1
BAG ZIPLOCK 12X15 (MISCELLANEOUS) ×2 IMPLANT
BANDAGE ELASTIC 4 VELCRO ST LF (GAUZE/BANDAGES/DRESSINGS) ×2 IMPLANT
BANDAGE ELASTIC 6 VELCRO ST LF (GAUZE/BANDAGES/DRESSINGS) ×2 IMPLANT
BANDAGE ESMARK 6X9 LF (GAUZE/BANDAGES/DRESSINGS) ×1 IMPLANT
BANDAGE GAUZE ELAST BULKY 4 IN (GAUZE/BANDAGES/DRESSINGS) ×2 IMPLANT
BLADE SAG 18X100X1.27 (BLADE) ×2 IMPLANT
BLADE SAW SGTL 11.0X1.19X90.0M (BLADE) ×2 IMPLANT
BNDG CMPR 9X6 STRL LF SNTH (GAUZE/BANDAGES/DRESSINGS) ×1
BNDG ESMARK 6X9 LF (GAUZE/BANDAGES/DRESSINGS) ×2
BONE CEMENT GENTAMICIN (Cement) ×4 IMPLANT
CEMENT BONE GENTAMICIN 40 (Cement) ×2 IMPLANT
CLOTH BEACON ORANGE TIMEOUT ST (SAFETY) ×2 IMPLANT
CUFF TOURN SGL QUICK 34 (TOURNIQUET CUFF) ×2
CUFF TRNQT CYL 34X4X40X1 (TOURNIQUET CUFF) ×1 IMPLANT
DRAPE EXTREMITY T 121X128X90 (DRAPE) ×2 IMPLANT
DRAPE INCISE IOBAN 66X45 STRL (DRAPES) ×2 IMPLANT
DRAPE LG THREE QUARTER DISP (DRAPES) ×2 IMPLANT
DRAPE POUCH INSTRU U-SHP 10X18 (DRAPES) ×2 IMPLANT
DRAPE U-SHAPE 47X51 STRL (DRAPES) ×2 IMPLANT
DRSG ADAPTIC 3X8 NADH LF (GAUZE/BANDAGES/DRESSINGS) ×2 IMPLANT
DRSG PAD ABDOMINAL 8X10 ST (GAUZE/BANDAGES/DRESSINGS) ×8 IMPLANT
DURAPREP 26ML APPLICATOR (WOUND CARE) ×2 IMPLANT
ELECT REM PT RETURN 9FT ADLT (ELECTROSURGICAL) ×2
ELECTRODE REM PT RTRN 9FT ADLT (ELECTROSURGICAL) ×1 IMPLANT
EVACUATOR 1/8 PVC DRAIN (DRAIN) ×2 IMPLANT
FACESHIELD LNG OPTICON STERILE (SAFETY) ×12 IMPLANT
GAUZE VASELINE 3X9 (GAUZE/BANDAGES/DRESSINGS) ×1 IMPLANT
GLOVE BIOGEL PI IND STRL 8 (GLOVE) ×1 IMPLANT
GLOVE BIOGEL PI IND STRL 8.5 (GLOVE) IMPLANT
GLOVE BIOGEL PI INDICATOR 8 (GLOVE) ×1
GLOVE BIOGEL PI INDICATOR 8.5 (GLOVE)
GLOVE ECLIPSE 8.0 STRL XLNG CF (GLOVE) ×4 IMPLANT
GLOVE SURG SS PI 6.5 STRL IVOR (GLOVE) ×4 IMPLANT
GOWN PREVENTION PLUS LG XLONG (DISPOSABLE) ×4 IMPLANT
GOWN STRL REIN XL XLG (GOWN DISPOSABLE) ×2 IMPLANT
HANDPIECE INTERPULSE COAX TIP (DISPOSABLE) ×2
IMMOBILIZER KNEE 20 (SOFTGOODS)
IMMOBILIZER KNEE 20 THIGH 36 (SOFTGOODS) IMPLANT
KIT BASIN OR (CUSTOM PROCEDURE TRAY) ×2 IMPLANT
MANIFOLD NEPTUNE II (INSTRUMENTS) ×2 IMPLANT
NEEDLE HYPO 22GX1.5 SAFETY (NEEDLE) ×2 IMPLANT
NS IRRIG 1000ML POUR BTL (IV SOLUTION) ×2 IMPLANT
PACK TOTAL JOINT (CUSTOM PROCEDURE TRAY) ×2 IMPLANT
POSITIONER SURGICAL ARM (MISCELLANEOUS) ×2 IMPLANT
SET HNDPC FAN SPRY TIP SCT (DISPOSABLE) ×1 IMPLANT
SET PAD KNEE POSITIONER (MISCELLANEOUS) ×2 IMPLANT
SPONGE LAP 18X18 X RAY DECT (DISPOSABLE) ×2 IMPLANT
SPONGE SURGIFOAM ABS GEL 100 (HEMOSTASIS) ×2 IMPLANT
STAPLER VISISTAT 35W (STAPLE) IMPLANT
SUCTION FRAZIER 12FR DISP (SUCTIONS) ×2 IMPLANT
SUT BONE WAX W31G (SUTURE) ×1 IMPLANT
SUT V-LOC BARB 180 2/0GR6 GS22 (SUTURE) ×2
SUT VIC AB 0 CT1 27 (SUTURE) ×4
SUT VIC AB 0 CT1 27XBRD ANTBC (SUTURE) ×2 IMPLANT
SUT VIC AB 1 CT1 27 (SUTURE) ×10
SUT VIC AB 1 CT1 27XBRD ANTBC (SUTURE) ×5 IMPLANT
SUT VIC AB 2-0 CT1 27 (SUTURE) ×6
SUT VIC AB 2-0 CT1 TAPERPNT 27 (SUTURE) ×3 IMPLANT
SUTURE V-LC BRB 180 2/0GR6GS22 (SUTURE) IMPLANT
SYR 20CC LL (SYRINGE) ×2 IMPLANT
TOWEL OR 17X26 10 PK STRL BLUE (TOWEL DISPOSABLE) ×4 IMPLANT
TOWER CARTRIDGE SMART MIX (DISPOSABLE) ×2 IMPLANT
TRAY FOLEY CATH 14FRSI W/METER (CATHETERS) ×2 IMPLANT
WATER STERILE IRR 1500ML POUR (IV SOLUTION) ×2 IMPLANT
WRAP KNEE MAXI GEL POST OP (GAUZE/BANDAGES/DRESSINGS) ×4 IMPLANT

## 2012-08-01 NOTE — Transfer of Care (Signed)
Immediate Anesthesia Transfer of Care Note  Patient: [name] [name]  Procedure(s) Performed: Procedure(s) (LRB) with comments: TOTAL KNEE ARTHROPLASTY (Left)  Patient Location: PACU  Anesthesia Type: General  Level of Consciousness: awake, alert , oriented and patient cooperative  Airway & Oxygen Therapy: Patient Spontanous Breathing and Patient connected to face mask oxygen  Post-op Assessment: Report given to PACU RN and Post -op Vital signs reviewed and stable  Post vital signs: Reviewed and stable  Complications: No apparent anesthesia complications

## 2012-08-01 NOTE — Anesthesia Postprocedure Evaluation (Signed)
  Anesthesia Post-op Note  Patient: [name] [name]  Procedure(s) Performed: Procedure(s) (LRB): TOTAL KNEE ARTHROPLASTY (Left)  Patient Location: PACU  Anesthesia Type: General  Level of Consciousness: awake and alert   Airway and Oxygen Therapy: Patient Spontanous Breathing  Post-op Pain: mild  Post-op Assessment: Post-op Vital signs reviewed, Patient's Cardiovascular Status Stable, Respiratory Function Stable, Patent Airway and No signs of Nausea or vomiting  Post-op Vital Signs: stable  Complications: No apparent anesthesia complications

## 2012-08-01 NOTE — Brief Op Note (Signed)
08/01/2012  12:52 PM  PATIENT:  Carole Civil  76 y.o. male  PRE-OPERATIVE DIAGNOSIS:  Osteoarthritis of the Left Knee  POST-OPERATIVE DIAGNOSIS:  Osteoarthritis of the Left Knee  PROCEDURE:  Procedure(s) (LRB) with comments: TOTAL KNEE ARTHROPLASTY (Left)  SURGEON:  Surgeon(s) and Role:    * Jacki Cones, MD - Primary  PHYSICIAN ASSISTANT:Matt Babish PA     ANESTHESIA:   general  EBL:  Total I/O In: 2000 [I.V.:2000] Out: 200 [Urine:100; Blood:100]  BLOOD ADMINISTERED:none  DRAINS: (1) Hemovact drain(s) in the Right with  Suction Open   LOCAL MEDICATIONS USED:  BUPIVICAINE 20cc mixed with 20cc normal Saline  SPECIMEN:  No Specimen  DISPOSITION OF SPECIMEN:  N/A  COUNTS:  YES  TOURNIQUET:  * Missing tourniquet times found for documented tourniquets in log:  82956 *  DICTATION: .Other Dictation: Dictation Number [id number]  PLAN OF CARE: Admit to inpatient   PATIENT DISPOSITION:  Stable in OR   Delay start of Pharmacological VTE agent (>24hrs) due to surgical blood loss or risk of bleeding: yes

## 2012-08-01 NOTE — Preoperative (Signed)
Beta Blockers   Reason not to administer Beta Blockers:Not Applicable 

## 2012-08-01 NOTE — Interval H&P Note (Signed)
History and Physical Interval Note:  08/01/2012 9:52 AM  [name] [name]  has presented today for surgery, with the diagnosis of Osteoarthritis of the Left Knee  The various methods of treatment have been discussed with the patient and family. After consideration of risks, benefits and other options for treatment, the patient has consented to  Procedure(s) (LRB) with comments: TOTAL KNEE ARTHROPLASTY (Left) as a surgical intervention .  The patient's history has been reviewed, patient examined, no change in status, stable for surgery.  I have reviewed the patient's chart and labs.  Questions were answered to the patient's satisfaction.     [name] A

## 2012-08-01 NOTE — Anesthesia Preprocedure Evaluation (Addendum)
Anesthesia Evaluation  Patient identified by MRN, date of birth, ID band Patient awake    Reviewed: Allergy & Precautions, H&P , NPO status , Patient's Chart, lab work & pertinent test results, reviewed documented beta blocker date and time   Airway Mallampati: II TM Distance: >3 FB Neck ROM: full    Dental No notable dental hx.    Pulmonary neg pulmonary ROS, Current Smoker,  breath sounds clear to auscultation  Pulmonary exam normal       Cardiovascular Exercise Tolerance: Good hypertension, + CAD, + Past MI and + CABG negative cardio ROS  Rhythm:regular Rate:Normal  Ischemic cardiomyopathy.  LVEF 35%.   Neuro/Psych negative neurological ROS  negative psych ROS   GI/Hepatic negative GI ROS, Neg liver ROS, hiatal hernia, GERD-  Controlled,  Endo/Other  negative endocrine ROS  Renal/GU Renal InsufficiencyRenal diseasenegative Renal ROSCRT 1.7  negative genitourinary   Musculoskeletal   Abdominal   Peds  Hematology negative hematology ROS (+)   Anesthesia Other Findings   Reproductive/Obstetrics negative OB ROS                           Anesthesia Physical Anesthesia Plan  ASA: IV  Anesthesia Plan: General   Post-op Pain Management:    Induction: Intravenous  Airway Management Planned: Oral ETT  Additional Equipment:   Intra-op Plan:   Post-operative Plan: Extubation in OR  Informed Consent: I have reviewed the patients History and Physical, chart, labs and discussed the procedure including the risks, benefits and alternatives for the proposed anesthesia with the patient or authorized representative who has indicated his/her understanding and acceptance.   Dental Advisory Given  Plan Discussed with: CRNA and Surgeon  Anesthesia Plan Comments:        Anesthesia Quick Evaluation

## 2012-08-02 ENCOUNTER — Encounter (HOSPITAL_COMMUNITY): Payer: Self-pay | Admitting: Orthopedic Surgery

## 2012-08-02 DIAGNOSIS — Z96659 Presence of unspecified artificial knee joint: Secondary | ICD-10-CM

## 2012-08-02 LAB — BASIC METABOLIC PANEL WITH GFR
BUN: 15 mg/dL (ref 6–23)
CO2: 27 meq/L (ref 19–32)
Calcium: 8.3 mg/dL — ABNORMAL LOW (ref 8.4–10.5)
Chloride: 99 meq/L (ref 96–112)
Creatinine, Ser: 1.51 mg/dL — ABNORMAL HIGH (ref 0.50–1.35)
GFR calc Af Amer: 51 mL/min — ABNORMAL LOW
GFR calc non Af Amer: 44 mL/min — ABNORMAL LOW
Glucose, Bld: 109 mg/dL — ABNORMAL HIGH (ref 70–99)
Potassium: 5.6 meq/L — ABNORMAL HIGH (ref 3.5–5.1)
Sodium: 132 meq/L — ABNORMAL LOW (ref 135–145)

## 2012-08-02 LAB — CBC
MCH: 27 pg (ref 26.0–34.0)
MCV: 81.3 fL (ref 78.0–100.0)
Platelets: 169 10*3/uL (ref 150–400)
RDW: 13.8 % (ref 11.5–15.5)

## 2012-08-02 NOTE — Progress Notes (Signed)
08/01/2012 Raynelle Bring BSN CCM [phone] Spoke with Vickie at [facility]- they can start HHpt services on thurs or Friday depending on discharge date. Faxed HHpt orders, face sheet, op note, and H&P to [phone] with confirmation. Cm will follow

## 2012-08-02 NOTE — Progress Notes (Signed)
Utilization review completed.  

## 2012-08-02 NOTE — Progress Notes (Signed)
   Subjective: 1 Day Post-Op Procedure(s) (LRB): TOTAL KNEE ARTHROPLASTY (Left) Patient reports pain as moderate.   Patient seen in rounds with Dr. Darrelyn Hillock. Patient is well, but has had some minor complaints of nausea/vomiting. He reports a history of having trouble with nausea and vomiting after surgery. No issues overnight. No complaints of shortness of breath or chest pain. We will start therapy today.  Plan is to go Home after hospital stay.  Objective: Vital signs in last 24 hours: Temp:  [96.6 F (35.9 C)-99.1 F (37.3 C)] 99.1 F (37.3 C) (09/24 0510) Pulse Rate:  [55-70] 70  (09/24 0510) Resp:  [10-20] 16  (09/24 0510) BP: (115-151)/(62-81) 122/75 mmHg (09/24 0510) SpO2:  [94 %-100 %] 97 % (09/24 0510) FiO2 (%):  [95 %] 95 % (09/23 1435) Weight:  [92.08 kg (203 lb)] 92.08 kg (203 lb) (09/23 1435)  Intake/Output from previous day:  Intake/Output Summary (Last 24 hours) at 08/02/12 0720 Last data filed at 08/02/12 0645  Gross per 24 hour  Intake   3695 ml  Output   1771 ml  Net   1924 ml    Intake/Output this shift:    Labs:  Basename 08/02/12 0429  HGB 12.0*    Basename 08/02/12 0429  WBC 8.8  RBC 4.44  HCT 36.1*  PLT 169    Basename 08/02/12 0429  NA 132*  K 5.6*  CL 99  CO2 27  BUN 15  CREATININE 1.51*  GLUCOSE 109*  CALCIUM 8.3*    EXAM General - Patient is Alert and Oriented Extremity - Neurologically intact Intact pulses distally Dorsiflexion/Plantar flexion intact Dressing - dressing C/D/I Motor Function - intact, moving foot and toes well on exam.  Hemovac pulled without difficulty.  Past Medical History  Diagnosis Date  . Diverticulosis 2007    Colonoscopy  . Mixed hyperlipidemia   . Essential hypertension, benign   . Coronary atherosclerosis of native coronary artery     Multivessel and bypass graft disease  . Ischemic cardiomyopathy     LVEF 35%  . Fatty liver disease, nonalcoholic   . Chronic constipation   .  Nephrolithiasis   . Hiatal hernia   . Schatzki's ring     Last EGD/ED Dr Eliezer Champagne  . GERD (gastroesophageal reflux disease)   . Renal insufficiency   . Myocardial infarction   . Headache     occasional headache   . Arthritis     Assessment/Plan: 1 Day Post-Op Procedure(s) (LRB): TOTAL KNEE ARTHROPLASTY (Left) Principal Problem:  *S/P total knee arthroplasty Active Problems:  Osteoarthritis of left knee   Advance diet Up with therapy Discharge home with home health tentative Thursday  DVT Prophylaxis - Xarelto Weight-Bearing as tolerated to left leg Will recheck labs in the morning Nurse reports that he has done well with Reglan. We will continue that for the time being to manage his nausea and vomiting.   Appolonia Ackert LAUREN 08/02/2012, 7:20 AM

## 2012-08-02 NOTE — Op Note (Signed)
[name] [name], [name] [name]              ACCOUNT NO.:  [id number]  MEDICAL RECORD NO.:  [id number]  LOCATION:  1618                         FACILITY:  [facility]  PHYSICIAN:  Georges Lynch. Soniyah Mcglory, M.D.DATE OF BIRTH:  [birth year]  DATE OF PROCEDURE:  08/01/2012 DATE OF DISCHARGE:                              OPERATIVE REPORT   SURGEON:  Georges Lynch. Darrelyn Hillock, M.D.  ASSISTANT:  Lanney Gins, PA.  POSTOPERATIVE DIAGNOSIS:  Severe degenerative arthritis with bone on bone and a flexion contracture and a severe genu varus of the left knee.  POSTOPERATIVE DIAGNOSIS:  Severe degenerative arthritis with bone on bone and a flexion contracture and a severe genu varus of the left knee.  OPERATION:  Left total knee arthroplasty utilizing DePuy system.  All 3 components were cemented and gentamicin was used in the cement.  At this particular time, the sizes of the femur was a size 4 left posterior cruciate sacrificing, the tibial tray was a size 4, the insert was a rotating platform size 4, 10 mm thickness, the patella was a size 41 with 3-peg.  PROCEDURE:  Under general anesthesia, routine orthopedic prep and draping the left lower extremity carried out.  Appropriate time-out was carried out for main incision.  I also marked the appropriate left leg in the holding area.  At this time, the leg was exsanguinated and Esmarch tourniquet was elevated at 325 mmHg.  The leg was placed in a [facility] Knee holder and anterior approach to the knee was carried out.  Two flaps were created.  Following that, I carried out a median parapatellar incision.  I reflected the patella laterally.  Note, the knee was extremely tight.  I had to do a good release immediately in order to loosen up the medial structures because of the genu varus.  After that, I removed all the large spurs that were posterior and medial on the tibial plateau.  At this time, initial drill hole was made in the intercondylar notch of the femur.  The  guide rod was inserted.  I thoroughly irrigated out the knee.  I then removed 12-mm thickness off the distal femur.  After that, I measured the femur to be a size 4 and made the appropriate anterior-posterior and chamfer cuts for a size 4 femur.  Following that, I then prepared the tibia in the usual fashion. We removed the appropriate amount of bone from the tibial plateau.  I then utilized my spacers.  We extremely tightened that up until I continued the medial release.  After that, I went back posteromedial and released the soft tissue structures as well.  Following that, we went back through the spacers again.  For a 10 mm thickness, we had good flexion and good extension now.  At that time, I irrigated the knee out and I then cut my keel out of the tibial plateau.  After this, I then went on and that was for a size 4 tray, and I then went on did the femur, I cut my knot, cut out the femur.  I then inserted  a trial component after we made sure there were no posterior spurs.  The trial components were inserted.  I then reduced the knee with a 10-mm thickness insert.  I then did a resurfacing procedure on the patella. Three drill holes were made in the patella.  At this particular time, I then water picked out the knee and removed all the trial components and cemented all 3 components in simultaneously.  I first injected a mixture of Exparel and normal saline, 20 mL of each, I injected half that into the soft tissue structures before I inserted my prosthesis.  Prosthesis was cemented in the usual fashion.  A weighted total cement was hardened, I then removed all loose pieces of cement.  I water picked the posterior aspect of the knee out to make sure there were no loose pieces posteriorly.  We trimmed the cement from all surfaces including the patella, tibia, and femur.  Following that, I then removed the trial insert and I inserted my permanent rotating platform size 4, 10 mm  thickness.  I reduced the knee and then inserted a Hemovac drain and closed the knee in layers in usual fashion.  The patient did have 2 g of IV Ancef preop.          ______________________________ Georges Lynch. Darrelyn Hillock, M.D.     RAG/MEDQ  D:  08/01/2012  T:  08/02/2012  Job:  [id number]  cc:   Jonelle Sidle, MD

## 2012-08-02 NOTE — Progress Notes (Signed)
Physical Therapy Treatment Patient Details Name: [name] [name] MRN: [id number] DOB: 06-17-1936 Today's Date: 08/02/2012 Time: 4098-1191 PT Time Calculation (min): 25 min  PT Assessment / Plan / Recommendation Comments on Treatment Session  Pt continues ltd by fatigue (? MEds): Increased time, cueing and physical assist required for all tasks    Follow Up Recommendations  Home health PT    Barriers to Discharge        Equipment Recommendations       Recommendations for Other Services OT consult  Frequency 7X/week   Plan Discharge plan remains appropriate    Precautions / Restrictions Precautions Precautions: Knee Required Braces or Orthoses: Knee Immobilizer - Left Knee Immobilizer - Left: Discontinue once straight leg raise with < 10 degree lag Restrictions Weight Bearing Restrictions: No Other Position/Activity Restrictions: WBAT   Pertinent Vitals/Pain     Mobility  Bed Mobility Bed Mobility: Supine to Sit;Sit to Supine Supine to Sit: 1: +2 Total assist Supine to Sit: Patient Percentage: 50% Sit to Supine: 1: +2 Total assist Sit to Supine: Patient Percentage: 60% Details for Bed Mobility Assistance: cues for sequence and for use of RLE to self assist Transfers Transfers: Sit to Stand;Stand to Sit Sit to Stand: 1: +2 Total assist Sit to Stand: Patient Percentage: 50% Stand to Sit: 1: +2 Total assist Stand to Sit: Patient Percentage: 60% Details for Transfer Assistance: cues for use of UEs and for LE management Ambulation/Gait Ambulation/Gait Assistance: 1: +2 Total assist Ambulation/Gait: Patient Percentage: 60% Ambulation Distance (Feet): 3 Feet (3' and 4') Assistive device: Rolling walker Ambulation/Gait Assistance Details: cues for posture, increased UE WB, position from RW and sequence Gait Pattern: Step-to pattern General Gait Details: Pt with Bil LE buckling with attempts to ambulate.      Exercises     PT Diagnosis:    PT Problem List:   PT  Treatment Interventions:     PT Goals Acute Rehab PT Goals PT Goal Formulation: With patient Time For Goal Achievement: 08/08/12 Potential to Achieve Goals: Fair Pt will go Supine/Side to Sit: with supervision PT Goal: Supine/Side to Sit - Progress: Goal set today Pt will go Sit to Supine/Side: with supervision PT Goal: Sit to Supine/Side - Progress: Goal set today Pt will go Sit to Stand: with supervision PT Goal: Sit to Stand - Progress: Goal set today Pt will go Stand to Sit: with supervision PT Goal: Stand to Sit - Progress: Goal set today Pt will Ambulate: 51 - 150 feet;with supervision;with standard walker;with rolling walker PT Goal: Ambulate - Progress: Goal set today Pt will Go Up / Down Stairs: 3-5 stairs;with min assist;with least restrictive assistive device PT Goal: Up/Down Stairs - Progress: Goal set today Pt will Perform Home Exercise Program: with min assist PT Goal: Perform Home Exercise Program - Progress: Goal set today  Visit Information  Last PT Received On: 08/02/12 Assistance Needed: +2    Subjective Data  Subjective: I'll try to get up Patient Stated Goal: Resume previous lifestyle with decreased pain   Cognition  Overall Cognitive Status: Appears within functional limits for tasks assessed/performed Arousal/Alertness: Awake/alert Orientation Level: Appears intact for tasks assessed Behavior During Session: [facility] for tasks performed    Balance     End of Session PT - End of Session Equipment Utilized During Treatment: Right knee immobilizer;Gait belt Activity Tolerance: Patient limited by fatigue Patient left: in bed;with call bell/phone within reach Nurse Communication: Other (comment)   GP     Enna Warwick 08/02/2012, 4:16  PM   

## 2012-08-02 NOTE — Evaluation (Signed)
Physical Therapy Evaluation Patient Details Name: [name] [name] MRN: [id number] DOB: 19-Mar-1936 Today's Date: 08/02/2012 Time: 4098-1191 PT Time Calculation (min): 21 min  PT Assessment / Plan / Recommendation Clinical Impression  Pt s/p L TKR presents with decreased L LE strength/ROM and elevated pain/nausea limiting functional mobility    PT Assessment  Patient needs continued PT services    Follow Up Recommendations  Home health PT    Barriers to Discharge        Equipment Recommendations       Recommendations for Other Services OT consult   Frequency 7X/week    Precautions / Restrictions Precautions Precautions: Knee Required Braces or Orthoses: Knee Immobilizer - Left Knee Immobilizer - Left: Discontinue once straight leg raise with < 10 degree lag Restrictions Weight Bearing Restrictions: No Other Position/Activity Restrictions: WBAT   Pertinent Vitals/Pain       Mobility  Bed Mobility Bed Mobility:  (OOB deferred 2* pt's ongoing pain/fatigue/nausea)    Exercises Total Joint Exercises Ankle Circles/Pumps: AROM;Both;10 reps;Supine Quad Sets: AROM;Both;10 reps;Supine Heel Slides: AAROM;10 reps;Supine;Left Straight Leg Raises: AAROM;10 reps;Supine;Left   PT Diagnosis: Difficulty walking  PT Problem List: Decreased strength;Decreased range of motion;Decreased activity tolerance;Decreased mobility;Pain;Decreased knowledge of use of DME PT Treatment Interventions: DME instruction;Gait training;Stair training;Functional mobility training;Therapeutic activities;Therapeutic exercise;Patient/family education   PT Goals Acute Rehab PT Goals PT Goal Formulation: With patient Time For Goal Achievement: 08/08/12 Potential to Achieve Goals: Fair Pt will go Supine/Side to Sit: with supervision PT Goal: Supine/Side to Sit - Progress: Goal set today Pt will go Sit to Supine/Side: with supervision PT Goal: Sit to Supine/Side - Progress: Goal set today Pt will go Sit  to Stand: with supervision PT Goal: Sit to Stand - Progress: Goal set today Pt will go Stand to Sit: with supervision PT Goal: Stand to Sit - Progress: Goal set today Pt will Ambulate: 51 - 150 feet;with supervision;with standard walker;with rolling walker PT Goal: Ambulate - Progress: Goal set today Pt will Go Up / Down Stairs: 3-5 stairs;with min assist;with least restrictive assistive device PT Goal: Up/Down Stairs - Progress: Goal set today Pt will Perform Home Exercise Program: with min assist PT Goal: Perform Home Exercise Program - Progress: Goal set today  Visit Information  Last PT Received On: 08/02/12 Assistance Needed: +1    Subjective Data  Subjective: I've been nauseous but I'll do a little bit Patient Stated Goal: Resume previous lifestyle with decreased pain   Prior Functioning  Home Living Lives With: Alone Available Help at Discharge: Family Type of Home: Mobile home Home Access: Stairs to enter Secretary/administrator of Steps: 3 Entrance Stairs-Rails: Right;Left;Can reach both Home Layout: One level Home Adaptive Equipment: Walker - standard Prior Function Level of Independence: Independent with assistive device(s) Able to Take Stairs?: Yes Vocation: Retired Musician: No difficulties    Cognition  Overall Cognitive Status: Appears within functional limits for tasks assessed/performed Arousal/Alertness: Awake/alert Orientation Level: Appears intact for tasks assessed Behavior During Session: [facility] for tasks performed    Extremity/Trunk Assessment Right Upper Extremity Assessment RUE ROM/Strength/Tone: [facility] for tasks assessed Left Upper Extremity Assessment LUE ROM/Strength/Tone: WFL for tasks assessed Right Lower Extremity Assessment RLE ROM/Strength/Tone: [facility] for tasks assessed Left Lower Extremity Assessment LLE ROM/Strength/Tone: Deficits LLE ROM/Strength/Tone Deficits: 2/5 quads, AAROM at knee -10 - 30 Pain limited with muscle  guarding   Balance    End of Session PT - End of Session Activity Tolerance: Patient limited by fatigue;Patient limited by pain;Other (comment)  Patient left: in bed;with call bell/phone within reach Nurse Communication: Other (comment)  GP     Mayline Dragon 08/02/2012, 1:09 PM

## 2012-08-03 DIAGNOSIS — D62 Acute posthemorrhagic anemia: Secondary | ICD-10-CM | POA: Diagnosis not present

## 2012-08-03 LAB — BASIC METABOLIC PANEL
Calcium: 8.1 mg/dL — ABNORMAL LOW (ref 8.4–10.5)
GFR calc Af Amer: 38 mL/min — ABNORMAL LOW (ref >90)
GFR calc non Af Amer: 33 mL/min — ABNORMAL LOW (ref >90)
Potassium: 4.7 mEq/L (ref 3.5–5.1)
Sodium: 132 mEq/L — ABNORMAL LOW (ref 135–145)

## 2012-08-03 LAB — CBC
MCHC: 34.2 g/dL (ref 30.0–36.0)
RDW: 13.7 % (ref 11.5–15.5)

## 2012-08-03 NOTE — Clinical Social Work Psychosocial (Signed)
     Clinical Social Work Department BRIEF PSYCHOSOCIAL ASSESSMENT 08/03/2012  Patient:  [name] [name], [name] [name]     Account Number:  [id number]     Admit date:  08/01/2012  Clinical Social Worker:  Robin Searing  Date/Time:  08/03/2012 02:24 PM  Referred by:  Physician  Date Referred:  08/03/2012 Referred for  SNF Placement   Other Referral:   Interview type:  Patient Other interview type:    PSYCHOSOCIAL DATA Living Status:  FAMILY Admitted from facility:   Level of care:   Primary support name:  wife, son Primary support relationship to patient:  FAMILY Degree of support available:   good but minimal physical assistance due to wife and son's medical ailments    CURRENT CONCERNS Current Concerns  Post-Acute Placement   Other Concerns:    SOCIAL WORK ASSESSMENT / PLAN Patient is s/p TKR- agrees to ST SNF if needed at d/c. He lives in [place] and would prefer to be close to family/home. Discussed coverage and insurance auth which will be required for SNF placement-   Assessment/plan status:  Other - See comment Other assessment/ plan:   Faxing FL2 and PASARR for SNF at d/c- will also proceed with SNF authorization via [employer].   Information/referral to community resources:   SNF    PATIENTS/FAMILYS RESPONSE TO PLAN OF CARE: Patient agrees to above and is motivated to continue his recovery and rehab.

## 2012-08-03 NOTE — Progress Notes (Signed)
Physical Therapy Treatment Patient Details Name: [name] [name] MRN: [id number] DOB: 1936/03/26 Today's Date: 08/03/2012 Time: 9562-1308 PT Time Calculation (min): 29 min  PT Assessment / Plan / Recommendation Comments on Treatment Session       Follow Up Recommendations  Skilled nursing facility    Barriers to Discharge        Equipment Recommendations  3 in 1 bedside comode;None recommended by PT;Other (comment)    Recommendations for Other Services OT consult  Frequency 7X/week   Plan Discharge plan remains appropriate    Precautions / Restrictions Precautions Precautions: Knee Required Braces or Orthoses: Knee Immobilizer - Left Knee Immobilizer - Left: Discontinue once straight leg raise with < 10 degree lag Restrictions Weight Bearing Restrictions: No Other Position/Activity Restrictions: WBAT   Pertinent Vitals/Pain     Mobility  Bed Mobility Bed Mobility: Sit to Supine Supine to Sit: 1: +2 Total assist Supine to Sit: Patient Percentage: 70% Sit to Supine: 3: Mod assist Details for Bed Mobility Assistance: cues for sequence and for use of RLE to self assist Transfers Transfers: Sit to Stand;Stand to Sit Sit to Stand: 1: +2 Total assist;From chair/3-in-1;With armrests;With upper extremity assist Sit to Stand: Patient Percentage: 70% Stand to Sit: 1: +2 Total assist;With upper extremity assist;To bed Stand to Sit: Patient Percentage: 70% Details for Transfer Assistance: cues for use of UEs and for LE management Ambulation/Gait Ambulation/Gait Assistance: 1: +2 Total assist Ambulation/Gait: Patient Percentage: 70% Ambulation Distance (Feet): 18 Feet Assistive device: Rolling walker Ambulation/Gait Assistance Details: cues for posture, sequence, stride length and position from RW Gait Pattern: Step-to pattern General Gait Details: Increased time required and multiple standing rests 2* to fatigue    Exercises Total Joint Exercises Ankle Circles/Pumps:  AROM;Both;Supine;15 reps Quad Sets: AROM;Both;Supine;15 reps Heel Slides: AAROM;Supine;Left;15 reps Straight Leg Raises: AAROM;Supine;Left;15 reps   PT Diagnosis:    PT Problem List:   PT Treatment Interventions:     PT Goals Acute Rehab PT Goals PT Goal Formulation: With patient Time For Goal Achievement: 08/08/12 Potential to Achieve Goals: Fair Pt will go Supine/Side to Sit: with supervision PT Goal: Supine/Side to Sit - Progress: Progressing toward goal Pt will go Sit to Supine/Side: with supervision PT Goal: Sit to Supine/Side - Progress: Progressing toward goal Pt will go Sit to Stand: with supervision PT Goal: Sit to Stand - Progress: Progressing toward goal Pt will go Stand to Sit: with supervision PT Goal: Stand to Sit - Progress: Progressing toward goal Pt will Ambulate: 51 - 150 feet;with supervision;with standard walker;with rolling walker PT Goal: Ambulate - Progress: Progressing toward goal Pt will Go Up / Down Stairs: 3-5 stairs;with min assist;with least restrictive assistive device PT Goal: Up/Down Stairs - Progress: Discontinued (comment) Pt will Perform Home Exercise Program: with min assist PT Goal: Perform Home Exercise Program - Progress: Progressing toward goal  Visit Information  Last PT Received On: 10/23/12 Assistance Needed: +2 PT/OT Co-Evaluation/Treatment: Yes    Subjective Data  Patient Stated Goal: Resume previous lifestyle with decreased pain   Cognition  Overall Cognitive Status: Difficult to assess Arousal/Alertness: Awake/alert Orientation Level: Appears intact for tasks assessed Behavior During Session: [facility] for tasks performed    Balance     End of Session PT - End of Session Equipment Utilized During Treatment: Right knee immobilizer;Gait belt Patient left: in bed;with call bell/phone within reach;with family/visitor present Nurse Communication: Mobility status   GP     Taeshaun Rames 08/03/2012, 4:42 PM

## 2012-08-03 NOTE — Evaluation (Signed)
Occupational Therapy Evaluation Patient Details Name: [name] [name] MRN: [id number] DOB: 02-Jul-1936 Today's Date: 08/03/2012 Time: 1030-1100 OT Time Calculation (min): 30 min  OT Assessment / Plan / Recommendation Clinical Impression  Pt s/p L TKA and displays decreased strength, functional mobility and ADL. Will beneift from skilled OT services to improve ADL independence.     OT Assessment  Patient needs continued OT Services    Follow Up Recommendations  Skilled nursing facility    Barriers to Discharge      Equipment Recommendations  3 in 1 bedside comode;None recommended by PT;Other (comment)    Recommendations for Other Services    Frequency  Min 1X/week    Precautions / Restrictions Precautions Precautions: Knee Required Braces or Orthoses: Knee Immobilizer - Left Knee Immobilizer - Left: Discontinue once straight leg raise with < 10 degree lag Restrictions Weight Bearing Restrictions: No Other Position/Activity Restrictions: WBAT        ADL  Eating/Feeding: Simulated;Independent Where Assessed - Eating/Feeding: Chair Grooming: Performed;Wash/dry face;Set up Where Assessed - Grooming: Supported sitting Upper Body Bathing: Simulated;Chest;Right arm;Left arm;Abdomen;Minimal assistance Where Assessed - Upper Body Bathing: Unsupported sitting Lower Body Bathing: +2 Total assistance Lower Body Bathing: Patient Percentage: 40% Where Assessed - Lower Body Bathing: Supported sit to stand Upper Body Dressing: Simulated;Minimal assistance Where Assessed - Upper Body Dressing: Unsupported sitting Lower Body Dressing: +2 Total assistance;Simulated Lower Body Dressing: Patient Percentage: 20% Where Assessed - Lower Body Dressing: Supported sit to stand Toilet Transfer: Simulated;+2 Total assistance Toilet Transfer: Patient Percentage: 70% Toilet Transfer Method: Sit to stand;Other (comment) (several steps to bathroom door then chair pulled up) Toileting - Clothing  Manipulation and Hygiene: Simulated;+2 Total assistance Toileting - Clothing Manipulation and Hygiene: Patient Percentage: 0% Where Assessed - Toileting Clothing Manipulation and Hygiene: Sit to stand from 3-in-1 or toilet;Other (comment) (not able to let go of RW to use UEs.) Tub/Shower Transfer Method: Not assessed Equipment Used: Rolling walker ADL Comments: Cotx with PT. Pt fatigues easily. discussed going to ST SNF and pt agreeable.     OT Diagnosis: Generalized weakness;Acute pain  OT Problem List: Decreased strength;Decreased range of motion;Decreased activity tolerance;Decreased knowledge of use of DME or AE;Pain OT Treatment Interventions: Self-care/ADL training;Therapeutic activities;DME and/or AE instruction;Patient/family education   OT Goals Acute Rehab OT Goals OT Goal Formulation: With patient Time For Goal Achievement: 08/10/12 Potential to Achieve Goals: Good ADL Goals Pt Will Perform Grooming: with mod assist;Standing at sink ADL Goal: Grooming - Progress: Goal set today Pt Will Perform Upper Body Bathing: Sitting, edge of bed;Sitting, chair;with set-up ADL Goal: Upper Body Bathing - Progress: Goal set today Pt Will Perform Lower Body Bathing: with mod assist;Sit to stand from chair;Sit to stand from bed ADL Goal: Lower Body Bathing - Progress: Goal set today Pt Will Transfer to Toilet: with mod assist;with DME;3-in-1;Stand pivot transfer ADL Goal: Toilet Transfer - Progress: Goal set today Pt Will Perform Toileting - Clothing Manipulation: with mod assist;Standing ADL Goal: Toileting - Clothing Manipulation - Progress: Goal set today  Visit Information  Last OT Received On: 08/03/12 Assistance Needed: +2 PT/OT Co-Evaluation/Treatment: Yes    Subjective Data  Subjective: better today Patient Stated Goal: agreeable to therapy; none stated.    Prior Functioning     Home Living Lives With: Alone Available Help at Discharge: Family Type of Home: Mobile  home Home Access: Stairs to enter Entrance Stairs-Number of Steps: 3 Entrance Stairs-Rails: Right;Left;Can reach both Home Layout: One level Bathroom Shower/Tub: Engineer, manufacturing systems:  Standard Home Adaptive Equipment: Walker - standard;Shower chair with back Prior Function Level of Independence: Independent with assistive device(s) Able to Take Stairs?: Yes Vocation: Retired Musician: No difficulties         Vision/Perception     Cognition  Overall Cognitive Status: Appears within functional limits for tasks assessed/performed Arousal/Alertness: Awake/alert Orientation Level: Appears intact for tasks assessed Behavior During Session: [facility] for tasks performed    Extremity/Trunk Assessment Right Upper Extremity Assessment RUE ROM/Strength/Tone: [facility] for tasks assessed Left Upper Extremity Assessment LUE ROM/Strength/Tone: WFL for tasks assessed     Mobility Bed Mobility Bed Mobility: Supine to Sit Supine to Sit: 1: +2 Total assist Supine to Sit: Patient Percentage: 70% Details for Bed Mobility Assistance: cues for sequence and for use of RLE to self assist Transfers Sit to Stand: 1: +2 Total assist;From elevated surface Sit to Stand: Patient Percentage: 70% Stand to Sit: 1: +2 Total assist;To chair/3-in-1;With armrests;With upper extremity assist Stand to Sit: Patient Percentage: 70% Details for Transfer Assistance: cues for use of UEs and for LE management     Shoulder Instructions     Exercise     Balance     End of Session OT - End of Session Equipment Utilized During Treatment: Gait belt Activity Tolerance: Patient limited by fatigue;Patient limited by pain Patient left: in chair;with call bell/phone within reach  GO     Lennox Laity [phone] 08/03/2012, 12:47 PM

## 2012-08-03 NOTE — Progress Notes (Signed)
Physical Therapy Treatment Patient Details Name: [name] [name] MRN: [id number] DOB: 02-12-1936 Today's Date: 08/03/2012 Time: 3086-5784 PT Time Calculation (min): 20 min  PT Assessment / Plan / Recommendation Comments on Treatment Session  Pt continues to require significant physical assist to accomplish limited tasks.  Spoke with pt regarding rehab setting post hospital stay and pt is willing to consider.  Social Worker made aware    Follow Up Recommendations  Skilled nursing facility    Barriers to Discharge        Equipment Recommendations  None recommended by PT    Recommendations for Other Services OT consult  Frequency 7X/week   Plan Discharge plan needs to be updated    Precautions / Restrictions Precautions Precautions: Knee Required Braces or Orthoses: Knee Immobilizer - Left Knee Immobilizer - Left: Discontinue once straight leg raise with < 10 degree lag Restrictions Weight Bearing Restrictions: No Other Position/Activity Restrictions: WBAT   Pertinent Vitals/Pain 8/10; premedicated, ice packs provided, RN aware    Mobility  Bed Mobility Bed Mobility: Supine to Sit Supine to Sit: 1: +2 Total assist Supine to Sit: Patient Percentage: 70% Details for Bed Mobility Assistance: cues for sequence and for use of RLE to self assist Transfers Transfers: Sit to Stand;Stand to Sit Sit to Stand: 1: +2 Total assist;From elevated surface Sit to Stand: Patient Percentage: 70% Stand to Sit: 1: +2 Total assist;To chair/3-in-1;With armrests;With upper extremity assist Stand to Sit: Patient Percentage: 70% Details for Transfer Assistance: cues for use of UEs and for LE management Ambulation/Gait Ambulation/Gait Assistance: 1: +2 Total assist Ambulation/Gait: Patient Percentage: 70% Ambulation Distance (Feet): 9 Feet Assistive device: Rolling walker Ambulation/Gait Assistance Details: cues for posture, sequence, stride length, position from RW Gait Pattern: Step-to  pattern General Gait Details: Increased time required and multiple standing rests 2* to fatigue    Exercises     PT Diagnosis:    PT Problem List:   PT Treatment Interventions:     PT Goals Acute Rehab PT Goals PT Goal Formulation: With patient Time For Goal Achievement: 08/08/12 Potential to Achieve Goals: Fair Pt will go Supine/Side to Sit: with supervision PT Goal: Supine/Side to Sit - Progress: Progressing toward goal Pt will go Sit to Supine/Side: with supervision PT Goal: Sit to Supine/Side - Progress: Progressing toward goal Pt will go Sit to Stand: with supervision PT Goal: Sit to Stand - Progress: Progressing toward goal Pt will go Stand to Sit: with supervision PT Goal: Stand to Sit - Progress: Progressing toward goal Pt will Ambulate: 51 - 150 feet;with supervision;with standard walker;with rolling walker PT Goal: Ambulate - Progress: Progressing toward goal  Visit Information  Last PT Received On: 08/03/12 Assistance Needed: +2 PT/OT Co-Evaluation/Treatment: Yes    Subjective Data  Subjective: I'm doing a little better than yesterday Patient Stated Goal: Resume previous lifestyle with decreased pain   Cognition  Overall Cognitive Status: Appears within functional limits for tasks assessed/performed Arousal/Alertness: Awake/alert Orientation Level: Appears intact for tasks assessed Behavior During Session: [facility] for tasks performed    Balance     End of Session PT - End of Session Equipment Utilized During Treatment: Right knee immobilizer;Gait belt Activity Tolerance: Patient limited by fatigue Patient left: with call bell/phone within reach;in chair Nurse Communication: Mobility status   GP     [name] [name] 08/03/2012, 12:35 PM

## 2012-08-03 NOTE — Clinical Social Work Placement (Signed)
     Clinical Social Work Department CLINICAL SOCIAL WORK PLACEMENT NOTE 08/03/2012  Patient:  COLLINS, KERBY  Account Number:  [id number] Admit date:  08/01/2012  Clinical Social Worker:  Robin Searing  Date/time:  08/03/2012 02:29 PM  Clinical Social Work is seeking post-discharge placement for this patient at the following level of care:   SKILLED NURSING   (*CSW will update this form in Epic as items are completed)   08/03/2012  Patient/family provided with [facility] Department of Clinical Social Works list of facilities offering this level of care within the geographic area requested by the patient (or if unable, by the patients family).  08/03/2012  Patient/family informed of their freedom to choose among providers that offer the needed level of care, that participate in Medicare, Medicaid or managed care program needed by the patient, have an available bed and are willing to accept the patient.  08/03/2012  Patient/family informed of [facility] ownership interest in [facility], as well as of the fact that they are under no obligation to receive care at this facility.  PASARR submitted to [employer] on 08/03/2012 PASARR number received from [employer] on   FL2 transmitted to all facilities in geographic area requested by pt/family on  08/03/2012 FL2 transmitted to all facilities within larger geographic area on   Patient informed that his/her managed care company has contracts with or will negotiate with  certain facilities, including the following:     Patient/family informed of bed offers received:   Patient chooses bed at  Physician recommends and patient chooses bed at    Patient to be transferred to  on   Patient to be transferred to facility by   The following physician request were entered in Epic:   Additional Comments:

## 2012-08-03 NOTE — Progress Notes (Signed)
Subjective: Doing very well today. Dressing changed and wound looks fine.Acute Blood loss anemia   Objective: Vital signs in last 24 hours: Temp:  [97.8 F (36.6 C)-99 F (37.2 C)] 97.8 F (36.6 C) (09/25 0455) Pulse Rate:  [67-75] 67  (09/25 0455) Resp:  [16-20] 20  (09/25 0455) BP: (93-136)/(63-75) 93/63 mmHg (09/25 0455) SpO2:  [92 %-96 %] 92 % (09/25 0455)  Intake/Output from previous day: 09/24 0701 - 09/25 0700 In: 1320 [P.O.:120; I.V.:1200] Out: 1425 [Urine:1425] Intake/Output this shift: Total I/O In: 120 [P.O.:120] Out: 400 [Urine:400]   Basename 08/03/12 0355 08/02/12 0429  HGB 10.2* 12.0*    Basename 08/03/12 0355 08/02/12 0429  WBC 11.4* 8.8  RBC 3.69* 4.44  HCT 29.8* 36.1*  PLT 132* 169    Basename 08/03/12 0355 08/02/12 0429  NA 132* 132*  K 4.7 5.6*  CL 99 99  CO2 27 27  BUN 16 15  CREATININE 1.94* 1.51*  GLUCOSE 106* 109*  CALCIUM 8.1* 8.3*   No results found for this basename: LABPT:2,INR:2 in the last 72 hours  Neurovascular intact Dorsiflexion/Plantar flexion intact No cellulitis present  Assessment/Plan: DC Thur. Or Friday.   Lahoma Constantin A 08/03/2012, 9:48 AM

## 2012-08-04 DIAGNOSIS — D62 Acute posthemorrhagic anemia: Secondary | ICD-10-CM | POA: Diagnosis not present

## 2012-08-04 LAB — BASIC METABOLIC PANEL
GFR calc non Af Amer: 31 mL/min — ABNORMAL LOW (ref >90)
Glucose, Bld: 123 mg/dL — ABNORMAL HIGH (ref 70–99)
Potassium: 4.4 mEq/L (ref 3.5–5.1)
Sodium: 132 mEq/L — ABNORMAL LOW (ref 135–145)

## 2012-08-04 LAB — CBC
Hemoglobin: 9.5 g/dL — ABNORMAL LOW (ref 13.0–17.0)
MCHC: 34.3 g/dL (ref 30.0–36.0)
Platelets: 128 10*3/uL — ABNORMAL LOW (ref 150–400)
RBC: 3.45 MIL/uL — ABNORMAL LOW (ref 4.22–5.81)

## 2012-08-04 MED ORDER — RIVAROXABAN 10 MG PO TABS: 10.0000 mg | ORAL_TABLET | Freq: Every day | ORAL | Status: DC

## 2012-08-04 MED ORDER — OXYCODONE-ACETAMINOPHEN 5-325 MG PO TABS: 1.0000 | ORAL_TABLET | ORAL | Status: DC | PRN

## 2012-08-04 MED ORDER — METHOCARBAMOL 500 MG PO TABS: 500.0000 mg | ORAL_TABLET | Freq: Four times a day (QID) | ORAL | Status: DC | PRN

## 2012-08-04 MED ORDER — FLEET ENEMA 7-19 GM/118ML RE ENEM
1.0000 | ENEMA | Freq: Once | RECTAL | Status: AC
  Administered 2012-08-04: 1 via RECTAL
  Filled 2012-08-04: qty 1

## 2012-08-04 NOTE — Progress Notes (Signed)
CSW assisting with d/c planning. Pt's daughter will review bed offers with pt in the am . Both [facility] and [facility]  have offered beds for Fri. [employer] insurance will approve ST SNF placement. CSW will assist with d/c planning to SNF 08/05/12.  Cori Razor LCSW [phone]

## 2012-08-04 NOTE — Progress Notes (Signed)
Patient:  [name] [name], [name] [name]   Account Number:  [id number]  Date Initiated:  08/02/2012  Documentation initiated by:  Colleen Can  Subjective/Objective Assessment:   dx osteoarthritis  left knee; total knee replacemnt     Action/Plan:   Home with hh services vs SNF rehab  Pt states he lives with spouse and son but they are in poor health. States his daughter will make decision regarding his care. snf  or hh-   Anticipated DC Date:  08/04/2012   Anticipated DC Plan:  SKILLED NURSING FACILITY  In-house referral  Clinical Social Worker      DC Planning Services  CM consult      Choice offered to / List presented to:          [facility] arranged  HH-2 PT      [facility] agency  [facility]   Status of service:  In process, will continue to follow Medicare Important Message given?   (If response is "NO", the following Medicare IM given date fields will be blank) Date Medicare IM given:   Date Additional Medicare IM given:   Comments:  08/04/2012 Raynelle Bring bsn ccm Anticipate SNf rehab; CSW following case; cm will follow as needed([facility] can provide [facility] if needed)  08/02/2012 Dericka Ostenson, RN BSN CCM PER NOTES pt RECOMMENDING SNF/CM WILL FOLLOW PT PROGRESS.

## 2012-08-04 NOTE — Progress Notes (Signed)
Physical Therapy Treatment Patient Details Name: [name] [name] MRN: [id number] DOB: December 02, 1935 Today's Date: 08/04/2012 Time: 8295-6213 PT Time Calculation (min): 20 min  PT Assessment / Plan / Recommendation Comments on Treatment Session  POD #3 pm session.  Assisted pt from recliner to [facility] with increased time and + 2 assist.  Pt progressing slowly and c/o MAX ABD discofort/distention with inability to have a BM. Unable to amb pt as RN gave pt an enema.    Follow Up Recommendations  Skilled nursing facility    Barriers to Discharge        Equipment Recommendations  None recommended by PT;3 in 1 bedside comode    Recommendations for Other Services    Frequency 7X/week   Plan Discharge plan remains appropriate    Precautions / Restrictions Precautions Precautions: Knee Precaution Comments: Pt instructed on KI use for amb Required Braces or Orthoses: Knee Immobilizer - Left Knee Immobilizer - Left: Discontinue once straight leg raise with < 10 degree lag Restrictions Weight Bearing Restrictions: No Other Position/Activity Restrictions: WBAT    Pertinent Vitals/Pain C/o MAX fatigue    Mobility  Bed Mobility Bed Mobility: Not assessed Details for Bed Mobility Assistance: Pt OOB in recliner  Transfers Transfers: Sit to Stand;Stand to Sit Sit to Stand: 1: +2 Total assist;From chair/3-in-1 Sit to Stand: Patient Percentage: 70% Stand to Sit: 1: +2 Total assist;To toilet Stand to Sit: Patient Percentage: 70% Details for Transfer Assistance: 50% VC's on proper tech and hand placement. Initial posterior LOB Assisted pt from recliner to [facility].  Ambulation/Gait Ambulation/Gait Assistance: Not tested (comment) Ambulation/Gait: Patient Percentage: 70% Ambulation Distance (Feet): 10 Feet Assistive device: Rolling walker Ambulation/Gait Assistance Details: 50% VC's on proper advancement of RW, proper sequencing and upright posture. Very unsteady gait. Gait Pattern: Step-to  pattern;Decreased stance time - left;Trunk flexed Gait velocity: decreased         PT Goals       progressing    Visit Information  Last PT Received On: 08/04/12 Assistance Needed: +2    Subjective Data      Cognition       Balance     End of Session PT - End of Session Equipment Utilized During Treatment: Gait belt;Left knee immobilizer Activity Tolerance: Treatment limited secondary to medical complications (Comment) (c/o constipation and bloated ABD) Patient left: on [facility] w/ student RN;with call bell/phone within reach   Felecia Shelling  PTA [facility]  Acute  Rehab Pager     [phone]

## 2012-08-04 NOTE — Progress Notes (Signed)
Physical Therapy Treatment Patient Details Name: JOSHUE HAECKER MRN: [id number] DOB: 01-Mar-1936 Today's Date: 08/04/2012 Time: 8295-6213 PT Time Calculation (min): 27 min  PT Assessment / Plan / Recommendation Comments on Treatment Session  POD #3 am session.  Pt progressing slowly.  Still requires + 2 for safety.  Very limited amb distance this am and MAX c/o pain. Pt lives with spouse but she is unable to physically assist stated pt. Pt concidering SNF but hopeful to go home.  Dghtr feels pt lost a day of therapy due to his nausea.    Follow Up Recommendations  Skilled nursing facility    Barriers to Discharge        Equipment Recommendations  None recommended by PT;3 in 1 bedside comode    Recommendations for Other Services    Frequency 7X/week   Plan Discharge plan remains appropriate    Precautions / Restrictions Precautions Precautions: Knee Precaution Comments: Pt instructed on KI use for amb Required Braces or Orthoses: Knee Immobilizer - Left Knee Immobilizer - Left: Discontinue once straight leg raise with < 10 degree lag Restrictions Weight Bearing Restrictions: No Other Position/Activity Restrictions: WBAT    Pertinent Vitals/Pain C/o 10/10 Declines pain meds because he doesn't like how it makes him feel ICE applied    Mobility  Bed Mobility Bed Mobility: Not assessed Details for Bed Mobility Assistance: Pt OOB in recliner  Transfers Transfers: Sit to Stand;Stand to Sit Sit to Stand: 1: +2 Total assist;From chair/3-in-1 Sit to Stand: Patient Percentage: 70% Stand to Sit: 1: +2 Total assist;To chair/3-in-1 Stand to Sit: Patient Percentage: 70% Details for Transfer Assistance: 50% VC's on proper tech and hand placement.  Initial posterior LOB.  Ambulation/Gait Ambulation/Gait Assistance: 1: +2 Total assist Ambulation/Gait: Patient Percentage: 70% Ambulation Distance (Feet): 10 Feet Assistive device: Rolling walker Ambulation/Gait Assistance Details:  50% VC's on proper advancement of RW, proper sequencing and upright posture. Very unsteady gait. Gait Pattern: Step-to pattern;Decreased stance time - left;Trunk flexed Gait velocity: decreased    Exercises Total Joint Exercises Ankle Circles/Pumps: AROM;Both;10 reps;Supine Quad Sets: AROM;Both;10 reps;Supine Gluteal Sets: AROM;Both;10 reps;Supine Towel Squeeze: AROM;Both;10 reps;Supine Heel Slides: AAROM;Left;5 reps;Supine    PT Goals                           progressing    Visit Information  Last PT Received On: 08/04/12 Assistance Needed: +2                   End of Session PT - End of Session Equipment Utilized During Treatment: Gait belt;Left knee immobilizer Activity Tolerance: Patient limited by fatigue Patient left: in chair;with call bell/phone within reach;with family/visitor present  Felecia Shelling  PTA [facility]  Acute  Rehab Pager     [phone]

## 2012-08-04 NOTE — Progress Notes (Signed)
Subjective: Very slow in his ambulation. He will require SNF. He has Acute Blood Loss Anemia.   Objective: Vital signs in last 24 hours: Temp:  [98.5 F (36.9 C)-99.2 F (37.3 C)] 98.5 F (36.9 C) (09/26 0647) Pulse Rate:  [69-76] 69  (09/26 0647) Resp:  [18-20] 18  (09/26 0647) BP: (93-116)/(59-69) 108/61 mmHg (09/26 0647) SpO2:  [90 %-95 %] 92 % (09/26 0647)  Intake/Output from previous day: 09/25 0701 - 09/26 0700 In: 2740 [P.O.:340; I.V.:2400] Out: 1225 [Urine:1225] Intake/Output this shift:     Basename 08/04/12 0400 08/03/12 0355 08/02/12 0429  HGB 9.5* 10.2* 12.0*    Basename 08/04/12 0400 08/03/12 0355  WBC 9.8 11.4*  RBC 3.45* 3.69*  HCT 27.7* 29.8*  PLT 128* 132*    Basename 08/04/12 0400 08/03/12 0355  NA 132* 132*  K 4.4 4.7  CL 99 99  CO2 26 27  BUN 23 16  CREATININE 2.03* 1.94*  GLUCOSE 123* 106*  CALCIUM 8.6 8.1*   No results found for this basename: LABPT:2,INR:2 in the last 72 hours  Neurovascular intact Dorsiflexion/Plantar flexion intact  Assessment/Plan: SNF for Friday.   [name] [name] A 08/04/2012, 7:21 AM

## 2012-08-05 LAB — BASIC METABOLIC PANEL
BUN: 25 mg/dL — ABNORMAL HIGH (ref 6–23)
Chloride: 99 mEq/L (ref 96–112)
GFR calc Af Amer: 39 mL/min — ABNORMAL LOW (ref >90)
GFR calc non Af Amer: 34 mL/min — ABNORMAL LOW (ref >90)
Potassium: 4.1 mEq/L (ref 3.5–5.1)
Sodium: 132 mEq/L — ABNORMAL LOW (ref 135–145)

## 2012-08-05 NOTE — Progress Notes (Signed)
09//27/2013 Raynelle Bring BSN CCM [id number] PLANS ARE FOR PAT TO DISCHARGE TO SNF TODAY-CM SIGNING OFF.

## 2012-08-05 NOTE — Progress Notes (Signed)
   Subjective: 4 Days Post-Op Procedure(s) (LRB): TOTAL KNEE ARTHROPLASTY (Left) Patient reports pain as mild.   Patient seen in rounds without Dr. Darrelyn Hillock. Patient is well, and has had no acute complaints or problems. He is eating during rounds this morning. No complaints of chest pain or shortness of breath. He is voiding well and had a bowel movement yesterday. He is ready to be discharged to SNF today. He reports that his daughter has been helping make the arrangements for him. He walked with therapy yesterday.   Objective: Vital signs in last 24 hours: Temp:  [98.3 F (36.8 C)-99.3 F (37.4 C)] 98.3 F (36.8 C) (09/27 0519) Pulse Rate:  [70-74] 71  (09/27 0519) Resp:  [18-20] 18  (09/27 0519) BP: (110-127)/(63-68) 127/67 mmHg (09/27 0519) SpO2:  [93 %-94 %] 93 % (09/27 0519)  Intake/Output from previous day:  Intake/Output Summary (Last 24 hours) at 08/05/12 0740 Last data filed at 08/05/12 0518  Gross per 24 hour  Intake    480 ml  Output    951 ml  Net   -471 ml     Labs:  Basename 08/04/12 0400 08/03/12 0355  HGB 9.5* 10.2*    Basename 08/04/12 0400 08/03/12 0355  WBC 9.8 11.4*  RBC 3.45* 3.69*  HCT 27.7* 29.8*  PLT 128* 132*    Basename 08/05/12 0411 08/04/12 0400  NA 132* 132*  K 4.1 4.4  CL 99 99  CO2 27 26  BUN 25* 23  CREATININE 1.87* 2.03*  GLUCOSE 126* 123*  CALCIUM 8.7 8.6    EXAM General - Patient is Alert and Oriented Extremity - Neurologically intact Neurovascular intact Dorsiflexion/Plantar flexion intact Dressing/Incision - clean, dry, no drainage Motor Function - intact, moving foot and toes well on exam.   Past Medical History  Diagnosis Date  . Diverticulosis 2007    Colonoscopy  . Mixed hyperlipidemia   . Essential hypertension, benign   . Coronary atherosclerosis of native coronary artery     Multivessel and bypass graft disease  . Ischemic cardiomyopathy     LVEF 35%  . Fatty liver disease, nonalcoholic   . Chronic  constipation   . Nephrolithiasis   . Hiatal hernia   . Schatzki's ring     Last EGD/ED Dr Eliezer Champagne  . GERD (gastroesophageal reflux disease)   . Renal insufficiency   . Myocardial infarction   . Headache     occasional headache   . Arthritis     Assessment/Plan: 4 Days Post-Op Procedure(s) (LRB): TOTAL KNEE ARTHROPLASTY (Left) Principal Problem:  *S/P total knee arthroplasty Active Problems:  Osteoarthritis of left knee  Acute blood loss anemia  Anemia due to blood loss, acute   Advance diet Up with therapy D/C IV fluids Discharge to SNF DVT Prophylaxis - Xarelto Weight-Bearing as tolerated to left leg Patient has been monitored for increased BUN and creatinine. He is currently on Xarelto for DV prophylaxis. HIs BUN is up from yesterday but creatinine down. With the creatinine improving hopefully the BUN will follow. We are going to have the SNF recheck the BUN tomorrow. If BUN continues to increase we will D/C Xarelto and move patient to coumadin per pharmacy.   Braxden Lovering LAUREN 08/05/2012, 7:40 AM

## 2012-08-05 NOTE — Progress Notes (Addendum)
Physical Therapy Treatment Patient Details Name: [name] [name] MRN: [id number] DOB: May 25, 1936 Today's Date: 08/05/2012 Time: 1478-2956 PT Time Calculation (min): 18 min  PT Assessment / Plan / Recommendation Comments on Treatment Session  POD #4 am session.  Max c/o distended ABD with freq trips to BR/BSC with no success.  Assisted pt from recliner to [facility] for another attempt.  Instructed on KI use and applied.  Pt plans to D/C to SNF today via.    Follow Up Recommendations  Skilled nursing facility    Barriers to Discharge        Equipment Recommendations       Recommendations for Other Services    Frequency 7X/week   Plan Discharge plan remains appropriate    Precautions / Restrictions     Pertinent Vitals/Pain C/o distended ABD and 4/10 L knee pain    Mobility  Bed Mobility Bed Mobility: Not assessed Details for Bed Mobility Assistance: Pt OOB in recliner  Transfers Transfers: Sit to Stand;Stand to Sit Sit to Stand: 1: +2 Total assist;From chair/3-in-1 Sit to Stand: Patient Percentage: 70% Stand to Sit: 1: +2 Total assist;To toilet Stand to Sit: Patient Percentage: 70% Details for Transfer Assistance: 50% VC's for proper tech and hand placement.  Difficulty due to distended ABD.  Ambulation/Gait Ambulation/Gait Assistance: 1: +2 Total assist Ambulation/Gait: Patient Percentage: 70% Ambulation Distance (Feet): 10 Feet Assistive device: Rolling walker Ambulation/Gait Assistance Details: 50% VC's on proper advancement of RW, proper sequencing and upright posture. Very unsteady gait. Gait Pattern: Step-to pattern;Trunk flexed Gait velocity: decreased     PT Goals                 progressing    Visit Information  Last PT Received On: 08/05/12 Assistance Needed: +2                   End of Session PT - End of Session Equipment Utilized During Treatment: Gait belt Activity Tolerance: Patient limited by fatigue;Patient limited by pain Patient left: Other  (comment) (In bathroom with call light and notified NT)   Felecia Shelling  PTA [facility]  Acute  Rehab Pager     [phone]

## 2012-08-05 NOTE — Progress Notes (Signed)
Physical Therapy Treatment Patient Details Name: DARTH CRISMON MRN: [id number] DOB: August 21, 1936 Today's Date: 08/05/2012 Time: 6644-0347 PT Time Calculation (min): 11 min  PT Assessment / Plan / Recommendation Comments on Treatment Session  POD #4 pm session.  Assisted pt from recliner to wheelchair then performed a car tranfer.  Pt required + 2 assist for safety and 75% VC's on proper technique.  Daughter transporting pt to SNF.    Follow Up Recommendations  Skilled nursing facility    Barriers to Discharge        Equipment Recommendations       Recommendations for Other Services    Frequency 7X/week   Plan Discharge plan remains appropriate    Precautions / Restrictions     Pertinent Vitals/Pain C/o "soreness"    Mobility  Bed Mobility Bed Mobility: Not assessed Details for Bed Mobility Assistance: Pt OOB in recliner  Transfers Transfers: Sit to Stand;Stand to Sit Sit to Stand: 1: +2 Total assist;From chair/3-in-1;Other (comment) (wheelchair) Sit to Stand: Patient Percentage: 80% Stand to Sit: 1: +2 Total assist;Other (comment) (wheelchair, car) Stand to Sit: Patient Percentage: 80% Details for Transfer Assistance: 50% VC's for proper tech and hand placement. Difficulty due to distended ABD.  75% VC's on proper tech to get into car with increased time to scoot and max assist to support L LE.  Ambulation/Gait Ambulation/Gait Assistance: 1: +2 Total assist Ambulation/Gait: Patient Percentage: 80% Ambulation Distance (Feet): 4 Feet Assistive device: Rolling walker Ambulation/Gait Assistance Details: 50% VC's on proper advancement of RW, proper sequencing and upright posture. Very unsteady gait. Gait Pattern: Step-to pattern;Trunk flexed;Decreased stance time - left Gait velocity: decreased    PT Goals      progressing   Visit Information  Last PT Received On: 08/05/12 Assistance Needed: +2    Subjective Data      Cognition       Balance     End of  Session PT - End of Session Equipment Utilized During Treatment: Gait belt Activity Tolerance: Patient limited by fatigue Patient left: Other (comment) (car with daughter)   Felecia Shelling  PTA [facility]  Acute  Rehab Pager     [phone]

## 2012-08-05 NOTE — Discharge Summary (Signed)
Physician Discharge Summary   Patient ID: [name] [name] MRN: [id number] DOB/AGE: [birth year] 76 y.o.  Admit date: 08/01/2012 Discharge date: 08/05/2012  Primary Diagnosis:  Osteoarthritis left knee  Admission Diagnoses:  Past Medical History  Diagnosis Date  . Diverticulosis 2007    Colonoscopy  . Mixed hyperlipidemia   . Essential hypertension, benign   . Coronary atherosclerosis of native coronary artery     Multivessel and bypass graft disease  . Ischemic cardiomyopathy     LVEF 35%  . Fatty liver disease, nonalcoholic   . Chronic constipation   . Nephrolithiasis   . Hiatal hernia   . Schatzki's ring     Last EGD/ED Dr Eliezer Champagne  . GERD (gastroesophageal reflux disease)   . Renal insufficiency   . Myocardial infarction   . Headache     occasional headache   . Arthritis    Discharge Diagnoses:   Principal Problem:  *S/P total knee arthroplasty Active Problems:  Osteoarthritis of left knee  Acute blood loss anemia  Anemia due to blood loss, acute  Procedure:  Procedure(s) (LRB): TOTAL KNEE ARTHROPLASTY (Left)   Consults: None  HPI: The patient is a 76 year old male who reported left knee symptoms including pain, swelling and instability which began years ago. Symptoms are exacerbated by weight bearing and walking. He has severe bone on bone, with the left knee much worse than the right. He has a severe genu varus bilaterally. He has definite chondromalacia involving both tibial joints as well. Basically, the main problem here is bone on bone and severe pain and severe chondromalacia of both knees, left greater than the right. He has had cortisone injection with limited benefit. Due to failure of conservative measures, the most predictable means for increased function and decreased pain in the left knee is a left total knee arthroplasty.    Laboratory Data: Hospital Outpatient Visit on 07/28/2012  Component Date Value Range Status  . aPTT 07/28/2012 28  24 -  37 seconds Final  . Sodium 07/28/2012 136  135 - 145 mEq/L Final  . Potassium 07/28/2012 4.6  3.5 - 5.1 mEq/L Final  . Chloride 07/28/2012 101  96 - 112 mEq/L Final  . CO2 07/28/2012 26  19 - 32 mEq/L Final  . Glucose, Bld 07/28/2012 105* 70 - 99 mg/dL Final  . BUN 78/29/5621 19  6 - 23 mg/dL Final  . Creatinine, Ser 07/28/2012 1.70* 0.50 - 1.35 mg/dL Final  . Calcium 30/86/5784 9.4  8.4 - 10.5 mg/dL Final  . Total Protein 07/28/2012 6.7  6.0 - 8.3 g/dL Final  . Albumin 69/62/9528 3.3* 3.5 - 5.2 g/dL Final  . AST 41/32/4401 18  0 - 37 U/L Final  . ALT 07/28/2012 15  0 - 53 U/L Final  . Alkaline Phosphatase 07/28/2012 78  39 - 117 U/L Final  . Total Bilirubin 07/28/2012 0.5  0.3 - 1.2 mg/dL Final  . GFR calc non Af Amer 07/28/2012 38* >90 mL/min Final  . GFR calc Af Amer 07/28/2012 44* >90 mL/min Final   Comment:                                 The eGFR has been calculated                          using the CKD EPI equation.  This calculation has not been                          validated in all clinical                          situations.                          eGFR's persistently                          <90 mL/min signify                          possible Chronic Kidney Disease.  Marland Kitchen Prothrombin Time 07/28/2012 13.1  11.6 - 15.2 seconds Final  . INR 07/28/2012 1.00  0.00 - 1.49 Final  . Color, Urine 07/28/2012 YELLOW  YELLOW Final  . APPearance 07/28/2012 CLOUDY* CLEAR Final  . Specific Gravity, Urine 07/28/2012 1.017  1.005 - 1.030 Final  . pH 07/28/2012 5.5  5.0 - 8.0 Final  . Glucose, UA 07/28/2012 NEGATIVE  NEGATIVE mg/dL Final  . Hgb urine dipstick 07/28/2012 SMALL* NEGATIVE Final  . Bilirubin Urine 07/28/2012 NEGATIVE  NEGATIVE Final  . Ketones, ur 07/28/2012 NEGATIVE  NEGATIVE mg/dL Final  . Protein, ur 41/32/4401 NEGATIVE  NEGATIVE mg/dL Final  . Urobilinogen, UA 07/28/2012 1.0  0.0 - 1.0 mg/dL Final  . Nitrite 02/72/5366 NEGATIVE  NEGATIVE  Final  . Leukocytes, UA 07/28/2012 LARGE* NEGATIVE Final  . WBC 07/28/2012 5.9  4.0 - 10.5 K/uL Final  . RBC 07/28/2012 5.47  4.22 - 5.81 MIL/uL Final  . Hemoglobin 07/28/2012 14.9  13.0 - 17.0 g/dL Final  . HCT 44/01/4741 44.0  39.0 - 52.0 % Final  . MCV 07/28/2012 80.4  78.0 - 100.0 fL Final  . MCH 07/28/2012 27.2  26.0 - 34.0 pg Final  . MCHC 07/28/2012 33.9  30.0 - 36.0 g/dL Final  . RDW 59/56/3875 13.6  11.5 - 15.5 % Final  . Platelets 07/28/2012 214  150 - 400 K/uL Final  . MRSA, PCR 07/28/2012 POSITIVE* NEGATIVE Final  . Staphylococcus aureus 07/28/2012 POSITIVE* NEGATIVE Final   Comment:                                 The Xpert SA Assay (FDA                          approved for NASAL specimens                          in patients over 9 years of age),                          is one component of                          a comprehensive surveillance                          program.  Test performance has  been validated by [facility] for patients greater                          than or equal to 79 year old.                          It is not intended                          to diagnose infection nor to                          guide or monitor treatment.  . WBC, UA 07/28/2012 21-50  <3 WBC/hpf Final  . RBC / HPF 07/28/2012 0-2  <3 RBC/hpf Final  . Bacteria, UA 07/28/2012 RARE  RARE Final    Basename 08/04/12 0400 08/03/12 0355  HGB 9.5* 10.2*    Basename 08/04/12 0400 08/03/12 0355  WBC 9.8 11.4*  RBC 3.45* 3.69*  HCT 27.7* 29.8*  PLT 128* 132*    Basename 08/05/12 0411 08/04/12 0400  NA 132* 132*  K 4.1 4.4  CL 99 99  CO2 27 26  BUN 25* 23  CREATININE 1.87* 2.03*  GLUCOSE 126* 123*  CALCIUM 8.7 8.6    X-Rays:Nm Myocar Single W/spect W/wall Motion And Ef  07/07/2012  Ordering Physician: Remi Deter MCDOWELL  Reading Physician: Jonelle Sidle  Clinical Data: 76 year old male with known multivessel CAD  status post bypass grafting, cardiomyopathy, hyperlipidemia, and hypertension, referred as part of a preoperative evaluation to its assess for the extent and degree of ischemia.  NUCLEAR MEDICINE STRESS MYOVIEW STUDY WITH SPECT AND LEFT VENTRICULAR EJECTION FRACTION  Radionuclide Data: One-day rest/stress protocol performed with 10/30 mCi of Tc-32m Myoview.  Stress Data: Lexiscan bolus was given in standard fashion.  Heart rate increased from 45 beats per minute up to 76 beats per minute, and blood pressure increased from 100/62 up to 118/58.  He tolerated the infusion well.  Equivocal ST-segment changes were noted, no sustained arrhythmias.  EKG: Baseline ECG shows sinus bradycardia at 45 beats per minute with nonspecific ST-segment changes, poor R-wave progression.  Scintigraphic Data: Analysis of the raw perfusion data shows adequate radiotracer uptake, some diaphragmatic and other soft tissue attenuation.  Tomographic views were obtained using the short axis, vertical long axis, and horizontal long axis planes.  There is a small, moderate intensity defect at the apex that is fixed.  There is also a small, moderate intensity fixed defect in the inferior wall mainly from mid to basal level.  These regions are suggestive of scar although attenuation may also be prominent in the zones.  No large areas of ischemia noted.  Gated imaging reveals an EDV of 88, ESV of 39, T I D ratio of 1.0, and LVEF of 56% with inferior hypokinesis.  IMPRESSION: Abnormal, but relatively low risk Myoview.  There were equivocal ST- segment changes, no arrhythmias.  Perfusion imaging shows evidence of probable scar affecting the apex and inferior wall, overall no large residual ischemia.  T I D ratio is normal 1.0, and LVEF is 56% with inferior hypokinesis.   Original Report Authenticated By: Mayra Reel Knee Left Port  08/01/2012  *RADIOLOGY REPORT*  Clinical Data: Postop left knee arthroplasty.  PORTABLE LEFT KNEE - 1-2 VIEW   Comparison: None.  Findings: Changes of left knee replacement.  No hardware or bony complicating feature.  Soft tissue drain in place.  Soft tissue and joint space gas present.  IMPRESSION: Left knee replacement.  No complicating feature.   Original Report Authenticated By: Cyndie Chime, M.D.     EKG: Orders placed during the hospital encounter of 07/28/12  . EKG 12-LEAD  . EKG 12-LEAD     Hospital Course:  [name] [name] is a 76 y.o. who was admitted to [facility]. They were brought to the operating room on 08/01/2012 and underwent Procedure(s): TOTAL KNEE ARTHROPLASTY.  Patient tolerated the procedure well and was later transferred to the recovery room and then to the orthopaedic floor for postoperative care.  They were given PO and IV analgesics for pain control following their surgery.  They were given 24 hours of postoperative antibiotics of   and started on DVT prophylaxis in the form of Xarelto.   PT and OT were ordered for total joint protocol.  Discharge planning consulted to help with postop disposition and equipment needs.  Patient had a decent night on the evening of surgery and started to get up OOB with therapy on day one. Hemovac drain was pulled without difficulty.  Continued to work with therapy into day two.  Dressing was changed on day two and the incision was clean and dry.  By day three, the patient had not progressed with therapy and it became clear that he would need SNF placement.  Incision was healing well. Patient has been monitored for increased BUN and creatinine. He is currently on Xarelto for DV prophylaxis. His BUN is up from post op day four but creatinine down. With the creatinine improving hopefully the BUN will follow. We are going to have the SNF recheck the BUN Saturday. If BUN continues to increase we will D/C Xarelto and move patient to coumadin per pharmacy. Patient seen in rounds on post op day four and was ready to go to SNF.   Discharge  Medications: Prior to Admission medications   Medication Sig Start Date End Date Taking? Authorizing Provider  amLODipine (NORVASC) 10 MG tablet Take 10 mg by mouth at bedtime.  05/16/11  Yes Historical Provider, MD  atorvastatin (LIPITOR) 40 MG tablet Take 40 mg by mouth daily. Patient takes at bedtime 04/23/11  Yes Historical Provider, MD  AVODART 0.5 MG capsule Take 0.5 mg by mouth at bedtime.  05/06/11  Yes Historical Provider, MD  carvedilol (COREG) 12.5 MG tablet Take 12.5 mg by mouth 2 (two) times daily with a meal.   Yes Historical Provider, MD  DEXILANT 60 MG capsule Take 60 mg by mouth daily.  06/02/12  Yes Historical Provider, MD  hydrochlorothiazide (MICROZIDE) 12.5 MG capsule Take 12.5 mg by mouth every morning.    Yes Historical Provider, MD  lisinopril (PRINIVIL,ZESTRIL) 30 MG tablet Take 30 mg by mouth daily.  04/23/11  Yes Historical Provider, MD  methocarbamol (ROBAXIN) 500 MG tablet Take 1 tablet (500 mg total) by mouth every 6 (six) hours as needed. 08/04/12   Olie Dibert Tamala Ser, PA  oxyCODONE-acetaminophen (PERCOCET/ROXICET) 5-325 MG per tablet Take 1-2 tablets by mouth every 4 (four) hours as needed for pain. 08/04/12   Victoria Henshaw Tamala Ser, PA  rivaroxaban (XARELTO) 10 MG TABS tablet Take 1 tablet (10 mg total) by mouth daily with breakfast. 08/04/12   Oneida Arenas  Celedonio Savage, [place]    Diet: Cardiac diet Activity:WBAT Follow-up:in 2 weeks Disposition - Skilled nursing facility Discharged Condition: fair   Discharge Orders    Future Orders Please Complete By Expires   Diet - low sodium heart healthy      Call MD / Call 911      Comments:   If you experience chest pain or shortness of breath, CALL 911 and be transported to the hospital emergency room.  If you develope a fever above 101 F, pus (white drainage) or increased drainage or redness at the wound, or calf pain, call your surgeon's office.   Constipation Prevention      Comments:   Drink plenty of fluids.  Prune  juice may be helpful.  You may use a stool softener, such as Colace (over the counter) 100 mg twice a day.  Use MiraLax (over the counter) for constipation as needed.   Increase activity slowly as tolerated      Discharge instructions      Comments:   Walk with your walker. Weight bearing as instructed. Change your dressing daily. Shower only, no tub bath. Call if any temperatures greater than 101 or any wound complications: [phone] during the day and ask for Dr. Jeannetta Ellis nurse, [facility]. You may resume Plavix once 3 week course of Xarelto is completed   Driving restrictions      Comments:   No driving   Do not put a pillow under the knee. Place it under the heel.          Medication List     As of 08/05/2012  7:45 AM    STOP taking these medications         acetaminophen 500 MG tablet   Commonly known as: TYLENOL      clopidogrel 75 MG tablet   Commonly known as: PLAVIX      TAKE these medications         amLODipine 10 MG tablet   Commonly known as: NORVASC   Take 10 mg by mouth at bedtime.      atorvastatin 40 MG tablet   Commonly known as: LIPITOR   Take 40 mg by mouth daily. Patient takes at bedtime      AVODART 0.5 MG capsule   Generic drug: dutasteride   Take 0.5 mg by mouth at bedtime.      carvedilol 12.5 MG tablet   Commonly known as: COREG   Take 12.5 mg by mouth 2 (two) times daily with a meal.      DEXILANT 60 MG capsule   Generic drug: dexlansoprazole   Take 60 mg by mouth daily.      hydrochlorothiazide 12.5 MG capsule   Commonly known as: MICROZIDE   Take 12.5 mg by mouth every morning.      lisinopril 30 MG tablet   Commonly known as: PRINIVIL,ZESTRIL   Take 30 mg by mouth daily.      methocarbamol 500 MG tablet   Commonly known as: ROBAXIN   Take 1 tablet (500 mg total) by mouth every 6 (six) hours as needed.      oxyCODONE-acetaminophen 5-325 MG per tablet   Commonly known as: PERCOCET/ROXICET   Take 1-2 tablets by mouth every  4 (four) hours as needed for pain.      rivaroxaban 10 MG Tabs tablet   Commonly known as: XARELTO   Take 1 tablet (10 mg total) by mouth daily with breakfast.  Signed: Goro Wenrick LAUREN 08/05/2012, 7:45 AM

## 2012-08-05 NOTE — Progress Notes (Signed)
Discharge summary sent to payer through [employer]  

## 2012-08-05 NOTE — Progress Notes (Signed)
Clinical Social Work Department CLINICAL SOCIAL WORK PLACEMENT NOTE 08/05/2012  Patient:  [name] [name], [name] [name]  Account Number:  [id number] Admit date:  08/01/2012  Clinical Social Worker:  Robin Searing  Date/time:  08/03/2012 02:29 PM  Clinical Social Work is seeking post-discharge placement for this patient at the following level of care:   SKILLED NURSING   (*CSW will update this form in Epic as items are completed)   08/03/2012  Patient/family provided with [facility] Department of Clinical Social Work's list of facilities offering this level of care within the geographic area requested by the patient (or if unable, by the patient's family).  08/03/2012  Patient/family informed of their freedom to choose among providers that offer the needed level of care, that participate in Medicare, Medicaid or managed care program needed by the patient, have an available bed and are willing to accept the patient.  08/03/2012  Patient/family informed of [facility]' ownership interest in [facility], as well as of the fact that they are under no obligation to receive care at this facility.  PASARR submitted to EDS on 08/03/2012 PASARR number received from [employer] on   FL2 transmitted to all facilities in geographic area requested by pt/family on  08/03/2012 FL2 transmitted to all facilities within larger geographic area on   Patient informed that his/her managed care company has contracts with or will negotiate with  certain facilities, including the following:     Patient/family informed of bed offers received:  08/04/2012 Patient chooses bed at [facility] Physician recommends and patient chooses bed at    Patient to be transferred to [facility] on  08/05/2012 Patient to be transferred to facility by car  The following physician request were entered in Epic:   Additional Comments: Cori Razor LCSW [phone]

## 2012-08-06 ENCOUNTER — Emergency Department (HOSPITAL_COMMUNITY)
Admission: EM | Admit: 2012-08-06 | Discharge: 2012-08-06 | Disposition: A | Discharge status: Home / Self Care | Payer: Medicare Other | Attending: Emergency Medicine | Admitting: Emergency Medicine

## 2012-08-06 DIAGNOSIS — M79609 Pain in unspecified limb: Secondary | ICD-10-CM

## 2012-08-06 DIAGNOSIS — I2510 Atherosclerotic heart disease of native coronary artery without angina pectoris: Secondary | ICD-10-CM | POA: Insufficient documentation

## 2012-08-06 DIAGNOSIS — Z9889 Other specified postprocedural states: Secondary | ICD-10-CM | POA: Insufficient documentation

## 2012-08-06 DIAGNOSIS — Z87891 Personal history of nicotine dependence: Secondary | ICD-10-CM | POA: Insufficient documentation

## 2012-08-06 DIAGNOSIS — K5900 Constipation, unspecified: Secondary | ICD-10-CM

## 2012-08-06 DIAGNOSIS — E785 Hyperlipidemia, unspecified: Secondary | ICD-10-CM | POA: Insufficient documentation

## 2012-08-06 DIAGNOSIS — I252 Old myocardial infarction: Secondary | ICD-10-CM | POA: Insufficient documentation

## 2012-08-06 DIAGNOSIS — M129 Arthropathy, unspecified: Secondary | ICD-10-CM | POA: Insufficient documentation

## 2012-08-06 DIAGNOSIS — Z951 Presence of aortocoronary bypass graft: Secondary | ICD-10-CM | POA: Insufficient documentation

## 2012-08-06 DIAGNOSIS — K219 Gastro-esophageal reflux disease without esophagitis: Secondary | ICD-10-CM | POA: Insufficient documentation

## 2012-08-06 DIAGNOSIS — I10 Essential (primary) hypertension: Secondary | ICD-10-CM | POA: Insufficient documentation

## 2012-08-06 DIAGNOSIS — L989 Disorder of the skin and subcutaneous tissue, unspecified: Secondary | ICD-10-CM | POA: Insufficient documentation

## 2012-08-06 DIAGNOSIS — M7989 Other specified soft tissue disorders: Secondary | ICD-10-CM

## 2012-08-06 DIAGNOSIS — L0390 Cellulitis, unspecified: Secondary | ICD-10-CM

## 2012-08-06 DIAGNOSIS — N289 Disorder of kidney and ureter, unspecified: Secondary | ICD-10-CM | POA: Insufficient documentation

## 2012-08-06 LAB — CBC WITH DIFFERENTIAL/PLATELET
Basophils Absolute: 0.1 10*3/uL (ref 0.0–0.1)
Basophils Relative: 1 % (ref 0–1)
Eosinophils Absolute: 0.3 10*3/uL (ref 0.0–0.7)
Hemoglobin: 9.3 g/dL — ABNORMAL LOW (ref 13.0–17.0)
MCH: 27.8 pg (ref 26.0–34.0)
MCHC: 34.7 g/dL (ref 30.0–36.0)
Monocytes Relative: 18 % — ABNORMAL HIGH (ref 3–12)
Neutro Abs: 5.7 10*3/uL (ref 1.7–7.7)
Neutrophils Relative %: 69 % (ref 43–77)
RDW: 14.3 % (ref 11.5–15.5)

## 2012-08-06 LAB — URINALYSIS, ROUTINE W REFLEX MICROSCOPIC
Bilirubin Urine: NEGATIVE
Ketones, ur: NEGATIVE mg/dL
Nitrite: NEGATIVE
Specific Gravity, Urine: 1.017 (ref 1.005–1.030)
Urobilinogen, UA: 0.2 mg/dL (ref 0.0–1.0)

## 2012-08-06 LAB — URINE MICROSCOPIC-ADD ON

## 2012-08-06 LAB — BASIC METABOLIC PANEL
BUN: 32 mg/dL — ABNORMAL HIGH (ref 6–23)
Creatinine, Ser: 1.81 mg/dL — ABNORMAL HIGH (ref 0.50–1.35)
GFR calc Af Amer: 41 mL/min — ABNORMAL LOW (ref >90)
GFR calc non Af Amer: 35 mL/min — ABNORMAL LOW (ref >90)
Potassium: 4.5 mEq/L (ref 3.5–5.1)

## 2012-08-06 MED ORDER — TRAMADOL HCL 50 MG PO TABS
50.0000 mg | ORAL_TABLET | Freq: Once | ORAL | Status: AC
  Administered 2012-08-06: 50 mg via ORAL
  Filled 2012-08-06: qty 1

## 2012-08-06 MED ORDER — TRAMADOL HCL 50 MG PO TABS
50.0000 mg | ORAL_TABLET | Freq: Four times a day (QID) | ORAL | Status: DC | PRN
Start: 2012-08-06 — End: 2012-12-21

## 2012-08-06 MED ORDER — CEPHALEXIN 250 MG PO CAPS
500.0000 mg | ORAL_CAPSULE | Freq: Once | ORAL | Status: AC
  Administered 2012-08-06: 500 mg via ORAL
  Filled 2012-08-06: qty 2

## 2012-08-06 MED ORDER — MAGNESIUM CITRATE PO SOLN: 296.0000 mL | Freq: Once | ORAL | Status: DC

## 2012-08-06 MED ORDER — CEPHALEXIN 500 MG PO CAPS: 500.0000 mg | ORAL_CAPSULE | Freq: Four times a day (QID) | ORAL | Status: DC

## 2012-08-06 NOTE — ED Notes (Signed)
Patient waiting on ortho consult. Family at the bedside.

## 2012-08-06 NOTE — ED Notes (Signed)
Patient discharged with his daughter. instructions given using teach back. He is alert and oriented at this time.

## 2012-08-06 NOTE — ED Notes (Signed)
Patient brought in to the ED with a history of surgery on the left knee this past Monday. Patient advises that he has intermittent pain when he tries to ambulate.Although he denies pain at this time. He went to a rehab center "[facility]" His daughter advises that the patient fell while he was at the rehab center this morning. The left leg is hot to touch and red around the knee he also has a large discoloration noted to the back of the left leg purple in color and it is approximately 5 inches wide and 14 inches in length.

## 2012-08-06 NOTE — ED Provider Notes (Signed)
History     CSN: [id number]  Arrival date & time 08/06/12  1608   First MD Initiated Contact with Patient 08/06/12 1612      No chief complaint on file.   (Consider location/radiation/quality/duration/timing/severity/associated sxs/prior treatment) HPI Pt discharged to Rehab yesterday after L total knee replacement. He reports today while doing therapy he noticed increased redness and warmth to L knee moving up his thigh. Denies any fever, no vomiting. Pain in knee is unchanged, moderate, aching and worse with movement.    Past Medical History  Diagnosis Date  . Diverticulosis 2007    Colonoscopy  . Mixed hyperlipidemia   . Essential hypertension, benign   . Coronary atherosclerosis of native coronary artery     Multivessel and bypass graft disease  . Ischemic cardiomyopathy     LVEF 35%  . Fatty liver disease, nonalcoholic   . Chronic constipation   . Nephrolithiasis   . Hiatal hernia   . Schatzki's ring     Last EGD/ED Dr Eliezer Champagne  . GERD (gastroesophageal reflux disease)   . Renal insufficiency   . Myocardial infarction   . Headache     occasional headache   . Arthritis     Past Surgical History  Procedure Date  . Coronary artery bypass graft 2002    LIMA to LAD, SVG to diagonal and ramus, SVG to PDA and PLA  . Nephrectomy     left  . Kidney stone surgery     right  . Knee arthroscopy     left  . Cardiac catheterization   . Hemorrhoid surgery   . Total knee arthroplasty 08/01/2012    Procedure: TOTAL KNEE ARTHROPLASTY;  Surgeon: Jacki Cones, MD;  Location: [facility];  Service: Orthopedics;  Laterality: Left;    Family History  Problem Relation Age of Onset  . Coronary artery disease Father     History  Substance Use Topics  . Smoking status: Former Smoker -- 0.5 packs/day for 2 years    Types: Cigarettes    Quit date: 05/20/1961  . Smokeless tobacco: Current User    Types: Chew   Comment: quit 50 yrs ago  . Alcohol Use: No      Review of  Systems All other systems reviewed and are negative except as noted in HPI.   Allergies  Review of patient's allergies indicates no known allergies.  Home Medications   Current Outpatient Rx  Name Route Sig Dispense Refill  . AMLODIPINE BESYLATE 10 MG PO TABS Oral Take 10 mg by mouth at bedtime.     . ATORVASTATIN CALCIUM 40 MG PO TABS Oral Take 40 mg by mouth at bedtime.     . AVODART 0.5 MG PO CAPS Oral Take 0.5 mg by mouth at bedtime.     Marland Kitchen CARVEDILOL 12.5 MG PO TABS Oral Take 12.5 mg by mouth 2 (two) times daily with a meal.    . CLOPIDOGREL BISULFATE 75 MG PO TABS Oral Take 75 mg by mouth daily.    Marland Kitchen DEXILANT 60 MG PO CPDR Oral Take 60 mg by mouth daily.     Marland Kitchen HYDROCHLOROTHIAZIDE 12.5 MG PO CAPS Oral Take 12.5 mg by mouth every morning.     Marland Kitchen LISINOPRIL 30 MG PO TABS Oral Take 30 mg by mouth 2 (two) times daily.       BP 171/152  Pulse 71  Temp 98.3 F (36.8 C) (Oral)  Resp 16  SpO2 95%  Physical Exam  Nursing note  and vitals reviewed. Constitutional: He is oriented to person, place, and time. He appears well-developed and well-nourished.  HENT:  Head: Normocephalic and atraumatic.  Eyes: EOM are normal. Pupils are equal, round, and reactive to light.  Neck: Normal range of motion. Neck supple.  Cardiovascular: Normal rate, normal heart sounds and intact distal pulses.   Pulmonary/Chest: Effort normal and breath sounds normal.  Abdominal: Bowel sounds are normal. He exhibits no distension. There is no tenderness.  Musculoskeletal: He exhibits edema and tenderness.       L knee incision is intact with staples, no drainage, there is moderate warmth and erythema of the knee with some extension up the L medial thigh, tender to palpation  Neurological: He is alert and oriented to person, place, and time. He has normal strength. No cranial nerve deficit or sensory deficit.  Skin: Skin is warm and dry. No rash noted.  Psychiatric: He has a normal mood and affect.    ED  Course  Procedures (including critical care time)  Labs Reviewed  CBC WITH DIFFERENTIAL - Abnormal; Notable for the following:    RBC 3.35 (*)     Hemoglobin 9.3 (*)     HCT 26.8 (*)     Lymphocytes Relative 9 (*)     Monocytes Relative 18 (*)     Monocytes Absolute 1.5 (*)     All other components within normal limits  BASIC METABOLIC PANEL - Abnormal; Notable for the following:    Glucose, Bld 113 (*)     BUN 32 (*)     Creatinine, Ser 1.81 (*)     GFR calc non Af Amer 35 (*)     GFR calc Af Amer 41 (*)     All other components within normal limits  URINALYSIS, ROUTINE W REFLEX MICROSCOPIC - Abnormal; Notable for the following:    Color, Urine AMBER (*)  BIOCHEMICALS MAY BE AFFECTED BY COLOR   APPearance CLOUDY (*)     Hgb urine dipstick SMALL (*)     Leukocytes, UA LARGE (*)     All other components within normal limits  PROTIME-INR - Abnormal; Notable for the following:    Prothrombin Time 25.2 (*)     INR 2.42 (*)     All other components within normal limits  APTT - Abnormal; Notable for the following:    aPTT 49 (*)     All other components within normal limits  URINE MICROSCOPIC-ADD ON - Abnormal; Notable for the following:    Casts GRANULAR CAST (*)     All other components within normal limits   No results found.   No diagnosis found.    MDM  Doppler neg for DVT, pt is taking Xarelto (also has therapeutic INR, but not on coumadin). No fever or leukocytosis here. Discussed with Dr. Lestine Box on call for Dr. Darrelyn Hillock who states unlikely to be joint infection this soon after surgery. Skin changes may be due to expected settling and resolving of post-op hemarthrosis, but agrees with plan to start oral Abx and close follow up with his surgeon.   6:48 PM Discussed with family who are not comfortable with the patient returning to [facility]. They would rather care for him at home. He has already had home PT arranged so this should not be an issue. Daughter is  requesting Rx for Tramadol as it may be less constipating and sedating for the patient. He was also advised to try magnesium citrate for constipation at home.  Charles B. Bernette Mayers, MD 08/06/12 [phone]

## 2012-08-06 NOTE — ED Notes (Signed)
08/01/12: Lt. Knee surgery at [facility] - arhtroplasty; now: redness, warmth, redness radiating up from knee to thigh. Cms intact. Able to bear weight with assistance.

## 2012-08-23 ENCOUNTER — Ambulatory Visit: Payer: Medicare Other | Attending: Orthopedic Surgery | Admitting: Physical Therapy

## 2012-08-23 DIAGNOSIS — R5381 Other malaise: Secondary | ICD-10-CM | POA: Insufficient documentation

## 2012-08-23 DIAGNOSIS — R262 Difficulty in walking, not elsewhere classified: Secondary | ICD-10-CM | POA: Insufficient documentation

## 2012-08-23 DIAGNOSIS — M25569 Pain in unspecified knee: Secondary | ICD-10-CM | POA: Insufficient documentation

## 2012-08-23 DIAGNOSIS — IMO0001 Reserved for inherently not codable concepts without codable children: Secondary | ICD-10-CM | POA: Insufficient documentation

## 2012-08-23 DIAGNOSIS — Z96659 Presence of unspecified artificial knee joint: Secondary | ICD-10-CM | POA: Insufficient documentation

## 2012-08-23 DIAGNOSIS — M25669 Stiffness of unspecified knee, not elsewhere classified: Secondary | ICD-10-CM | POA: Insufficient documentation

## 2012-08-25 ENCOUNTER — Ambulatory Visit: Payer: Medicare Other | Admitting: Physical Therapy

## 2012-08-30 ENCOUNTER — Ambulatory Visit: Payer: Medicare Other | Admitting: Physical Therapy

## 2012-09-01 ENCOUNTER — Ambulatory Visit: Payer: Medicare Other | Admitting: Physical Therapy

## 2012-09-06 ENCOUNTER — Ambulatory Visit: Payer: Medicare Other | Admitting: Physical Therapy

## 2012-09-08 ENCOUNTER — Ambulatory Visit: Payer: Medicare Other | Admitting: Physical Therapy

## 2012-09-13 ENCOUNTER — Ambulatory Visit: Payer: Medicare Other | Attending: Orthopedic Surgery | Admitting: Physical Therapy

## 2012-09-13 DIAGNOSIS — IMO0001 Reserved for inherently not codable concepts without codable children: Secondary | ICD-10-CM | POA: Insufficient documentation

## 2012-09-13 DIAGNOSIS — Z96659 Presence of unspecified artificial knee joint: Secondary | ICD-10-CM | POA: Insufficient documentation

## 2012-09-13 DIAGNOSIS — M25669 Stiffness of unspecified knee, not elsewhere classified: Secondary | ICD-10-CM | POA: Insufficient documentation

## 2012-09-13 DIAGNOSIS — M25569 Pain in unspecified knee: Secondary | ICD-10-CM | POA: Insufficient documentation

## 2012-09-13 DIAGNOSIS — R5381 Other malaise: Secondary | ICD-10-CM | POA: Insufficient documentation

## 2012-09-13 DIAGNOSIS — R262 Difficulty in walking, not elsewhere classified: Secondary | ICD-10-CM | POA: Insufficient documentation

## 2012-09-15 ENCOUNTER — Ambulatory Visit: Payer: Medicare Other | Admitting: Physical Therapy

## 2012-09-20 ENCOUNTER — Ambulatory Visit (INDEPENDENT_AMBULATORY_CARE_PROVIDER_SITE_OTHER): Payer: Medicare Other | Admitting: Urology

## 2012-09-20 ENCOUNTER — Ambulatory Visit: Payer: Medicare Other | Admitting: Physical Therapy

## 2012-09-20 DIAGNOSIS — R972 Elevated prostate specific antigen [PSA]: Secondary | ICD-10-CM

## 2012-09-20 DIAGNOSIS — N400 Enlarged prostate without lower urinary tract symptoms: Secondary | ICD-10-CM

## 2012-09-22 ENCOUNTER — Ambulatory Visit: Payer: Medicare Other | Admitting: Physical Therapy

## 2012-09-27 ENCOUNTER — Ambulatory Visit: Payer: Medicare Other | Admitting: Physical Therapy

## 2012-09-29 ENCOUNTER — Ambulatory Visit: Payer: Medicare Other | Admitting: Physical Therapy

## 2012-10-03 ENCOUNTER — Ambulatory Visit: Payer: Medicare Other | Admitting: Physical Therapy

## 2012-10-05 ENCOUNTER — Ambulatory Visit: Payer: Medicare Other | Admitting: Physical Therapy

## 2012-10-11 ENCOUNTER — Ambulatory Visit: Payer: Medicare Other | Attending: Orthopedic Surgery | Admitting: Physical Therapy

## 2012-10-11 DIAGNOSIS — R262 Difficulty in walking, not elsewhere classified: Secondary | ICD-10-CM | POA: Insufficient documentation

## 2012-10-11 DIAGNOSIS — IMO0001 Reserved for inherently not codable concepts without codable children: Secondary | ICD-10-CM | POA: Insufficient documentation

## 2012-10-11 DIAGNOSIS — R5381 Other malaise: Secondary | ICD-10-CM | POA: Insufficient documentation

## 2012-10-11 DIAGNOSIS — Z96659 Presence of unspecified artificial knee joint: Secondary | ICD-10-CM | POA: Insufficient documentation

## 2012-10-11 DIAGNOSIS — M25669 Stiffness of unspecified knee, not elsewhere classified: Secondary | ICD-10-CM | POA: Insufficient documentation

## 2012-10-11 DIAGNOSIS — M25569 Pain in unspecified knee: Secondary | ICD-10-CM | POA: Insufficient documentation

## 2012-10-13 ENCOUNTER — Ambulatory Visit: Payer: Medicare Other | Admitting: Physical Therapy

## 2012-10-17 ENCOUNTER — Ambulatory Visit: Payer: Medicare Other | Admitting: Physical Therapy

## 2012-10-18 ENCOUNTER — Ambulatory Visit: Payer: Medicare Other | Admitting: Physical Therapy

## 2012-10-18 ENCOUNTER — Other Ambulatory Visit: Payer: Self-pay | Admitting: Cardiology

## 2012-10-18 MED ORDER — CLOPIDOGREL BISULFATE 75 MG PO TABS: 75.0000 mg | ORAL_TABLET | Freq: Every day | ORAL | Status: DC

## 2012-10-25 ENCOUNTER — Ambulatory Visit: Payer: Medicare Other | Admitting: Physical Therapy

## 2012-10-27 ENCOUNTER — Ambulatory Visit: Payer: Medicare Other | Admitting: Physical Therapy

## 2012-11-16 ENCOUNTER — Other Ambulatory Visit: Payer: Self-pay | Admitting: Cardiology

## 2012-11-16 MED ORDER — HYDROCHLOROTHIAZIDE 12.5 MG PO CAPS: 12.5000 mg | ORAL_CAPSULE | Freq: Every morning | ORAL | Status: DC

## 2012-12-21 ENCOUNTER — Ambulatory Visit (INDEPENDENT_AMBULATORY_CARE_PROVIDER_SITE_OTHER): Payer: Medicare Other | Admitting: Cardiology

## 2012-12-21 ENCOUNTER — Encounter: Payer: Self-pay | Admitting: Cardiology

## 2012-12-21 ENCOUNTER — Ambulatory Visit: Payer: Medicare Other | Admitting: Cardiology

## 2012-12-21 VITALS — BP 118/72 | HR 63 | Ht 64.0 in | Wt 198.8 lb

## 2012-12-21 DIAGNOSIS — I2510 Atherosclerotic heart disease of native coronary artery without angina pectoris: Secondary | ICD-10-CM

## 2012-12-21 DIAGNOSIS — I2589 Other forms of chronic ischemic heart disease: Secondary | ICD-10-CM

## 2012-12-21 DIAGNOSIS — I10 Essential (primary) hypertension: Secondary | ICD-10-CM

## 2012-12-21 DIAGNOSIS — E7800 Pure hypercholesterolemia, unspecified: Secondary | ICD-10-CM

## 2012-12-21 MED ORDER — NITROGLYCERIN 0.4 MG SL SUBL: 0.4000 mg | SUBLINGUAL_TABLET | SUBLINGUAL | Status: DC | PRN

## 2012-12-21 NOTE — Assessment & Plan Note (Signed)
Symptomatically stable on medical therapy. Myoview from last August actually looked reasonably good, overall low risk with no large ischemic territories, scar in the apex and inferior wall. LVEF also looked better. Continue medical therapy and observation. Refill for fresh nitroglycerin provided.

## 2012-12-21 NOTE — Assessment & Plan Note (Signed)
He continues on Lipitor, followed by Dr. Lysbeth Galas.

## 2012-12-21 NOTE — Assessment & Plan Note (Signed)
Blood pressure is normal today. 

## 2012-12-21 NOTE — Patient Instructions (Addendum)
Your physician recommends that you schedule a follow-up appointment in: 6 months  

## 2012-12-21 NOTE — Progress Notes (Signed)
Clinical Summary Mr. Tapp is a 77 y.o.male presenting for followup. He was seen in August 2013.  Followup Lexiscan Myoview done in August of last year was overall low risk, equivocal ST segment changes, scar involving the apex and inferior wall without any large residual ischemia, LVEF 56% with inferior hypokinesis. He did ultimately undergo a left total knee arthroplasty in September of last year. No obvious cardiac complications. He states that he has completed rehabilitation and is ambulating reasonably well.  Recent lab work from January showed potassium 4.4, BUN 13, creatinine1.5, AST 13, ALT less than 8, TSH 1.4. ECG today shows sinus rhythm with left bundle branch block.  He reports no angina symptoms. Does not have any fresh nitroglycerin. Otherwise compliant with medications.  No Known Allergies  Current Outpatient Prescriptions  Medication Sig Dispense Refill  . amLODipine (NORVASC) 10 MG tablet Take 10 mg by mouth at bedtime.       Marland Kitchen atorvastatin (LIPITOR) 40 MG tablet Take 40 mg by mouth at bedtime.       . AVODART 0.5 MG capsule Take 0.5 mg by mouth at bedtime.       . carvedilol (COREG) 12.5 MG tablet Take 12.5 mg by mouth 2 (two) times daily with a meal.      . clopidogrel (PLAVIX) 75 MG tablet Take 1 tablet (75 mg total) by mouth daily.  30 tablet  3  . DEXILANT 60 MG capsule Take 60 mg by mouth daily.       . ergocalciferol (VITAMIN D2) 50000 UNITS capsule Take 50,000 Units by mouth once a week.      . hydrochlorothiazide (MICROZIDE) 12.5 MG capsule Take 1 capsule (12.5 mg total) by mouth every morning.  30 capsule  1  . lisinopril (PRINIVIL,ZESTRIL) 30 MG tablet Take 30 mg by mouth 2 (two) times daily.       . nitroGLYCERIN (NITROSTAT) 0.4 MG SL tablet Place 1 tablet (0.4 mg total) under the tongue every 5 (five) minutes as needed for chest pain.  25 tablet  3   No current facility-administered medications for this visit.    Past Medical History  Diagnosis Date  .  Diverticulosis 2007    Colonoscopy  . Mixed hyperlipidemia   . Essential hypertension, benign   . Coronary atherosclerosis of native coronary artery     Multivessel and bypass graft disease  . Ischemic cardiomyopathy     LVEF 35%  . Fatty liver disease, nonalcoholic   . Chronic constipation   . Nephrolithiasis   . Hiatal hernia   . Schatzki's ring     Last EGD/ED Dr Eliezer Champagne  . GERD (gastroesophageal reflux disease)   . Renal insufficiency   . Myocardial infarction   . Headache   . Arthritis     Social History Mr. Lucks reports that he quit smoking about 51 years ago. His smoking use included Cigarettes. He has a 1 pack-year smoking history. His smokeless tobacco use includes Chew. Mr. Delancey reports that he does not drink alcohol.  Review of Systems No palpitations or syncope. No orthopnea PND. Still some swelling of his left knee. Otherwise negative.  Physical Examination Filed Vitals:   12/21/12 1236  BP: 118/72  Pulse: 63   Filed Weights   12/21/12 1236  Weight: 198 lb 12 oz (90.152 kg)   Overweight male in no acute distress.  HEENT: Conjunctiva and lids normal, oropharynx with poor dentition.  Neck: Supple, no elevated JVP or bruits.  Lungs: Diminished, nonlabored.  Cardiac: Regular rate and rhythm, indistinct PMI, no S3.  Abdomen: Soft, nontender, bowel sounds present.  Skin: Warm and dry.  Extremities: 1+ edema below the knees, symmetrical. Distal pulses one plus.  Musculoskeletal: No gross deformities.  Neuropsychiatric: Alert and oriented x3, affect appropriate.   Problem List and Plan   CORONARY ATHEROSCLEROSIS NATIVE CORONARY ARTERY Symptomatically stable on medical therapy. Myoview from last August actually looked reasonably good, overall low risk with no large ischemic territories, scar in the apex and inferior wall. LVEF also looked better. Continue medical therapy and observation. Refill for fresh nitroglycerin provided.  CARDIOMYOPATHY,  ISCHEMIC LVEF by Myoview in August of last year was 56%.  ESSENTIAL HYPERTENSION, BENIGN Blood pressure is normal today.  HYPERCHOLESTEROLEMIA He continues on Lipitor, followed by Dr. Lysbeth Galas.    Jonelle Sidle, M.D., F.A.C.C.

## 2012-12-21 NOTE — Assessment & Plan Note (Signed)
LVEF by Myoview in August of last year was 56%.

## 2013-01-11 ENCOUNTER — Other Ambulatory Visit: Payer: Self-pay | Admitting: Cardiology

## 2013-01-11 MED ORDER — HYDROCHLOROTHIAZIDE 12.5 MG PO CAPS: 12.5000 mg | ORAL_CAPSULE | Freq: Every morning | ORAL | Status: DC

## 2013-01-15 ENCOUNTER — Other Ambulatory Visit: Payer: Self-pay | Admitting: Cardiology

## 2013-01-24 ENCOUNTER — Other Ambulatory Visit: Payer: Self-pay | Admitting: Cardiology

## 2013-06-19 ENCOUNTER — Encounter: Payer: Self-pay | Admitting: Cardiology

## 2013-07-06 ENCOUNTER — Ambulatory Visit: Payer: Medicare Other | Admitting: Cardiology

## 2013-07-13 ENCOUNTER — Other Ambulatory Visit: Payer: Self-pay | Admitting: Cardiology

## 2013-07-17 ENCOUNTER — Encounter: Payer: Self-pay | Admitting: Cardiology

## 2013-07-17 ENCOUNTER — Ambulatory Visit (INDEPENDENT_AMBULATORY_CARE_PROVIDER_SITE_OTHER): Payer: Medicare Other | Admitting: Cardiology

## 2013-07-17 VITALS — BP 132/75 | HR 55 | Ht 65.5 in | Wt 199.8 lb

## 2013-07-17 DIAGNOSIS — E7800 Pure hypercholesterolemia, unspecified: Secondary | ICD-10-CM

## 2013-07-17 DIAGNOSIS — I2510 Atherosclerotic heart disease of native coronary artery without angina pectoris: Secondary | ICD-10-CM

## 2013-07-17 DIAGNOSIS — I2589 Other forms of chronic ischemic heart disease: Secondary | ICD-10-CM

## 2013-07-17 DIAGNOSIS — I10 Essential (primary) hypertension: Secondary | ICD-10-CM

## 2013-07-17 MED ORDER — CLOPIDOGREL BISULFATE 75 MG PO TABS: 75.0000 mg | ORAL_TABLET | Freq: Every day | ORAL | Status: DC

## 2013-07-17 MED ORDER — HYDROCHLOROTHIAZIDE 12.5 MG PO CAPS: 12.5000 mg | ORAL_CAPSULE | Freq: Every morning | ORAL | Status: DC

## 2013-07-17 MED ORDER — CARVEDILOL 12.5 MG PO TABS: 12.5000 mg | ORAL_TABLET | Freq: Two times a day (BID) | ORAL | Status: DC

## 2013-07-17 NOTE — Progress Notes (Signed)
Clinical Summary [name] [name] is a 77 y.o.male last seen in February. He reports occasional angina symptoms, no increasing frequency. Reports compliance with his medications.  Followup Lexiscan Myoview done in August of last year was overall low risk, equivocal ST segment changes, scar involving the apex and inferior wall without any large residual ischemia, LVEF 56% with inferior hypokinesis. ECG today shows sinus bradycardia with IVCD and possible old anterior infarct.  Recent lab work from July showed potassium 4.1, BUN 19, creatinine 1.7, AST 13, ALT 11, hemoglobin 14.0, platelets 170, cholesterol 122, triglycerides 158, HDL 36, LDL 52.   No Known Allergies  Current Outpatient Prescriptions  Medication Sig Dispense Refill  . amLODipine (NORVASC) 10 MG tablet Take 10 mg by mouth at bedtime.       Marland Kitchen atorvastatin (LIPITOR) 40 MG tablet Take 40 mg by mouth at bedtime.       . AVODART 0.5 MG capsule Take 0.5 mg by mouth at bedtime.       . carvedilol (COREG) 12.5 MG tablet Take 1 tablet (12.5 mg total) by mouth 2 (two) times daily with a meal.  60 tablet  5  . clopidogrel (PLAVIX) 75 MG tablet Take 1 tablet (75 mg total) by mouth daily.  30 tablet  6  . DEXILANT 60 MG capsule Take 60 mg by mouth daily.       . ergocalciferol (VITAMIN D2) 50000 UNITS capsule Take 50,000 Units by mouth once a week.      . hydrochlorothiazide (MICROZIDE) 12.5 MG capsule Take 1 capsule (12.5 mg total) by mouth every morning.  30 capsule  6  . lisinopril (PRINIVIL,ZESTRIL) 30 MG tablet Take 30 mg by mouth 2 (two) times daily.       . nitroGLYCERIN (NITROSTAT) 0.4 MG SL tablet Place 1 tablet (0.4 mg total) under the tongue every 5 (five) minutes as needed for chest pain.  25 tablet  3   No current facility-administered medications for this visit.    Past Medical History  Diagnosis Date  . Diverticulosis 2007    Colonoscopy  . Mixed hyperlipidemia   . Essential hypertension, benign   . Coronary  atherosclerosis of native coronary artery     Multivessel and bypass graft disease  . Ischemic cardiomyopathy     LVEF 35%  . Fatty liver disease, nonalcoholic   . Chronic constipation   . Nephrolithiasis   . Hiatal hernia   . Schatzki's ring     Last EGD/ED Dr Eliezer Champagne  . GERD (gastroesophageal reflux disease)   . Renal insufficiency   . Myocardial infarction   . Headache(784.0)   . Arthritis     Past Surgical History  Procedure Laterality Date  . Coronary artery bypass graft  2002    LIMA to LAD, SVG to diagonal and ramus, SVG to PDA and PLA  . Nephrectomy      left  . Kidney stone surgery      right  . Knee arthroscopy      left  . Cardiac catheterization    . Hemorrhoid surgery    . Total knee arthroplasty  08/01/2012    Procedure: TOTAL KNEE ARTHROPLASTY;  Surgeon: Jacki Cones, MD;  Location: [facility];  Service: Orthopedics;  Laterality: Left;    Social History [name] reports that he quit smoking about 52 years ago. His smoking use included Cigarettes. He has a 1 pack-year smoking history. His smokeless tobacco use includes Chew. [name] reports that he does  not drink alcohol.  Review of Systems No palpitations or syncope. No orthopnea. Mild ankle edema. Otherwise negative.  Physical Examination Filed Vitals:   07/17/13 1451  BP: 132/75  Pulse: 55   Filed Weights   07/17/13 1451  Weight: 199 lb 12 oz (90.606 kg)    Overweight male in no acute distress.  HEENT: Conjunctiva and lids normal, oropharynx with poor dentition.  Neck: Supple, no elevated JVP or bruits.  Lungs: Diminished, nonlabored.  Cardiac: Regular rate and rhythm, indistinct PMI, no S3.  Abdomen: Soft, nontender, bowel sounds present.  Skin: Warm and dry.  Extremities: 1+ edema below the knees, symmetrical. Distal pulses one plus.  Musculoskeletal: No gross deformities.  Neuropsychiatric: Alert and oriented x3, affect appropriate.   Problem List and Plan   CORONARY  ATHEROSCLEROSIS NATIVE CORONARY ARTERY Stable angina symptoms. Most recent ischemic assessment showed inferior and apical scar with no large ischemic defects, LVEF 58%. Continue medical therapy and observation.  Essential hypertension, benign Continue current regimen.  HYPERCHOLESTEROLEMIA Lipids have been well controlled on statin therapy.    Jonelle Sidle, M.D., F.A.C.C.

## 2013-07-17 NOTE — Assessment & Plan Note (Signed)
Lipids have been well controlled on statin therapy.

## 2013-07-17 NOTE — Assessment & Plan Note (Signed)
Stable angina symptoms. Most recent ischemic assessment showed inferior and apical scar with no large ischemic defects, LVEF 58%. Continue medical therapy and observation.

## 2013-07-17 NOTE — Assessment & Plan Note (Signed)
Continue current regimen

## 2013-07-17 NOTE — Patient Instructions (Addendum)
Your physician recommends that you schedule a follow-up appointment in: 6 MONTH   

## 2013-07-18 ENCOUNTER — Other Ambulatory Visit: Payer: Self-pay | Admitting: Cardiology

## 2013-09-19 ENCOUNTER — Encounter (INDEPENDENT_AMBULATORY_CARE_PROVIDER_SITE_OTHER): Payer: Self-pay

## 2013-09-19 ENCOUNTER — Ambulatory Visit (INDEPENDENT_AMBULATORY_CARE_PROVIDER_SITE_OTHER): Payer: Medicare Other | Admitting: Urology

## 2013-09-19 DIAGNOSIS — N400 Enlarged prostate without lower urinary tract symptoms: Secondary | ICD-10-CM

## 2013-09-19 DIAGNOSIS — R972 Elevated prostate specific antigen [PSA]: Secondary | ICD-10-CM

## 2014-01-08 ENCOUNTER — Other Ambulatory Visit: Payer: Self-pay | Admitting: Cardiology

## 2014-01-08 MED ORDER — CARVEDILOL 12.5 MG PO TABS: ORAL_TABLET | ORAL | Status: DC

## 2014-01-09 ENCOUNTER — Other Ambulatory Visit: Payer: Self-pay | Admitting: *Deleted

## 2014-01-09 MED ORDER — CARVEDILOL 12.5 MG PO TABS: 12.5000 mg | ORAL_TABLET | Freq: Two times a day (BID) | ORAL | Status: DC

## 2014-01-17 ENCOUNTER — Ambulatory Visit (INDEPENDENT_AMBULATORY_CARE_PROVIDER_SITE_OTHER): Payer: Medicare Other | Admitting: Cardiology

## 2014-01-17 ENCOUNTER — Encounter: Payer: Self-pay | Admitting: Cardiology

## 2014-01-17 VITALS — BP 115/71 | HR 57 | Ht 64.0 in | Wt 198.0 lb

## 2014-01-17 DIAGNOSIS — I10 Essential (primary) hypertension: Secondary | ICD-10-CM

## 2014-01-17 DIAGNOSIS — I2510 Atherosclerotic heart disease of native coronary artery without angina pectoris: Secondary | ICD-10-CM

## 2014-01-17 DIAGNOSIS — E7800 Pure hypercholesterolemia, unspecified: Secondary | ICD-10-CM

## 2014-01-17 NOTE — Assessment & Plan Note (Signed)
Symptomatically stable on medical therapy. Ischemic testing within the last 2 years reviewed above. Continue observation.

## 2014-01-17 NOTE — Assessment & Plan Note (Signed)
Blood pressure normal today. No changes made.

## 2014-01-17 NOTE — Patient Instructions (Signed)
Your physician recommends that you schedule a follow-up appointment in: 6 months with Dr McDowell You will receive a reminder letter two months in advance reminding you to call and schedule your appointment. If you don't receive this letter, please contact our office.  Your physician recommends that you continue on your current medications as directed. Please refer to the Current Medication list given to you today.   

## 2014-01-17 NOTE — Assessment & Plan Note (Signed)
Due for followup lab work with primary care provider in a month. He continues on Lipitor.

## 2014-01-17 NOTE — Progress Notes (Signed)
Clinical Summary Mr. Arora is a 78 y.o.male last seen in this September 2014. He has been doing well from a cardiac perspective, reports no accelerating angina or shortness of breath. He reports compliance with his medications.  He will be following up for a physical with lab work at his primary care provider in a month.  Lexiscan Myoview done in August 2013 was overall low risk, equivocal ST segment changes, scar involving the apex and inferior wall without any large residual ischemia, LVEF 56% with inferior hypokinesis.   No Known Allergies  Current Outpatient Prescriptions  Medication Sig Dispense Refill  . amLODipine (NORVASC) 10 MG tablet Take 10 mg by mouth at bedtime.       Marland Kitchen atorvastatin (LIPITOR) 40 MG tablet Take 40 mg by mouth at bedtime.       . carvedilol (COREG) 12.5 MG tablet Take 1 tablet (12.5 mg total) by mouth 2 (two) times daily with a meal.  60 tablet  6  . clopidogrel (PLAVIX) 75 MG tablet Take 1 tablet (75 mg total) by mouth daily.  30 tablet  6  . DEXILANT 60 MG capsule Take 60 mg by mouth daily.       . ergocalciferol (VITAMIN D2) 50000 UNITS capsule Take 50,000 Units by mouth once a week.      . finasteride (PROSCAR) 5 MG tablet Take 5 mg by mouth daily.      . hydrochlorothiazide (MICROZIDE) 12.5 MG capsule Take 1 capsule (12.5 mg total) by mouth every morning.  30 capsule  6  . lisinopril (PRINIVIL,ZESTRIL) 30 MG tablet Take 30 mg by mouth daily.       . nitroGLYCERIN (NITROSTAT) 0.4 MG SL tablet Place 1 tablet (0.4 mg total) under the tongue every 5 (five) minutes as needed for chest pain.  25 tablet  3   No current facility-administered medications for this visit.    Past Medical History  Diagnosis Date  . Diverticulosis 2007    Colonoscopy  . Mixed hyperlipidemia   . Essential hypertension, benign   . Coronary atherosclerosis of native coronary artery     Multivessel and bypass graft disease  . Ischemic cardiomyopathy     LVEF 35%  . Fatty  liver disease, nonalcoholic   . Chronic constipation   . Nephrolithiasis   . Hiatal hernia   . Schatzki's ring     Last EGD/ED Dr Paul Half  . GERD (gastroesophageal reflux disease)   . Renal insufficiency   . Myocardial infarction   . Headache(784.0)   . Arthritis     Past Surgical History  Procedure Laterality Date  . Coronary artery bypass graft  2002    LIMA to LAD, SVG to diagonal and ramus, SVG to PDA and PLA  . Nephrectomy      left  . Kidney stone surgery      right  . Knee arthroscopy      left  . Cardiac catheterization    . Hemorrhoid surgery    . Total knee arthroplasty  08/01/2012    Procedure: TOTAL KNEE ARTHROPLASTY;  Surgeon: Tobi Bastos, MD;  Location: [facility];  Service: Orthopedics;  Laterality: Left;    Social History Mr. Meador reports that he quit smoking about 52 years ago. His smoking use included Cigarettes. He has a 1 pack-year smoking history. His smokeless tobacco use includes Chew. Mr. Gossard reports that he does not drink alcohol.  Review of Systems No palpitations, dizziness, syncope. Arthritic pains. No orthopnea  or PND. Otherwise negative.  Physical Examination Filed Vitals:   01/17/14 1054  BP: 115/71  Pulse: 57   Filed Weights   01/17/14 1054  Weight: 198 lb (89.812 kg)    Overweight male in no acute distress.  HEENT: Conjunctiva and lids normal, oropharynx with poor dentition.  Neck: Supple, no elevated JVP or bruits.  Lungs: Diminished, nonlabored.  Cardiac: Regular rate and rhythm, indistinct PMI, no S3.  Abdomen: Soft, nontender, bowel sounds present.  Skin: Warm and dry.  Extremities: 1+ edema below the knees, symmetrical. Distal pulses one plus.  Musculoskeletal: No gross deformities.  Neuropsychiatric: Alert and oriented x3, affect appropriate.   Problem List and Plan   CORONARY ATHEROSCLEROSIS NATIVE CORONARY ARTERY Symptomatically stable on medical therapy. Ischemic testing within the last 2 years reviewed  above. Continue observation.  Essential hypertension, benign Blood pressure normal today. No changes made.  HYPERCHOLESTEROLEMIA Due for followup lab work with primary care provider in a month. He continues on Lipitor.    Satira Sark, M.D., F.A.C.C.

## 2014-03-05 DIAGNOSIS — E559 Vitamin D deficiency, unspecified: Secondary | ICD-10-CM | POA: Insufficient documentation

## 2014-03-29 ENCOUNTER — Telehealth: Payer: Self-pay | Admitting: Cardiology

## 2014-03-29 MED ORDER — HYDROCHLOROTHIAZIDE 12.5 MG PO CAPS: 12.5000 mg | ORAL_CAPSULE | Freq: Every morning | ORAL | Status: DC

## 2014-03-29 NOTE — Telephone Encounter (Signed)
Received fax refill request  Rx # [id number] Medication:  Hydrochlorothiazide 12.5 mg cap Qty 30 Sig:  Take one capsule by mouth every morning  Physician:  Domenic Polite

## 2014-04-03 ENCOUNTER — Other Ambulatory Visit: Payer: Self-pay | Admitting: *Deleted

## 2014-04-03 MED ORDER — CLOPIDOGREL BISULFATE 75 MG PO TABS: 75.0000 mg | ORAL_TABLET | Freq: Every day | ORAL | Status: DC

## 2014-05-24 NOTE — Telephone Encounter (Signed)
Close Encounter 

## 2014-07-06 ENCOUNTER — Emergency Department (HOSPITAL_COMMUNITY)
Admission: EM | Admit: 2014-07-06 | Discharge: 2014-07-06 | Disposition: A | Discharge status: Home / Self Care | Payer: Medicare Other | Attending: Emergency Medicine | Admitting: Emergency Medicine

## 2014-07-06 ENCOUNTER — Emergency Department (HOSPITAL_COMMUNITY): Payer: Medicare Other

## 2014-07-06 ENCOUNTER — Encounter (HOSPITAL_COMMUNITY): Payer: Self-pay | Admitting: Emergency Medicine

## 2014-07-06 DIAGNOSIS — H5310 Unspecified subjective visual disturbances: Secondary | ICD-10-CM

## 2014-07-06 DIAGNOSIS — Z951 Presence of aortocoronary bypass graft: Secondary | ICD-10-CM | POA: Diagnosis not present

## 2014-07-06 DIAGNOSIS — Z87442 Personal history of urinary calculi: Secondary | ICD-10-CM | POA: Insufficient documentation

## 2014-07-06 DIAGNOSIS — Z87448 Personal history of other diseases of urinary system: Secondary | ICD-10-CM | POA: Diagnosis not present

## 2014-07-06 DIAGNOSIS — K219 Gastro-esophageal reflux disease without esophagitis: Secondary | ICD-10-CM | POA: Diagnosis not present

## 2014-07-06 DIAGNOSIS — Z8775 Personal history of (corrected) congenital malformations of respiratory system: Secondary | ICD-10-CM | POA: Diagnosis not present

## 2014-07-06 DIAGNOSIS — I2510 Atherosclerotic heart disease of native coronary artery without angina pectoris: Secondary | ICD-10-CM | POA: Diagnosis not present

## 2014-07-06 DIAGNOSIS — H539 Unspecified visual disturbance: Secondary | ICD-10-CM | POA: Insufficient documentation

## 2014-07-06 DIAGNOSIS — H3560 Retinal hemorrhage, unspecified eye: Secondary | ICD-10-CM | POA: Diagnosis not present

## 2014-07-06 DIAGNOSIS — Z9889 Other specified postprocedural states: Secondary | ICD-10-CM | POA: Diagnosis not present

## 2014-07-06 DIAGNOSIS — I10 Essential (primary) hypertension: Secondary | ICD-10-CM | POA: Diagnosis not present

## 2014-07-06 DIAGNOSIS — H3562 Retinal hemorrhage, left eye: Secondary | ICD-10-CM

## 2014-07-06 DIAGNOSIS — Z7902 Long term (current) use of antithrombotics/antiplatelets: Secondary | ICD-10-CM | POA: Insufficient documentation

## 2014-07-06 DIAGNOSIS — I252 Old myocardial infarction: Secondary | ICD-10-CM | POA: Insufficient documentation

## 2014-07-06 DIAGNOSIS — Z87891 Personal history of nicotine dependence: Secondary | ICD-10-CM | POA: Insufficient documentation

## 2014-07-06 DIAGNOSIS — E782 Mixed hyperlipidemia: Secondary | ICD-10-CM | POA: Diagnosis not present

## 2014-07-06 DIAGNOSIS — R51 Headache: Secondary | ICD-10-CM | POA: Insufficient documentation

## 2014-07-06 DIAGNOSIS — Z79899 Other long term (current) drug therapy: Secondary | ICD-10-CM | POA: Insufficient documentation

## 2014-07-06 DIAGNOSIS — Z8739 Personal history of other diseases of the musculoskeletal system and connective tissue: Secondary | ICD-10-CM | POA: Insufficient documentation

## 2014-07-06 LAB — ETHANOL: Alcohol, Ethyl (B): <11 mg/dL (ref 0–11)

## 2014-07-06 LAB — I-STAT TROPONIN, ED: Troponin i, poc: 0 ng/mL (ref 0.00–0.08)

## 2014-07-06 LAB — URINALYSIS, ROUTINE W REFLEX MICROSCOPIC
Bilirubin Urine: NEGATIVE
GLUCOSE, UA: NEGATIVE mg/dL
HGB URINE DIPSTICK: NEGATIVE
Ketones, ur: NEGATIVE mg/dL
Nitrite: NEGATIVE
PH: 5.5 (ref 5.0–8.0)
Protein, ur: NEGATIVE mg/dL
SPECIFIC GRAVITY, URINE: 1.014 (ref 1.005–1.030)
Urobilinogen, UA: 1 mg/dL (ref 0.0–1.0)

## 2014-07-06 LAB — DIFFERENTIAL
Basophils Absolute: 0.1 10*3/uL (ref 0.0–0.1)
Basophils Relative: 1 % (ref 0–1)
Eosinophils Absolute: 0.2 10*3/uL (ref 0.0–0.7)
Eosinophils Relative: 4 % (ref 0–5)
LYMPHS ABS: 1.3 10*3/uL (ref 0.7–4.0)
Lymphocytes Relative: 22 % (ref 12–46)
MONOS PCT: 11 % (ref 3–12)
Monocytes Absolute: 0.6 10*3/uL (ref 0.1–1.0)
NEUTROS ABS: 3.5 10*3/uL (ref 1.7–7.7)
Neutrophils Relative %: 62 % (ref 43–77)

## 2014-07-06 LAB — URINE MICROSCOPIC-ADD ON

## 2014-07-06 LAB — RAPID URINE DRUG SCREEN, HOSP PERFORMED
Amphetamines: NOT DETECTED
Barbiturates: NOT DETECTED
Benzodiazepines: NOT DETECTED
Cocaine: NOT DETECTED
OPIATES: NOT DETECTED
Tetrahydrocannabinol: NOT DETECTED

## 2014-07-06 LAB — CBC
HEMATOCRIT: 41.5 % (ref 39.0–52.0)
HEMOGLOBIN: 14.4 g/dL (ref 13.0–17.0)
MCH: 27.8 pg (ref 26.0–34.0)
MCHC: 34.7 g/dL (ref 30.0–36.0)
MCV: 80.1 fL (ref 78.0–100.0)
Platelets: 171 10*3/uL (ref 150–400)
RBC: 5.18 MIL/uL (ref 4.22–5.81)
RDW: 13.7 % (ref 11.5–15.5)
WBC: 5.6 10*3/uL (ref 4.0–10.5)

## 2014-07-06 LAB — I-STAT CHEM 8, ED
BUN: 21 mg/dL (ref 6–23)
CALCIUM ION: 1.21 mmol/L (ref 1.13–1.30)
Chloride: 102 mEq/L (ref 96–112)
Creatinine, Ser: 1.8 mg/dL — ABNORMAL HIGH (ref 0.50–1.35)
Glucose, Bld: 90 mg/dL (ref 70–99)
HEMATOCRIT: 44 % (ref 39.0–52.0)
Hemoglobin: 15 g/dL (ref 13.0–17.0)
Potassium: 4.7 mEq/L (ref 3.7–5.3)
Sodium: 136 mEq/L — ABNORMAL LOW (ref 137–147)
TCO2: 27 mmol/L (ref 0–100)

## 2014-07-06 LAB — COMPREHENSIVE METABOLIC PANEL
ALK PHOS: 78 U/L (ref 39–117)
ALT: 12 U/L (ref 0–53)
ANION GAP: 10 (ref 5–15)
AST: 16 U/L (ref 0–37)
Albumin: 3.4 g/dL — ABNORMAL LOW (ref 3.5–5.2)
BILIRUBIN TOTAL: 0.6 mg/dL (ref 0.3–1.2)
BUN: 18 mg/dL (ref 6–23)
CHLORIDE: 101 meq/L (ref 96–112)
CO2: 26 mEq/L (ref 19–32)
CREATININE: 1.76 mg/dL — AB (ref 0.50–1.35)
Calcium: 9.2 mg/dL (ref 8.4–10.5)
GFR, EST AFRICAN AMERICAN: 42 mL/min — AB (ref >90)
GFR, EST NON AFRICAN AMERICAN: 36 mL/min — AB (ref >90)
GLUCOSE: 90 mg/dL (ref 70–99)
Potassium: 4.9 mEq/L (ref 3.7–5.3)
Sodium: 137 mEq/L (ref 137–147)
Total Protein: 6.8 g/dL (ref 6.0–8.3)

## 2014-07-06 LAB — PROTIME-INR
INR: 1.1 (ref 0.00–1.49)
Prothrombin Time: 14.2 seconds (ref 11.6–15.2)

## 2014-07-06 LAB — APTT: APTT: 29 s (ref 24–37)

## 2014-07-06 MED ORDER — GADOBENATE DIMEGLUMINE 529 MG/ML IV SOLN
10.0000 mL | Freq: Once | INTRAVENOUS | Status: AC | PRN
Start: 2014-07-06 — End: 2014-07-06
  Administered 2014-07-06: 10 mL via INTRAVENOUS

## 2014-07-06 NOTE — ED Notes (Signed)
Pt sent here by his eye doctor with severe intraarterial hemorrhages in the left eye. Pt has no complaints.

## 2014-07-06 NOTE — ED Provider Notes (Signed)
CSN: [id number]     Arrival date & time 07/06/14  1207 History   First MD Initiated Contact with Patient 07/06/14 1330     Chief Complaint  Patient presents with  . Eye Problem     (Consider location/radiation/quality/duration/timing/severity/associated sxs/prior Treatment) HPI Comments: Patient presents from optometrist with gradually decreasing vision in his left eye for the past several days. Denies any eye pain. Denies any focal weakness, numbness or tingling. Denies any chest pain or shortness of breath. Denies any difficulty swallowing or speaking. His optometrist was concerned for carotid occlusion and referred him to the ED.  The history is provided by the patient and a relative.    Past Medical History  Diagnosis Date  . Diverticulosis 2007    Colonoscopy  . Mixed hyperlipidemia   . Essential hypertension, benign   . Coronary atherosclerosis of native coronary artery     Multivessel and bypass graft disease  . Ischemic cardiomyopathy     LVEF 35%  . Fatty liver disease, nonalcoholic   . Chronic constipation   . Nephrolithiasis   . Hiatal hernia   . Schatzki's ring     Last EGD/ED Dr Paul Half  . GERD (gastroesophageal reflux disease)   . Renal insufficiency   . Myocardial infarction   . Headache(784.0)   . Arthritis    Past Surgical History  Procedure Laterality Date  . Coronary artery bypass graft  2002    LIMA to LAD, SVG to diagonal and ramus, SVG to PDA and PLA  . Nephrectomy      left  . Kidney stone surgery      right  . Knee arthroscopy      left  . Cardiac catheterization    . Hemorrhoid surgery    . Total knee arthroplasty  08/01/2012    Procedure: TOTAL KNEE ARTHROPLASTY;  Surgeon: Tobi Bastos, MD;  Location: [facility];  Service: Orthopedics;  Laterality: Left;   Family History  Problem Relation Age of Onset  . Coronary artery disease Father    History  Substance Use Topics  . Smoking status: Former Smoker -- 0.50 packs/day for 2 years     Types: Cigarettes    Quit date: 05/20/1961  . Smokeless tobacco: Current User    Types: Chew     Comment: quit 50 yrs ago  . Alcohol Use: No    Review of Systems  Constitutional: Negative for activity change and appetite change.  HENT: Negative for congestion and rhinorrhea.   Eyes: Positive for visual disturbance.  Respiratory: Negative for cough, chest tightness and shortness of breath.   Cardiovascular: Negative for chest pain.  Gastrointestinal: Negative for nausea, vomiting and abdominal pain.  Genitourinary: Negative for dysuria and hematuria.  Musculoskeletal: Negative for arthralgias, back pain and myalgias.  Skin: Negative for rash.  Neurological: Negative for dizziness, weakness and headaches.  A complete 10 system review of systems was obtained and all systems are negative except as noted in the HPI and PMH.      Allergies  Review of patient's allergies indicates no known allergies.  Home Medications   Prior to Admission medications   Medication Sig Start Date End Date Taking? Authorizing Provider  acetaminophen (TYLENOL) 325 MG tablet Take 650 mg by mouth every 6 (six) hours as needed for moderate pain.   Yes Historical Provider, MD  amLODipine (NORVASC) 10 MG tablet Take 10 mg by mouth at bedtime.  05/16/11  Yes Historical Provider, MD  atorvastatin (LIPITOR) 40 MG tablet  Take 40 mg by mouth at bedtime.  04/23/11  Yes Historical Provider, MD  carvedilol (COREG) 12.5 MG tablet Take 1 tablet (12.5 mg total) by mouth 2 (two) times daily with a meal. 01/09/14  Yes Carlena Bjornstad, MD  clopidogrel (PLAVIX) 75 MG tablet Take 1 tablet (75 mg total) by mouth daily. 04/03/14  Yes Satira Sark, MD  ergocalciferol (VITAMIN D2) 50000 UNITS capsule Take 50,000 Units by mouth every Wednesday.    Yes Historical Provider, MD  finasteride (PROSCAR) 5 MG tablet Take 5 mg by mouth daily.   Yes Historical Provider, MD  hydrochlorothiazide (MICROZIDE) 12.5 MG capsule Take 1 capsule  (12.5 mg total) by mouth every morning. 03/29/14  Yes Satira Sark, MD  lisinopril (PRINIVIL,ZESTRIL) 30 MG tablet Take 30 mg by mouth daily.  04/23/11  Yes Historical Provider, MD  pantoprazole (PROTONIX) 40 MG tablet Take 40 mg by mouth daily. 06/28/14  Yes Historical Provider, MD  nitroGLYCERIN (NITROSTAT) 0.4 MG SL tablet Place 1 tablet (0.4 mg total) under the tongue every 5 (five) minutes as needed for chest pain. 12/21/12   Satira Sark, MD   BP 130/59  Pulse 50  Temp(Src) 97.3 F (36.3 C)  Resp 14  SpO2 98% Physical Exam  Nursing note and vitals reviewed. Constitutional: He is oriented to person, place, and time. He appears well-developed and well-nourished. No distress.  HENT:  Head: Normocephalic and atraumatic.  Mouth/Throat: Oropharynx is clear and moist. No oropharyngeal exudate.  Eyes: Conjunctivae and EOM are normal. Pupils are equal, round, and reactive to light.  Neck: Normal range of motion. Neck supple.  No meningismus. No appreciable bruit  Cardiovascular: Normal rate, regular rhythm, normal heart sounds and intact distal pulses.   No murmur heard. Pulmonary/Chest: Effort normal and breath sounds normal. No respiratory distress.  Abdominal: Soft. There is no tenderness. There is no rebound and no guarding.  Musculoskeletal: Normal range of motion. He exhibits no edema and no tenderness.  Neurological: He is alert and oriented to person, place, and time. No cranial nerve deficit. He exhibits normal muscle tone. Coordination normal.  No ataxia on finger to nose bilaterally. No pronator drift. 5/5 strength throughout. CN 2-12 intact. Negative Romberg. Equal grip strength. Sensation intact. Gait is normal.   Skin: Skin is warm.  Psychiatric: He has a normal mood and affect. His behavior is normal.    ED Course  Procedures (including critical care time) Labs Review Labs Reviewed  COMPREHENSIVE METABOLIC PANEL - Abnormal; Notable for the following:     Creatinine, Ser 1.76 (*)    Albumin 3.4 (*)    GFR calc non Af Amer 36 (*)    GFR calc Af Amer 42 (*)    All other components within normal limits  URINALYSIS, ROUTINE W REFLEX MICROSCOPIC - Abnormal; Notable for the following:    APPearance CLOUDY (*)    Leukocytes, UA LARGE (*)    All other components within normal limits  I-STAT CHEM 8, ED - Abnormal; Notable for the following:    Sodium 136 (*)    Creatinine, Ser 1.80 (*)    All other components within normal limits  ETHANOL  PROTIME-INR  APTT  CBC  DIFFERENTIAL  URINE RAPID DRUG SCREEN (HOSP PERFORMED)  URINE MICROSCOPIC-ADD ON  I-STAT TROPOININ, ED  I-STAT TROPOININ, ED    Imaging Review Ct Head Wo Contrast  07/06/2014   CLINICAL DATA:  Headache  EXAM: CT HEAD WITHOUT CONTRAST  TECHNIQUE: Contiguous axial images  were obtained from the base of the skull through the vertex without intravenous contrast.  COMPARISON:  None.  FINDINGS: Negative for acute intracranial hemorrhage, acute infarction, mass, mass effect, hydrocephalus or midline shift. Gray-white differentiation is preserved throughout. Very mild cerebral volume loss likely related to age. No focal soft tissue or calvarial abnormality. Normal aeration of the mastoid air cells and paranasal sinuses. Atherosclerotic calcifications throughout the bilateral cavernous and supra clinoid carotid arteries. The bilateral superior ophthalmic veins are equal in size.  IMPRESSION: Negative head CT.   Electronically Signed   By: Jacqulynn Cadet M.D.   On: 07/06/2014 14:53     EKG Interpretation   Date/Time:  Friday July 06 2014 13:30:46 EDT Ventricular Rate:  50 PR Interval:  204 QRS Duration: 118 QT Interval:  442 QTC Calculation: 403 R Axis:   37 Text Interpretation:  Sinus rhythm Ventricular premature complex  Incomplete left bundle branch block No significant change was found  Confirmed by Wyvonnia Dusky  MD, [place] [phone]) on 07/06/2014 1:56:39 PM      MDM   Final  diagnoses:  None   Patient sent from optometrist with retinal hemorrhages in left with concern for carotid occlusion. He endorses gradually decreasing vision in his left eye for the past several days. No evidence of other neurological abnormality. No focal weakness, numbness or tingling. No difficulty breathing, swallowing or speaking.  D/w optometrist Dr. Marin Comment.  [phone].  She believes she heard a carotid bruit. She feels patient needs carotid dopplers and stroke workup with hypercoaguable workup.  D/w dr. Curly Rim of hospitalists who also spoke with Dr. Marin Comment.  He feels if carotid dopplers negative and MRI negative, patient can be discharged to have rest of workup as outpatient.   Carotid dopplers and MRI/MRA pending at time of sign out to Dr. Betsey Holiday.   Ezequiel Essex, MD 07/06/14 [phone]

## 2014-07-06 NOTE — Progress Notes (Signed)
*  PRELIMINARY RESULTS* Vascular Ultrasound Carotid Doppler has been completed.  Preliminary findings: Bilateral:  1-39% ICA stenosis.  Vertebral artery flow is antegrade.      Landry Mellow, RDMS, RVT  07/06/2014, 5:58 PM

## 2014-07-06 NOTE — ED Provider Notes (Signed)
Patient signed out to me by Doctor Rancour to followup on MRI and carotid Dopplers. Patient had been seen at the request of his ophthalmologist because he was noted to have retinal hemorrhages. He was sent to the ER for further workup. At time of signout, MR and carotid duplex are pending. Case had also been discussed with Doctor Bonnielee Haff who did not feel that the patient required hospitalization. He recommended performing the studies and discharge if negative.  Patient had MR studies which were read as negative. I discussed this with the radiologist. Bilateral carotid duplex also revealed minimal, 1-39% stenosis. This does not require intervention. Patient already taking Plavix. He will be discharged, follow up with primary doctor for ongoing treatment. He is also being referred to a retinal specialist by the optometrist that referred him to the ER.  Orpah Greek, MD 07/06/14 [phone]

## 2014-07-06 NOTE — ED Notes (Addendum)
Speech therapy is aware that pt needs swallow eval.  [name] [name] stated it would be several hours before they could evaluate him.

## 2014-07-12 ENCOUNTER — Encounter (INDEPENDENT_AMBULATORY_CARE_PROVIDER_SITE_OTHER): Payer: No Typology Code available for payment source | Admitting: Ophthalmology

## 2014-07-12 DIAGNOSIS — H43819 Vitreous degeneration, unspecified eye: Secondary | ICD-10-CM

## 2014-07-12 DIAGNOSIS — H3530 Unspecified macular degeneration: Secondary | ICD-10-CM

## 2014-07-12 DIAGNOSIS — H348192 Central retinal vein occlusion, unspecified eye, stable: Secondary | ICD-10-CM

## 2014-07-12 DIAGNOSIS — D3130 Benign neoplasm of unspecified choroid: Secondary | ICD-10-CM

## 2014-07-12 DIAGNOSIS — I10 Essential (primary) hypertension: Secondary | ICD-10-CM

## 2014-07-12 DIAGNOSIS — H35039 Hypertensive retinopathy, unspecified eye: Secondary | ICD-10-CM

## 2014-07-12 DIAGNOSIS — H2510 Age-related nuclear cataract, unspecified eye: Secondary | ICD-10-CM

## 2014-07-23 ENCOUNTER — Ambulatory Visit (INDEPENDENT_AMBULATORY_CARE_PROVIDER_SITE_OTHER): Payer: Medicare Other | Admitting: Cardiology

## 2014-07-23 ENCOUNTER — Encounter: Payer: Self-pay | Admitting: Cardiology

## 2014-07-23 VITALS — BP 112/68 | HR 56 | Ht 65.0 in | Wt 188.0 lb

## 2014-07-23 DIAGNOSIS — I10 Essential (primary) hypertension: Secondary | ICD-10-CM

## 2014-07-23 DIAGNOSIS — I2589 Other forms of chronic ischemic heart disease: Secondary | ICD-10-CM

## 2014-07-23 DIAGNOSIS — I2510 Atherosclerotic heart disease of native coronary artery without angina pectoris: Secondary | ICD-10-CM

## 2014-07-23 NOTE — Assessment & Plan Note (Signed)
Blood pressure is normal today. 

## 2014-07-23 NOTE — Assessment & Plan Note (Signed)
LVEF 50% by Cardiolite in 2013.

## 2014-07-23 NOTE — Assessment & Plan Note (Signed)
Symptomatically stable with multivessel disease status post CABG. Ischemic workup within the last 2 years was overall low risk. No changes made today.

## 2014-07-23 NOTE — Patient Instructions (Signed)
Your physician wants you to follow-up in: 6 months with Dr.McDowell You will receive a reminder letter in the mail two months in advance. If you don't receive a letter, please call our office to schedule the follow-up appointment.     Your physician recommends that you continue on your current medications as directed. Please refer to the Current Medication list given to you today.     Thank you for choosing [facility] !        

## 2014-07-23 NOTE — Progress Notes (Signed)
Clinical Summary [name] [name] is a 78 y.o.male last seen in March. He reports no angina symptoms, no palpitations or syncope. States that he has been taking his medications regularly.  Interval records reviewed. He had a retinal hemorrhage noted, underwent neuroimaging without acute findings. Carotid Dopplers showed 1-39% bilateral ICA stenoses.  Lexiscan Myoview done in August 2013 was overall low risk, equivocal ST segment changes, scar involving the apex and inferior wall without any large residual ischemia, LVEF 56% with inferior hypokinesis.  Recent ECG from August showed sinus bradycardia with IVCD.  No Known Allergies  Current Outpatient Prescriptions  Medication Sig Dispense Refill  . amLODipine (NORVASC) 10 MG tablet Take 10 mg by mouth at bedtime.       Marland Kitchen atorvastatin (LIPITOR) 40 MG tablet Take 40 mg by mouth at bedtime.       . carvedilol (COREG) 12.5 MG tablet Take 1 tablet (12.5 mg total) by mouth 2 (two) times daily with a meal.  60 tablet  6  . clopidogrel (PLAVIX) 75 MG tablet Take 1 tablet (75 mg total) by mouth daily.  30 tablet  6  . ergocalciferol (VITAMIN D2) 50000 UNITS capsule Take 50,000 Units by mouth every Wednesday.       . finasteride (PROSCAR) 5 MG tablet Take 5 mg by mouth daily.      . hydrochlorothiazide (MICROZIDE) 12.5 MG capsule Take 1 capsule (12.5 mg total) by mouth every morning.  30 capsule  6  . lisinopril (PRINIVIL,ZESTRIL) 30 MG tablet Take 30 mg by mouth daily.       . nitroGLYCERIN (NITROSTAT) 0.4 MG SL tablet Place 1 tablet (0.4 mg total) under the tongue every 5 (five) minutes as needed for chest pain.  25 tablet  3  . pantoprazole (PROTONIX) 40 MG tablet Take 40 mg by mouth daily.       No current facility-administered medications for this visit.    Past Medical History  Diagnosis Date  . Diverticulosis 2007    Colonoscopy  . Mixed hyperlipidemia   . Essential hypertension, benign   . Coronary atherosclerosis of native coronary  artery     Multivessel and bypass graft disease  . Ischemic cardiomyopathy     LVEF 35%  . Fatty liver disease, nonalcoholic   . Chronic constipation   . Nephrolithiasis   . Hiatal hernia   . Schatzki's ring     Last EGD/ED Dr Paul Half  . GERD (gastroesophageal reflux disease)   . Renal insufficiency   . Myocardial infarction   . Headache(784.0)   . Arthritis     Past Surgical History  Procedure Laterality Date  . Coronary artery bypass graft  2002    LIMA to LAD, SVG to diagonal and ramus, SVG to PDA and PLA  . Nephrectomy      left  . Kidney stone surgery      right  . Knee arthroscopy      left  . Hemorrhoid surgery    . Total knee arthroplasty  08/01/2012    Procedure: TOTAL KNEE ARTHROPLASTY;  Surgeon: Tobi Bastos, MD;  Location: [facility];  Service: Orthopedics;  Laterality: Left;    Social History [name] [name] reports that he quit smoking about 53 years ago. His smoking use included Cigarettes. He has a 1 pack-year smoking history. His smokeless tobacco use includes Chew. [name] [name] reports that he does not drink alcohol.  Review of Systems Other systems reviewed and negative except as outlined.  Physical  Examination  Filed Vitals:   07/23/14 1118  BP: 112/68  Pulse: 56   Filed Weights   07/23/14 1118  Weight: 188 lb (85.276 kg)    Overweight male in no acute distress.  HEENT: Conjunctiva and lids normal, oropharynx with poor dentition.  Neck: Supple, no elevated JVP or bruits.  Lungs: Diminished, nonlabored.  Cardiac: Regular rate and rhythm, indistinct PMI, no S3.  Abdomen: Soft, nontender, bowel sounds present.  Skin: Warm and dry.  Extremities: 1+ edema below the knees, symmetrical. Distal pulses one plus.  Musculoskeletal: No gross deformities.  Neuropsychiatric: Alert and oriented x3, affect appropriate.   Problem List and Plan   CORONARY ATHEROSCLEROSIS NATIVE CORONARY ARTERY Symptomatically stable with multivessel disease status post  CABG. Ischemic workup within the last 2 years was overall low risk. No changes made today.  CARDIOMYOPATHY, ISCHEMIC LVEF 50% by Cardiolite in 2013.  Essential hypertension, benign Blood pressure is normal today.    Satira Sark, M.D., F.A.C.C.

## 2014-07-27 ENCOUNTER — Encounter (HOSPITAL_COMMUNITY): Payer: Self-pay | Admitting: Emergency Medicine

## 2014-07-27 ENCOUNTER — Emergency Department (HOSPITAL_COMMUNITY): Payer: Medicare Other

## 2014-07-27 ENCOUNTER — Emergency Department (HOSPITAL_COMMUNITY)
Admission: EM | Admit: 2014-07-27 | Discharge: 2014-07-27 | Disposition: A | Discharge status: Home / Self Care | Payer: Medicare Other | Attending: Emergency Medicine | Admitting: Emergency Medicine

## 2014-07-27 DIAGNOSIS — Z87891 Personal history of nicotine dependence: Secondary | ICD-10-CM | POA: Diagnosis not present

## 2014-07-27 DIAGNOSIS — I2510 Atherosclerotic heart disease of native coronary artery without angina pectoris: Secondary | ICD-10-CM | POA: Insufficient documentation

## 2014-07-27 DIAGNOSIS — Z79899 Other long term (current) drug therapy: Secondary | ICD-10-CM | POA: Insufficient documentation

## 2014-07-27 DIAGNOSIS — Z7902 Long term (current) use of antithrombotics/antiplatelets: Secondary | ICD-10-CM | POA: Diagnosis not present

## 2014-07-27 DIAGNOSIS — Z87442 Personal history of urinary calculi: Secondary | ICD-10-CM | POA: Diagnosis not present

## 2014-07-27 DIAGNOSIS — Q391 Atresia of esophagus with tracheo-esophageal fistula: Secondary | ICD-10-CM | POA: Insufficient documentation

## 2014-07-27 DIAGNOSIS — E782 Mixed hyperlipidemia: Secondary | ICD-10-CM | POA: Diagnosis not present

## 2014-07-27 DIAGNOSIS — I252 Old myocardial infarction: Secondary | ICD-10-CM | POA: Insufficient documentation

## 2014-07-27 DIAGNOSIS — Z951 Presence of aortocoronary bypass graft: Secondary | ICD-10-CM | POA: Insufficient documentation

## 2014-07-27 DIAGNOSIS — Q393 Congenital stenosis and stricture of esophagus: Secondary | ICD-10-CM

## 2014-07-27 DIAGNOSIS — M129 Arthropathy, unspecified: Secondary | ICD-10-CM | POA: Insufficient documentation

## 2014-07-27 DIAGNOSIS — I10 Essential (primary) hypertension: Secondary | ICD-10-CM | POA: Insufficient documentation

## 2014-07-27 DIAGNOSIS — N390 Urinary tract infection, site not specified: Secondary | ICD-10-CM | POA: Diagnosis not present

## 2014-07-27 DIAGNOSIS — Z792 Long term (current) use of antibiotics: Secondary | ICD-10-CM | POA: Insufficient documentation

## 2014-07-27 DIAGNOSIS — R197 Diarrhea, unspecified: Secondary | ICD-10-CM | POA: Diagnosis present

## 2014-07-27 LAB — CBC WITH DIFFERENTIAL/PLATELET
Basophils Absolute: 0.1 10*3/uL (ref 0.0–0.1)
Basophils Relative: 1 % (ref 0–1)
Eosinophils Absolute: 0.3 10*3/uL (ref 0.0–0.7)
Eosinophils Relative: 4 % (ref 0–5)
HCT: 44.4 % (ref 39.0–52.0)
Hemoglobin: 15.4 g/dL (ref 13.0–17.0)
Lymphocytes Relative: 22 % (ref 12–46)
Lymphs Abs: 1.4 10*3/uL (ref 0.7–4.0)
MCH: 27.7 pg (ref 26.0–34.0)
MCHC: 34.7 g/dL (ref 30.0–36.0)
MCV: 80 fL (ref 78.0–100.0)
Monocytes Absolute: 0.5 10*3/uL (ref 0.1–1.0)
Monocytes Relative: 8 % (ref 3–12)
Neutro Abs: 4.2 10*3/uL (ref 1.7–7.7)
Neutrophils Relative %: 65 % (ref 43–77)
PLATELETS: 180 10*3/uL (ref 150–400)
RBC: 5.55 MIL/uL (ref 4.22–5.81)
RDW: 13.7 % (ref 11.5–15.5)
WBC: 6.5 10*3/uL (ref 4.0–10.5)

## 2014-07-27 LAB — URINALYSIS, ROUTINE W REFLEX MICROSCOPIC
Bilirubin Urine: NEGATIVE
Glucose, UA: NEGATIVE mg/dL
HGB URINE DIPSTICK: NEGATIVE
Ketones, ur: NEGATIVE mg/dL
Nitrite: NEGATIVE
Protein, ur: NEGATIVE mg/dL
Specific Gravity, Urine: 1.01 (ref 1.005–1.030)
UROBILINOGEN UA: 0.2 mg/dL (ref 0.0–1.0)
pH: 5.5 (ref 5.0–8.0)

## 2014-07-27 LAB — COMPREHENSIVE METABOLIC PANEL
ALT: 12 U/L (ref 0–53)
AST: 19 U/L (ref 0–37)
Albumin: 3.6 g/dL (ref 3.5–5.2)
Alkaline Phosphatase: 80 U/L (ref 39–117)
Anion gap: 12 (ref 5–15)
BUN: 16 mg/dL (ref 6–23)
CO2: 25 mEq/L (ref 19–32)
Calcium: 9.2 mg/dL (ref 8.4–10.5)
Chloride: 104 mEq/L (ref 96–112)
Creatinine, Ser: 1.67 mg/dL — ABNORMAL HIGH (ref 0.50–1.35)
GFR calc non Af Amer: 38 mL/min — ABNORMAL LOW (ref >90)
GFR, EST AFRICAN AMERICAN: 45 mL/min — AB (ref >90)
Glucose, Bld: 94 mg/dL (ref 70–99)
Potassium: 3.9 mEq/L (ref 3.7–5.3)
SODIUM: 141 meq/L (ref 137–147)
TOTAL PROTEIN: 7.1 g/dL (ref 6.0–8.3)
Total Bilirubin: 0.6 mg/dL (ref 0.3–1.2)

## 2014-07-27 LAB — URINE MICROSCOPIC-ADD ON

## 2014-07-27 MED ORDER — ONDANSETRON HCL 4 MG/2ML IJ SOLN
4.0000 mg | Freq: Once | INTRAMUSCULAR | Status: AC
  Administered 2014-07-27: 4 mg via INTRAVENOUS
  Filled 2014-07-27: qty 2

## 2014-07-27 MED ORDER — DEXTROSE 5 % IV SOLN
1.0000 g | Freq: Once | INTRAVENOUS | Status: AC
  Administered 2014-07-27: 1 g via INTRAVENOUS
  Filled 2014-07-27: qty 10

## 2014-07-27 MED ORDER — SODIUM CHLORIDE 0.9 % IV BOLUS (SEPSIS)
500.0000 mL | Freq: Once | INTRAVENOUS | Status: AC
  Administered 2014-07-27: 500 mL via INTRAVENOUS

## 2014-07-27 MED ORDER — CIPROFLOXACIN HCL 500 MG PO TABS: 500.0000 mg | ORAL_TABLET | Freq: Two times a day (BID) | ORAL | Status: DC

## 2014-07-27 MED ORDER — DIPHENOXYLATE-ATROPINE 2.5-0.025 MG PO TABS: 1.0000 | ORAL_TABLET | Freq: Three times a day (TID) | ORAL | Status: DC | PRN

## 2014-07-27 NOTE — ED Notes (Signed)
Pt's states he has been having diarrhea for a 1 week, with lower back pain.

## 2014-07-27 NOTE — ED Provider Notes (Signed)
CSN: [id number]     Arrival date & time 07/27/14  0607 History  This chart was scribed for Nat Christen, MD by Rayfield Citizen, ED Scribe. This patient was seen in room APA18/APA18 and the patient's care was started at 7:25 AM.    Chief Complaint  Patient presents with  . Back Pain  . Diarrhea   HPI  HPI Comments: [name] [name] is a 78 y.o. male who presents to the Emergency Department complaining of 1 week of intermittent diarrhea with associated gradually worsened, constant, central lower back pain. Patient states he has had diarrhea 3-4x today; he describes it as brown and watery. He reports he is eating and drinking normally. He is not on Abx at this time; he is taking his normal medications as prescribed by his PCP. He states he has had back problems his whole life but notes that this level of back pain is unusual. He notes a recent "shot in his eye" for an infection via a doctor in [place].   His PCP is Dr. Edrick Oh.   Past Medical History  Diagnosis Date  . Diverticulosis 2007    Colonoscopy  . Mixed hyperlipidemia   . Essential hypertension, benign   . Coronary atherosclerosis of native coronary artery     Multivessel and bypass graft disease  . Ischemic cardiomyopathy     LVEF 35%  . Fatty liver disease, nonalcoholic   . Chronic constipation   . Nephrolithiasis   . Hiatal hernia   . Schatzki's ring     Last EGD/ED Dr Paul Half  . GERD (gastroesophageal reflux disease)   . Renal insufficiency   . Myocardial infarction   . Headache(784.0)   . Arthritis    Past Surgical History  Procedure Laterality Date  . Coronary artery bypass graft  2002    LIMA to LAD, SVG to diagonal and ramus, SVG to PDA and PLA  . Nephrectomy      left  . Kidney stone surgery      right  . Knee arthroscopy      left  . Hemorrhoid surgery    . Total knee arthroplasty  08/01/2012    Procedure: TOTAL KNEE ARTHROPLASTY;  Surgeon: Tobi Bastos, MD;  Location: [facility];  Service: Orthopedics;   Laterality: Left;   Family History  Problem Relation Age of Onset  . Coronary artery disease Father    History  Substance Use Topics  . Smoking status: Former Smoker -- 0.50 packs/day for 2 years    Types: Cigarettes    Quit date: 05/20/1961  . Smokeless tobacco: Current User    Types: Chew     Comment: quit 50 yrs ago  . Alcohol Use: No    Review of Systems  A complete 10 system review of systems was obtained and all systems are negative except as noted in the HPI and PMH.    Allergies  Review of patient's allergies indicates no known allergies.  Home Medications   Prior to Admission medications   Medication Sig Start Date End Date Taking? Authorizing Provider  acetaminophen (TYLENOL) 500 MG tablet Take 1,000 mg by mouth every 6 (six) hours as needed for mild pain or moderate pain.   Yes Historical Provider, MD  amLODipine (NORVASC) 10 MG tablet Take 10 mg by mouth at bedtime.  05/16/11  Yes Historical Provider, MD  atorvastatin (LIPITOR) 40 MG tablet Take 40 mg by mouth at bedtime.  04/23/11  Yes Historical Provider, MD  carvedilol (COREG) 12.5  MG tablet Take 1 tablet (12.5 mg total) by mouth 2 (two) times daily with a meal. 01/09/14  Yes Carlena Bjornstad, MD  clopidogrel (PLAVIX) 75 MG tablet Take 1 tablet (75 mg total) by mouth daily. 04/03/14  Yes Satira Sark, MD  ergocalciferol (VITAMIN D2) 50000 UNITS capsule Take 50,000 Units by mouth every Wednesday.    Yes Historical Provider, MD  finasteride (PROSCAR) 5 MG tablet Take 5 mg by mouth daily.   Yes Historical Provider, MD  hydrochlorothiazide (MICROZIDE) 12.5 MG capsule Take 1 capsule (12.5 mg total) by mouth every morning. 03/29/14  Yes Satira Sark, MD  lisinopril (PRINIVIL,ZESTRIL) 30 MG tablet Take 30 mg by mouth daily.  04/23/11  Yes Historical Provider, MD  nitroGLYCERIN (NITROSTAT) 0.4 MG SL tablet Place 1 tablet (0.4 mg total) under the tongue every 5 (five) minutes as needed for chest pain. 12/21/12  Yes Satira Sark, MD  ciprofloxacin (CIPRO) 500 MG tablet Take 1 tablet (500 mg total) by mouth 2 (two) times daily. 07/27/14   Nat Christen, MD  diphenoxylate-atropine (LOMOTIL) 2.5-0.025 MG per tablet Take 1 tablet by mouth 3 (three) times daily as needed for diarrhea or loose stools. 07/27/14   Nat Christen, MD   BP 125/75  Pulse 49  Temp(Src) 97.7 F (36.5 C) (Oral)  Resp 18  Ht 5\' 5"  (1.651 m)  Wt 200 lb (90.719 kg)  BMI 33.28 kg/m2  SpO2 98% Physical Exam  Nursing note and vitals reviewed. Constitutional: He is oriented to person, place, and time. He appears well-developed and well-nourished.  HENT:  Head: Normocephalic and atraumatic.  Eyes: Conjunctivae and EOM are normal. Pupils are equal, round, and reactive to light.  Neck: Normal range of motion. Neck supple.  Cardiovascular: Normal rate, regular rhythm and normal heart sounds.   Pulmonary/Chest: Effort normal and breath sounds normal.  Abdominal: Soft. Bowel sounds are normal. There is no tenderness.  Musculoskeletal: Normal range of motion.       Lumbar back: He exhibits tenderness (Minimal).  Neurological: He is alert and oriented to person, place, and time.  Skin: Skin is warm and dry.  Psychiatric: He has a normal mood and affect. His behavior is normal.    ED Course  Procedures (including critical care time)  DIAGNOSTIC STUDIES: Oxygen Saturation is 98% on RA, normal by my interpretation.    COORDINATION OF CARE: 7:30 AM Discussed treatment plan with pt at bedside; patient will receive IV fluids while lab work is done. Patient agreed to plan.   Labs Review Labs Reviewed  COMPREHENSIVE METABOLIC PANEL - Abnormal; Notable for the following:    Creatinine, Ser 1.67 (*)    GFR calc non Af Amer 38 (*)    GFR calc Af Amer 45 (*)    All other components within normal limits  URINALYSIS, ROUTINE W REFLEX MICROSCOPIC - Abnormal; Notable for the following:    Leukocytes, UA LARGE (*)    All other components within normal  limits  STOOL CULTURE  URINE CULTURE  CBC WITH DIFFERENTIAL  URINE MICROSCOPIC-ADD ON    Imaging Review Dg Abd Acute W/chest  07/27/2014   CLINICAL DATA:  Abdominal pain  EXAM: ACUTE ABDOMEN SERIES (ABDOMEN 2 VIEW & CHEST 1 VIEW)  COMPARISON:  None.  FINDINGS: Cardiac shadow is within normal limits. Postsurgical changes are noted. The lungs are well aerated bilaterally. A large hiatal hernia is seen.  No free intraperitoneal air is noted. Scattered large and small bowel gas is  noted. Prostatic calcifications are seen. Degenerative changes of the lumbar spine are noted. No obstructive changes are seen.  IMPRESSION: Nonspecific chest and abdomen.  Large hiatal hernia is noted.   Electronically Signed   By: Inez Catalina M.D.   On: 07/27/2014 07:29     EKG Interpretation None      MDM   Final diagnoses:  UTI (lower urinary tract infection)  Diarrhea   Patient is nontoxic. No acute abdomen. Will Rx for urinary tract infection with Rocephin 1 g IV. Urine culture obtained. Discharge medications Cipro 500 mg and Lomotil. Stool for culture obtained.  I personally performed the services described in this documentation, which was scribed in my presence. The recorded information has been reviewed and is accurate.      Nat Christen, MD 07/27/14 [phone]

## 2014-07-27 NOTE — ED Notes (Signed)
Meal tray given per pt request.

## 2014-07-27 NOTE — Discharge Instructions (Signed)
You have a urinary tract infection. Antibiotic for same. Also prescription for diarrhea medication. We will contact you if you have an infection in your bowel movements.  Increase fluids.

## 2014-07-28 LAB — URINE CULTURE
Colony Count: 30000
SPECIAL REQUESTS: NORMAL

## 2014-07-31 LAB — STOOL CULTURE

## 2014-08-09 ENCOUNTER — Encounter (INDEPENDENT_AMBULATORY_CARE_PROVIDER_SITE_OTHER): Payer: No Typology Code available for payment source | Admitting: Ophthalmology

## 2014-08-09 DIAGNOSIS — H3531 Nonexudative age-related macular degeneration: Secondary | ICD-10-CM

## 2014-08-09 DIAGNOSIS — H35033 Hypertensive retinopathy, bilateral: Secondary | ICD-10-CM

## 2014-08-09 DIAGNOSIS — D3131 Benign neoplasm of right choroid: Secondary | ICD-10-CM

## 2014-08-09 DIAGNOSIS — H43813 Vitreous degeneration, bilateral: Secondary | ICD-10-CM

## 2014-08-09 DIAGNOSIS — I10 Essential (primary) hypertension: Secondary | ICD-10-CM

## 2014-08-09 DIAGNOSIS — H34812 Central retinal vein occlusion, left eye: Secondary | ICD-10-CM

## 2014-09-04 ENCOUNTER — Telehealth: Payer: Self-pay | Admitting: Cardiology

## 2014-09-04 MED ORDER — CARVEDILOL 12.5 MG PO TABS: 12.5000 mg | ORAL_TABLET | Freq: Two times a day (BID) | ORAL | Status: DC

## 2014-09-04 NOTE — Telephone Encounter (Signed)
Received fax refill request  Rx # [id number] Medication:  Carvedilol 12.5 mg tablet Qty 60 Sig:  Take one tablet by mouth two times daily with a meal Physician:  Domenic Polite

## 2014-09-13 ENCOUNTER — Encounter (INDEPENDENT_AMBULATORY_CARE_PROVIDER_SITE_OTHER): Payer: No Typology Code available for payment source | Admitting: Ophthalmology

## 2014-09-13 DIAGNOSIS — D3131 Benign neoplasm of right choroid: Secondary | ICD-10-CM

## 2014-09-13 DIAGNOSIS — I10 Essential (primary) hypertension: Secondary | ICD-10-CM

## 2014-09-13 DIAGNOSIS — H34812 Central retinal vein occlusion, left eye: Secondary | ICD-10-CM

## 2014-09-13 DIAGNOSIS — H35033 Hypertensive retinopathy, bilateral: Secondary | ICD-10-CM

## 2014-09-13 DIAGNOSIS — H3531 Nonexudative age-related macular degeneration: Secondary | ICD-10-CM

## 2014-10-11 ENCOUNTER — Encounter (INDEPENDENT_AMBULATORY_CARE_PROVIDER_SITE_OTHER): Payer: No Typology Code available for payment source | Admitting: Ophthalmology

## 2014-10-11 DIAGNOSIS — H43813 Vitreous degeneration, bilateral: Secondary | ICD-10-CM

## 2014-10-11 DIAGNOSIS — D3131 Benign neoplasm of right choroid: Secondary | ICD-10-CM

## 2014-10-11 DIAGNOSIS — H3531 Nonexudative age-related macular degeneration: Secondary | ICD-10-CM

## 2014-10-11 DIAGNOSIS — H34812 Central retinal vein occlusion, left eye: Secondary | ICD-10-CM

## 2014-10-11 DIAGNOSIS — I10 Essential (primary) hypertension: Secondary | ICD-10-CM

## 2014-10-11 DIAGNOSIS — H35033 Hypertensive retinopathy, bilateral: Secondary | ICD-10-CM

## 2014-10-22 ENCOUNTER — Other Ambulatory Visit (HOSPITAL_COMMUNITY): Payer: Self-pay | Admitting: Nephrology

## 2014-10-22 DIAGNOSIS — N183 Chronic kidney disease, stage 3 unspecified: Secondary | ICD-10-CM

## 2014-10-25 ENCOUNTER — Other Ambulatory Visit: Payer: Self-pay | Admitting: Cardiology

## 2014-10-25 MED ORDER — HYDROCHLOROTHIAZIDE 12.5 MG PO CAPS: 12.5000 mg | ORAL_CAPSULE | Freq: Every morning | ORAL | Status: DC

## 2014-10-25 NOTE — Telephone Encounter (Signed)
Medication sent to pharmacy  

## 2014-10-25 NOTE — Telephone Encounter (Signed)
Received fax refill request  Rx # [id number] Medication:  Hydrochlorothiazide 12.5 mg cap Qty 30 Sig:  Take one capsule by mouth every morning Physician:  Domenic Polite

## 2014-11-05 ENCOUNTER — Other Ambulatory Visit: Payer: Self-pay | Admitting: Cardiology

## 2014-11-06 ENCOUNTER — Encounter (INDEPENDENT_AMBULATORY_CARE_PROVIDER_SITE_OTHER): Payer: No Typology Code available for payment source | Admitting: Ophthalmology

## 2014-11-06 DIAGNOSIS — I10 Essential (primary) hypertension: Secondary | ICD-10-CM

## 2014-11-06 DIAGNOSIS — D3131 Benign neoplasm of right choroid: Secondary | ICD-10-CM

## 2014-11-06 DIAGNOSIS — H3531 Nonexudative age-related macular degeneration: Secondary | ICD-10-CM

## 2014-11-06 DIAGNOSIS — H2511 Age-related nuclear cataract, right eye: Secondary | ICD-10-CM

## 2014-11-06 DIAGNOSIS — H43813 Vitreous degeneration, bilateral: Secondary | ICD-10-CM

## 2014-11-06 DIAGNOSIS — H35033 Hypertensive retinopathy, bilateral: Secondary | ICD-10-CM

## 2014-11-06 DIAGNOSIS — H34812 Central retinal vein occlusion, left eye: Secondary | ICD-10-CM

## 2014-11-15 ENCOUNTER — Ambulatory Visit (HOSPITAL_COMMUNITY)
Admission: RE | Admit: 2014-11-15 | Discharge: 2014-11-15 | Disposition: A | Discharge status: Home / Self Care | Payer: Medicare HMO | Source: Ambulatory Visit | Attending: Nephrology | Admitting: Nephrology

## 2014-11-15 DIAGNOSIS — N281 Cyst of kidney, acquired: Secondary | ICD-10-CM | POA: Diagnosis not present

## 2014-11-15 DIAGNOSIS — Z905 Acquired absence of kidney: Secondary | ICD-10-CM | POA: Diagnosis not present

## 2014-11-15 DIAGNOSIS — N183 Chronic kidney disease, stage 3 unspecified: Secondary | ICD-10-CM

## 2014-12-11 ENCOUNTER — Other Ambulatory Visit: Payer: Self-pay | Admitting: Cardiology

## 2014-12-11 NOTE — Telephone Encounter (Signed)
rx faxed to [facility]

## 2014-12-11 NOTE — Telephone Encounter (Signed)
Please see refill bin / tgs  °

## 2014-12-13 ENCOUNTER — Encounter (INDEPENDENT_AMBULATORY_CARE_PROVIDER_SITE_OTHER): Payer: No Typology Code available for payment source | Admitting: Ophthalmology

## 2014-12-14 ENCOUNTER — Encounter (INDEPENDENT_AMBULATORY_CARE_PROVIDER_SITE_OTHER): Payer: No Typology Code available for payment source | Admitting: Ophthalmology

## 2014-12-17 ENCOUNTER — Encounter (INDEPENDENT_AMBULATORY_CARE_PROVIDER_SITE_OTHER): Payer: Medicare PPO | Admitting: Ophthalmology

## 2014-12-17 DIAGNOSIS — H43813 Vitreous degeneration, bilateral: Secondary | ICD-10-CM

## 2014-12-17 DIAGNOSIS — H2511 Age-related nuclear cataract, right eye: Secondary | ICD-10-CM

## 2014-12-17 DIAGNOSIS — D3131 Benign neoplasm of right choroid: Secondary | ICD-10-CM

## 2014-12-17 DIAGNOSIS — I10 Essential (primary) hypertension: Secondary | ICD-10-CM

## 2014-12-17 DIAGNOSIS — H35033 Hypertensive retinopathy, bilateral: Secondary | ICD-10-CM

## 2014-12-17 DIAGNOSIS — H34812 Central retinal vein occlusion, left eye: Secondary | ICD-10-CM

## 2015-01-07 ENCOUNTER — Ambulatory Visit (INDEPENDENT_AMBULATORY_CARE_PROVIDER_SITE_OTHER): Payer: Medicare HMO | Admitting: Cardiology

## 2015-01-07 ENCOUNTER — Encounter: Payer: Self-pay | Admitting: Cardiology

## 2015-01-07 VITALS — BP 106/64 | HR 55 | Ht 65.0 in | Wt 195.0 lb

## 2015-01-07 DIAGNOSIS — I2510 Atherosclerotic heart disease of native coronary artery without angina pectoris: Secondary | ICD-10-CM

## 2015-01-07 DIAGNOSIS — I10 Essential (primary) hypertension: Secondary | ICD-10-CM

## 2015-01-07 DIAGNOSIS — I255 Ischemic cardiomyopathy: Secondary | ICD-10-CM

## 2015-01-07 NOTE — Patient Instructions (Signed)
Your physician wants you to follow-up in: 6 months   With Dr Virgina Jock will receive a reminder letter in the mail two months in advance. If you don't receive a letter, please call our office to schedule the follow-up appointment.     Your physician recommends that you continue on your current medications as directed. Please refer to the Current Medication list given to you today.      Thank you for choosing [facility] !

## 2015-01-07 NOTE — Progress Notes (Signed)
Cardiology Office Note  Date: 01/07/2015   ID: MELL [name] [name], DOB October 06, 1936, MRN [id number]  PCP: Sherrie Mustache, MD  Primary Cardiologist: Rozann Lesches, MD   Chief Complaint  Patient presents with  . Coronary Artery Disease  . Cardiomyopathy  . Hyperlipidemia    History of Present Illness: [name] [name] is a 79 y.o. male last seen in September 2015. He presents with his wife today for a routine follow-up visit. He reports no increasing angina symptoms or worsening shortness of breath. We reviewed his cardiac medications which include Coreg, lisinopril, Plavix, amlodipine, and HCTZ. As of last ischemic workup LVEF has improved to normal range, and we continue to follow him conservatively for now.  He is due for a follow-up visit with Dr. Edrick Oh.  No palpitations, dizziness, or syncope.   Past Medical History  Diagnosis Date  . Diverticulosis 2007    Colonoscopy  . Mixed hyperlipidemia   . Essential hypertension, benign   . Coronary atherosclerosis of native coronary artery     Multivessel and bypass graft disease  . Ischemic cardiomyopathy     LVEF 35%  . Fatty liver disease, nonalcoholic   . Chronic constipation   . Nephrolithiasis   . Hiatal hernia   . Schatzki's ring     Last EGD/ED Dr Paul Half  . GERD (gastroesophageal reflux disease)   . Renal insufficiency   . Myocardial infarction   . Headache(784.0)   . Arthritis     Past Surgical History  Procedure Laterality Date  . Coronary artery bypass graft  2002    LIMA to LAD, SVG to diagonal and ramus, SVG to PDA and PLA  . Nephrectomy      left  . Kidney stone surgery      right  . Knee arthroscopy      left  . Hemorrhoid surgery    . Total knee arthroplasty  08/01/2012    Procedure: TOTAL KNEE ARTHROPLASTY;  Surgeon: Tobi Bastos, MD;  Location: [facility];  Service: Orthopedics;  Laterality: Left;    Current Outpatient Prescriptions  Medication Sig Dispense Refill  .  acetaminophen (TYLENOL) 500 MG tablet Take 1,000 mg by mouth every 6 (six) hours as needed for mild pain or moderate pain.    Marland Kitchen amLODipine (NORVASC) 10 MG tablet Take 10 mg by mouth at bedtime.     Marland Kitchen atorvastatin (LIPITOR) 40 MG tablet Take 40 mg by mouth at bedtime.     . carvedilol (COREG) 12.5 MG tablet Take 1 tablet (12.5 mg total) by mouth 2 (two) times daily with a meal. 60 tablet 6  . clopidogrel (PLAVIX) 75 MG tablet TAKE 1 TABLET (75 MG TOTAL) BY MOUTH DAILY. 30 tablet 6  . ergocalciferol (VITAMIN D2) 50000 UNITS capsule Take 50,000 Units by mouth every Wednesday.     . finasteride (PROSCAR) 5 MG tablet Take 5 mg by mouth daily.    . hydrochlorothiazide (MICROZIDE) 12.5 MG capsule Take 1 capsule (12.5 mg total) by mouth every morning. 30 capsule 6  . lisinopril (PRINIVIL,ZESTRIL) 30 MG tablet Take 30 mg by mouth daily.     . nitroGLYCERIN (NITROSTAT) 0.4 MG SL tablet Place 1 tablet (0.4 mg total) under the tongue every 5 (five) minutes as needed for chest pain. 25 tablet 3   No current facility-administered medications for this visit.    Allergies:  Review of patient's allergies indicates no known allergies.   Social History: The patient  reports that he quit smoking  about 53 years ago. His smoking use included Cigarettes. He started smoking about 64 years ago. He has a 1 pack-year smoking history. His smokeless tobacco use includes Chew. He reports that he does not drink alcohol or use illicit drugs.   ROS:  Please see the history of present illness. Otherwise, complete review of systems is positive for fatigue and arthritic pains.  All other systems are reviewed and negative.    Physical Exam: VS:  BP 106/64 mmHg  Pulse 55  Ht 5\' 5"  (1.651 m)  Wt 195 lb (88.451 kg)  BMI 32.45 kg/m2  SpO2 98%, BMI Body mass index is 32.45 kg/(m^2).  Wt Readings from Last 3 Encounters:  01/07/15 195 lb (88.451 kg)  07/27/14 200 lb (90.719 kg)  07/23/14 188 lb (85.276 kg)     Overweight  male in no acute distress.  HEENT: Conjunctiva and lids normal, oropharynx with poor dentition.  Neck: Supple, no elevated JVP or bruits.  Lungs: Diminished, nonlabored.  Cardiac: Regular rate and rhythm, indistinct PMI, no S3.  Abdomen: Soft, nontender, bowel sounds present.  Skin: Warm and dry.  Extremities: 1+ edema below the knees, symmetrical. Distal pulses one plus.  Musculoskeletal: No gross deformities.  Neuropsychiatric: Alert and oriented x3, affect appropriate.   ECG: ECG is not ordered today.  Recent Labwork: 07/27/2014: ALT 12; AST 19; BUN 16; Creatinine 1.67*; Hemoglobin 15.4; Platelets 180; Potassium 3.9; Sodium 141   Other Studies Reviewed Today:  Lexiscan Myoview done in August 2013 was overall low risk, equivocal ST segment changes, scar involving the apex and inferior wall without any large residual ischemia, LVEF 56% with inferior hypokinesis.  ASSESSMENT AND PLAN:  1. Multivessel CAD status post CABG, low risk ischemic workup from 2013 with LVEF 56%. Continue medical therapy and observation.  2. Ischemic cardiomyopathy with improvement in LVEF as noted above.  3. Essential hypertension, blood pressure normal today.  Current medicines are reviewed at length with the patient today.  The patient does not have concerns regarding medicines.  Disposition: FU with me in 6 months.   Signed, Satira Sark, MD, [facility] 01/07/2015 4:25 PM    [facility] at [facility] [address] [place], [place] [address] Phone: [phone]; Fax: [phone]

## 2015-01-15 ENCOUNTER — Ambulatory Visit (INDEPENDENT_AMBULATORY_CARE_PROVIDER_SITE_OTHER): Payer: Medicare HMO | Admitting: Urology

## 2015-01-15 DIAGNOSIS — R972 Elevated prostate specific antigen [PSA]: Secondary | ICD-10-CM

## 2015-01-15 DIAGNOSIS — N400 Enlarged prostate without lower urinary tract symptoms: Secondary | ICD-10-CM

## 2015-01-28 ENCOUNTER — Encounter (INDEPENDENT_AMBULATORY_CARE_PROVIDER_SITE_OTHER): Payer: Medicare HMO | Admitting: Ophthalmology

## 2015-01-28 DIAGNOSIS — H2511 Age-related nuclear cataract, right eye: Secondary | ICD-10-CM | POA: Diagnosis not present

## 2015-01-28 DIAGNOSIS — D3131 Benign neoplasm of right choroid: Secondary | ICD-10-CM

## 2015-01-28 DIAGNOSIS — I10 Essential (primary) hypertension: Secondary | ICD-10-CM

## 2015-01-28 DIAGNOSIS — H43813 Vitreous degeneration, bilateral: Secondary | ICD-10-CM

## 2015-01-28 DIAGNOSIS — H34812 Central retinal vein occlusion, left eye: Secondary | ICD-10-CM | POA: Diagnosis not present

## 2015-01-28 DIAGNOSIS — H3531 Nonexudative age-related macular degeneration: Secondary | ICD-10-CM

## 2015-01-28 DIAGNOSIS — H35033 Hypertensive retinopathy, bilateral: Secondary | ICD-10-CM

## 2015-03-06 ENCOUNTER — Encounter (INDEPENDENT_AMBULATORY_CARE_PROVIDER_SITE_OTHER): Payer: Medicare HMO | Admitting: Ophthalmology

## 2015-03-06 DIAGNOSIS — H35033 Hypertensive retinopathy, bilateral: Secondary | ICD-10-CM

## 2015-03-06 DIAGNOSIS — H3531 Nonexudative age-related macular degeneration: Secondary | ICD-10-CM | POA: Diagnosis not present

## 2015-03-06 DIAGNOSIS — H43813 Vitreous degeneration, bilateral: Secondary | ICD-10-CM | POA: Diagnosis not present

## 2015-03-06 DIAGNOSIS — H34812 Central retinal vein occlusion, left eye: Secondary | ICD-10-CM | POA: Diagnosis not present

## 2015-03-06 DIAGNOSIS — D3131 Benign neoplasm of right choroid: Secondary | ICD-10-CM

## 2015-03-06 DIAGNOSIS — I10 Essential (primary) hypertension: Secondary | ICD-10-CM

## 2015-03-06 DIAGNOSIS — H2511 Age-related nuclear cataract, right eye: Secondary | ICD-10-CM

## 2015-04-12 ENCOUNTER — Encounter (INDEPENDENT_AMBULATORY_CARE_PROVIDER_SITE_OTHER): Payer: Medicare HMO | Admitting: Ophthalmology

## 2015-04-12 DIAGNOSIS — I10 Essential (primary) hypertension: Secondary | ICD-10-CM

## 2015-04-12 DIAGNOSIS — H34812 Central retinal vein occlusion, left eye: Secondary | ICD-10-CM | POA: Diagnosis not present

## 2015-04-12 DIAGNOSIS — D3131 Benign neoplasm of right choroid: Secondary | ICD-10-CM | POA: Diagnosis not present

## 2015-04-12 DIAGNOSIS — H35033 Hypertensive retinopathy, bilateral: Secondary | ICD-10-CM | POA: Diagnosis not present

## 2015-04-12 DIAGNOSIS — H3531 Nonexudative age-related macular degeneration: Secondary | ICD-10-CM | POA: Diagnosis not present

## 2015-04-12 DIAGNOSIS — H43813 Vitreous degeneration, bilateral: Secondary | ICD-10-CM

## 2015-05-24 ENCOUNTER — Encounter (INDEPENDENT_AMBULATORY_CARE_PROVIDER_SITE_OTHER): Payer: Medicare HMO | Admitting: Ophthalmology

## 2015-05-24 DIAGNOSIS — H43813 Vitreous degeneration, bilateral: Secondary | ICD-10-CM

## 2015-05-24 DIAGNOSIS — D3131 Benign neoplasm of right choroid: Secondary | ICD-10-CM

## 2015-05-24 DIAGNOSIS — H3531 Nonexudative age-related macular degeneration: Secondary | ICD-10-CM | POA: Diagnosis not present

## 2015-05-24 DIAGNOSIS — H34812 Central retinal vein occlusion, left eye: Secondary | ICD-10-CM | POA: Diagnosis not present

## 2015-05-24 DIAGNOSIS — H35033 Hypertensive retinopathy, bilateral: Secondary | ICD-10-CM

## 2015-05-24 DIAGNOSIS — I10 Essential (primary) hypertension: Secondary | ICD-10-CM

## 2015-06-28 ENCOUNTER — Encounter (INDEPENDENT_AMBULATORY_CARE_PROVIDER_SITE_OTHER): Payer: Medicare HMO | Admitting: Ophthalmology

## 2015-06-28 DIAGNOSIS — I10 Essential (primary) hypertension: Secondary | ICD-10-CM | POA: Diagnosis not present

## 2015-06-28 DIAGNOSIS — D3131 Benign neoplasm of right choroid: Secondary | ICD-10-CM | POA: Diagnosis not present

## 2015-06-28 DIAGNOSIS — H35033 Hypertensive retinopathy, bilateral: Secondary | ICD-10-CM

## 2015-06-28 DIAGNOSIS — H43813 Vitreous degeneration, bilateral: Secondary | ICD-10-CM

## 2015-06-28 DIAGNOSIS — H34812 Central retinal vein occlusion, left eye: Secondary | ICD-10-CM | POA: Diagnosis not present

## 2015-08-01 ENCOUNTER — Ambulatory Visit: Payer: Medicare HMO | Admitting: Cardiology

## 2015-08-05 ENCOUNTER — Other Ambulatory Visit: Payer: Self-pay

## 2015-08-05 ENCOUNTER — Ambulatory Visit (INDEPENDENT_AMBULATORY_CARE_PROVIDER_SITE_OTHER): Payer: Medicare HMO | Admitting: Cardiology

## 2015-08-05 ENCOUNTER — Encounter: Payer: Self-pay | Admitting: Cardiology

## 2015-08-05 VITALS — BP 138/70 | HR 54 | Ht 65.0 in | Wt 193.0 lb

## 2015-08-05 DIAGNOSIS — I2510 Atherosclerotic heart disease of native coronary artery without angina pectoris: Secondary | ICD-10-CM

## 2015-08-05 DIAGNOSIS — I10 Essential (primary) hypertension: Secondary | ICD-10-CM

## 2015-08-05 DIAGNOSIS — Z136 Encounter for screening for cardiovascular disorders: Secondary | ICD-10-CM

## 2015-08-05 DIAGNOSIS — I255 Ischemic cardiomyopathy: Secondary | ICD-10-CM | POA: Diagnosis not present

## 2015-08-05 MED ORDER — HYDROCHLOROTHIAZIDE 12.5 MG PO CAPS: 12.5000 mg | ORAL_CAPSULE | Freq: Every morning | ORAL | Status: DC

## 2015-08-05 MED ORDER — LISINOPRIL 30 MG PO TABS: 30.0000 mg | ORAL_TABLET | Freq: Every day | ORAL | Status: DC

## 2015-08-05 MED ORDER — NITROGLYCERIN 0.4 MG SL SUBL: 0.4000 mg | SUBLINGUAL_TABLET | SUBLINGUAL | Status: DC | PRN

## 2015-08-05 MED ORDER — PANTOPRAZOLE SODIUM 40 MG PO TBEC: 40.0000 mg | DELAYED_RELEASE_TABLET | Freq: Every day | ORAL | Status: DC

## 2015-08-05 MED ORDER — ATORVASTATIN CALCIUM 40 MG PO TABS: 40.0000 mg | ORAL_TABLET | Freq: Every day | ORAL | Status: DC

## 2015-08-05 MED ORDER — AMLODIPINE BESYLATE 10 MG PO TABS: 10.0000 mg | ORAL_TABLET | Freq: Every day | ORAL | Status: DC

## 2015-08-05 MED ORDER — CLOPIDOGREL BISULFATE 75 MG PO TABS: 75.0000 mg | ORAL_TABLET | Freq: Every day | ORAL | Status: DC

## 2015-08-05 MED ORDER — CARVEDILOL 12.5 MG PO TABS: 12.5000 mg | ORAL_TABLET | Freq: Two times a day (BID) | ORAL | Status: DC

## 2015-08-05 NOTE — Progress Notes (Signed)
Cardiology Office Note  Date: 08/05/2015   ID: MD SMOLA, DOB 1936/01/17, MRN [id number]  PCP: Sherrie Mustache, MD  Primary Cardiologist: Rozann Lesches, MD   Chief Complaint  Patient presents with  . Coronary Artery Disease  . Cardiomyopathy    History of Present Illness: [name] [name] is a 79 y.o. male last seen in February. He presents today with his wife for a follow-up visit. We have continued strategy of medical therapy and observation, he remains stable without active angina symptoms. We discussed refilling an old nitroglycerin bottle that he has currently.  Ischemic evaluation from 2013 is outlined below, at that time showing normalization of LVEF. He remains stable in terms of ADLs, limited by arthritic pains.  ECG today shows sinus bradycardia with PVC and incomplete left bundle branch block.  Past Medical History  Diagnosis Date  . Diverticulosis 2007    Colonoscopy  . Mixed hyperlipidemia   . Essential hypertension, benign   . Coronary atherosclerosis of native coronary artery     Multivessel and bypass graft disease  . Ischemic cardiomyopathy     LVEF 35%  . Fatty liver disease, nonalcoholic   . Chronic constipation   . Nephrolithiasis   . Hiatal hernia   . Schatzki's ring     Last EGD/ED Dr Paul Half  . GERD (gastroesophageal reflux disease)   . Renal insufficiency   . Myocardial infarction   . Headache(784.0)   . Arthritis     Past Surgical History  Procedure Laterality Date  . Coronary artery bypass graft  2002    LIMA to LAD, SVG to diagonal and ramus, SVG to PDA and PLA  . Nephrectomy      left  . Kidney stone surgery      right  . Knee arthroscopy      left  . Hemorrhoid surgery    . Total knee arthroplasty  08/01/2012    Procedure: TOTAL KNEE ARTHROPLASTY;  Surgeon: Tobi Bastos, MD;  Location: [facility];  Service: Orthopedics;  Laterality: Left;    Current Outpatient Prescriptions  Medication Sig Dispense Refill    . acetaminophen (TYLENOL) 500 MG tablet Take 1,000 mg by mouth every 6 (six) hours as needed for mild pain or moderate pain.    Marland Kitchen amLODipine (NORVASC) 10 MG tablet Take 1 tablet (10 mg total) by mouth at bedtime. 90 tablet 3  . atorvastatin (LIPITOR) 40 MG tablet Take 1 tablet (40 mg total) by mouth at bedtime. 90 tablet 3  . carvedilol (COREG) 12.5 MG tablet Take 1 tablet (12.5 mg total) by mouth 2 (two) times daily with a meal. 60 tablet 6  . clopidogrel (PLAVIX) 75 MG tablet Take 1 tablet (75 mg total) by mouth daily. 90 tablet 3  . dorzolamide-timolol (COSOPT) 22.3-6.8 MG/ML ophthalmic solution     . ergocalciferol (VITAMIN D2) 50000 UNITS capsule Take 50,000 Units by mouth every 30 (thirty) days.     . finasteride (PROSCAR) 5 MG tablet Take 5 mg by mouth daily.    . hydrochlorothiazide (MICROZIDE) 12.5 MG capsule Take 1 capsule (12.5 mg total) by mouth every morning. 90 capsule 3  . lisinopril (PRINIVIL,ZESTRIL) 30 MG tablet Take 1 tablet (30 mg total) by mouth daily. 90 tablet 3  . nitroGLYCERIN (NITROSTAT) 0.4 MG SL tablet Place 1 tablet (0.4 mg total) under the tongue every 5 (five) minutes as needed for chest pain. 25 tablet 3  . pantoprazole (PROTONIX) 40 MG tablet Take 1 tablet (40  mg total) by mouth daily. 90 tablet 3   No current facility-administered medications for this visit.    Allergies:  Review of patient's allergies indicates no known allergies.   Social History: The patient  reports that he quit smoking about 54 years ago. His smoking use included Cigarettes. He started smoking about 64 years ago. He has a 1 pack-year smoking history. His smokeless tobacco use includes Chew. He reports that he does not drink alcohol or use illicit drugs.   ROS:  Please see the history of present illness. Otherwise, complete review of systems is positive for none.  All other systems are reviewed and negative.   Physical Exam: VS:  BP 138/70 mmHg  Pulse 54  Ht 5\' 5"  (1.651 m)  Wt 193 lb  (87.544 kg)  BMI 32.12 kg/m2  SpO2 94%, BMI Body mass index is 32.12 kg/(m^2).  Wt Readings from Last 3 Encounters:  08/05/15 193 lb (87.544 kg)  01/07/15 195 lb (88.451 kg)  07/27/14 200 lb (90.719 kg)     Overweight male in no acute distress.  HEENT: Conjunctiva and lids normal, oropharynx with poor dentition.  Neck: Supple, no elevated JVP or bruits.  Lungs: Diminished, nonlabored.  Cardiac: Regular rate and rhythm, indistinct PMI, no S3.  Abdomen: Soft, nontender, bowel sounds present.  Skin: Warm and dry.  Extremities: 1+ edema below the knees, symmetrical. Distal pulses one plus.   ECG: ECG is ordered today.  Other Studies Reviewed Today:  Lexiscan Myoview done in August 2013 was overall low risk, equivocal ST segment changes, scar involving the apex and inferior wall without any large residual ischemia, LVEF 56% with inferior hypokinesis.  ASSESSMENT AND PLAN:  1. Multivessel CAD status post CABG in 2002, symptomatically stable. No changes made to current regimen. Ischemic workup 3 years ago was low risk.  2. History of ischemic cardiomyopathy with normalization of LVEF as of last evaluation.  3. Hyperlipidemia, on statin therapy.  Current medicines were reviewed at length with the patient today.   Orders Placed This Encounter  Procedures  . EKG 12-Lead    Disposition: FU with me in 6 months.   Signed, Satira Sark, MD, [facility] 08/05/2015 11:14 AM    [facility] at [facility] [address], [place], [place] [address] Phone: [phone]; Fax: [phone]

## 2015-08-05 NOTE — Patient Instructions (Signed)
Your physician wants you to follow-up in: 6 months Dr McDowell You will receive a reminder letter in the mail two months in advance. If you don't receive a letter, please call our office to schedule the follow-up appointment.    Your physician recommends that you continue on your current medications as directed. Please refer to the Current Medication list given to you today.     Thank you for choosing [facility] !        

## 2015-08-05 NOTE — Telephone Encounter (Signed)
Refills sent to [facility] 

## 2015-08-08 ENCOUNTER — Encounter (INDEPENDENT_AMBULATORY_CARE_PROVIDER_SITE_OTHER): Payer: Medicare HMO | Admitting: Ophthalmology

## 2015-08-08 DIAGNOSIS — H34812 Central retinal vein occlusion, left eye: Secondary | ICD-10-CM | POA: Diagnosis not present

## 2015-08-08 DIAGNOSIS — H3531 Nonexudative age-related macular degeneration: Secondary | ICD-10-CM

## 2015-08-08 DIAGNOSIS — D3131 Benign neoplasm of right choroid: Secondary | ICD-10-CM

## 2015-08-08 DIAGNOSIS — I10 Essential (primary) hypertension: Secondary | ICD-10-CM | POA: Diagnosis not present

## 2015-08-08 DIAGNOSIS — H35033 Hypertensive retinopathy, bilateral: Secondary | ICD-10-CM

## 2015-08-08 DIAGNOSIS — H43813 Vitreous degeneration, bilateral: Secondary | ICD-10-CM

## 2015-08-12 ENCOUNTER — Other Ambulatory Visit (HOSPITAL_COMMUNITY): Payer: Self-pay | Admitting: Adult Health Nurse Practitioner

## 2015-08-12 DIAGNOSIS — S0990XA Unspecified injury of head, initial encounter: Secondary | ICD-10-CM

## 2015-08-15 ENCOUNTER — Ambulatory Visit (HOSPITAL_COMMUNITY)
Admission: RE | Admit: 2015-08-15 | Discharge: 2015-08-15 | Disposition: A | Discharge status: Home / Self Care | Payer: Medicare HMO | Source: Ambulatory Visit | Attending: Adult Health Nurse Practitioner | Admitting: Adult Health Nurse Practitioner

## 2015-08-15 ENCOUNTER — Other Ambulatory Visit (HOSPITAL_COMMUNITY): Payer: Self-pay | Admitting: Adult Health Nurse Practitioner

## 2015-08-15 DIAGNOSIS — W19XXXA Unspecified fall, initial encounter: Secondary | ICD-10-CM | POA: Insufficient documentation

## 2015-08-15 DIAGNOSIS — S0990XA Unspecified injury of head, initial encounter: Secondary | ICD-10-CM

## 2015-08-15 DIAGNOSIS — S098XXA Other specified injuries of head, initial encounter: Secondary | ICD-10-CM | POA: Insufficient documentation

## 2015-08-15 DIAGNOSIS — Y939 Activity, unspecified: Secondary | ICD-10-CM | POA: Diagnosis not present

## 2015-08-15 DIAGNOSIS — R51 Headache: Secondary | ICD-10-CM | POA: Insufficient documentation

## 2015-08-15 LAB — POCT I-STAT CREATININE: Creatinine, Ser: 1.9 mg/dL — ABNORMAL HIGH (ref 0.61–1.24)

## 2015-09-19 ENCOUNTER — Encounter (INDEPENDENT_AMBULATORY_CARE_PROVIDER_SITE_OTHER): Payer: Medicare HMO | Admitting: Ophthalmology

## 2015-09-26 ENCOUNTER — Encounter (INDEPENDENT_AMBULATORY_CARE_PROVIDER_SITE_OTHER): Payer: Medicare HMO | Admitting: Ophthalmology

## 2015-09-26 DIAGNOSIS — H353132 Nonexudative age-related macular degeneration, bilateral, intermediate dry stage: Secondary | ICD-10-CM

## 2015-09-26 DIAGNOSIS — H35033 Hypertensive retinopathy, bilateral: Secondary | ICD-10-CM | POA: Diagnosis not present

## 2015-09-26 DIAGNOSIS — H34812 Central retinal vein occlusion, left eye, with macular edema: Secondary | ICD-10-CM | POA: Diagnosis not present

## 2015-09-26 DIAGNOSIS — D3131 Benign neoplasm of right choroid: Secondary | ICD-10-CM

## 2015-09-26 DIAGNOSIS — I10 Essential (primary) hypertension: Secondary | ICD-10-CM

## 2015-09-26 DIAGNOSIS — H43813 Vitreous degeneration, bilateral: Secondary | ICD-10-CM | POA: Diagnosis not present

## 2015-11-21 ENCOUNTER — Encounter (INDEPENDENT_AMBULATORY_CARE_PROVIDER_SITE_OTHER): Payer: Medicare HMO | Admitting: Ophthalmology

## 2015-11-27 ENCOUNTER — Encounter (INDEPENDENT_AMBULATORY_CARE_PROVIDER_SITE_OTHER): Payer: Medicare HMO | Admitting: Ophthalmology

## 2015-11-27 DIAGNOSIS — H43813 Vitreous degeneration, bilateral: Secondary | ICD-10-CM

## 2015-11-27 DIAGNOSIS — H34812 Central retinal vein occlusion, left eye, with macular edema: Secondary | ICD-10-CM | POA: Diagnosis not present

## 2015-11-27 DIAGNOSIS — I10 Essential (primary) hypertension: Secondary | ICD-10-CM

## 2015-11-27 DIAGNOSIS — H353132 Nonexudative age-related macular degeneration, bilateral, intermediate dry stage: Secondary | ICD-10-CM | POA: Diagnosis not present

## 2015-11-27 DIAGNOSIS — H35033 Hypertensive retinopathy, bilateral: Secondary | ICD-10-CM

## 2015-11-27 DIAGNOSIS — D3131 Benign neoplasm of right choroid: Secondary | ICD-10-CM | POA: Diagnosis not present

## 2015-11-27 DIAGNOSIS — H2511 Age-related nuclear cataract, right eye: Secondary | ICD-10-CM | POA: Diagnosis not present

## 2015-12-24 ENCOUNTER — Encounter (INDEPENDENT_AMBULATORY_CARE_PROVIDER_SITE_OTHER): Payer: Medicare HMO | Admitting: Ophthalmology

## 2015-12-24 DIAGNOSIS — H43813 Vitreous degeneration, bilateral: Secondary | ICD-10-CM

## 2015-12-24 DIAGNOSIS — H35033 Hypertensive retinopathy, bilateral: Secondary | ICD-10-CM

## 2015-12-24 DIAGNOSIS — D3132 Benign neoplasm of left choroid: Secondary | ICD-10-CM | POA: Diagnosis not present

## 2015-12-24 DIAGNOSIS — D3131 Benign neoplasm of right choroid: Secondary | ICD-10-CM | POA: Diagnosis not present

## 2015-12-24 DIAGNOSIS — I10 Essential (primary) hypertension: Secondary | ICD-10-CM | POA: Diagnosis not present

## 2015-12-24 DIAGNOSIS — H353112 Nonexudative age-related macular degeneration, right eye, intermediate dry stage: Secondary | ICD-10-CM

## 2015-12-24 DIAGNOSIS — H34812 Central retinal vein occlusion, left eye, with macular edema: Secondary | ICD-10-CM | POA: Diagnosis not present

## 2016-01-21 ENCOUNTER — Ambulatory Visit (INDEPENDENT_AMBULATORY_CARE_PROVIDER_SITE_OTHER): Payer: Medicare HMO | Admitting: Urology

## 2016-01-21 DIAGNOSIS — N390 Urinary tract infection, site not specified: Secondary | ICD-10-CM

## 2016-01-21 DIAGNOSIS — N401 Enlarged prostate with lower urinary tract symptoms: Secondary | ICD-10-CM

## 2016-01-21 DIAGNOSIS — R972 Elevated prostate specific antigen [PSA]: Secondary | ICD-10-CM | POA: Diagnosis not present

## 2016-01-29 ENCOUNTER — Encounter (INDEPENDENT_AMBULATORY_CARE_PROVIDER_SITE_OTHER): Payer: Medicare HMO | Admitting: Ophthalmology

## 2016-01-29 DIAGNOSIS — D3131 Benign neoplasm of right choroid: Secondary | ICD-10-CM | POA: Diagnosis not present

## 2016-01-29 DIAGNOSIS — H353112 Nonexudative age-related macular degeneration, right eye, intermediate dry stage: Secondary | ICD-10-CM | POA: Diagnosis not present

## 2016-01-29 DIAGNOSIS — H34812 Central retinal vein occlusion, left eye, with macular edema: Secondary | ICD-10-CM | POA: Diagnosis not present

## 2016-01-29 DIAGNOSIS — I10 Essential (primary) hypertension: Secondary | ICD-10-CM

## 2016-01-29 DIAGNOSIS — H35033 Hypertensive retinopathy, bilateral: Secondary | ICD-10-CM

## 2016-02-03 ENCOUNTER — Encounter: Payer: Self-pay | Admitting: Internal Medicine

## 2016-02-06 ENCOUNTER — Encounter: Payer: Self-pay | Admitting: Cardiology

## 2016-02-06 ENCOUNTER — Ambulatory Visit (INDEPENDENT_AMBULATORY_CARE_PROVIDER_SITE_OTHER): Payer: Medicare HMO | Admitting: Cardiology

## 2016-02-06 VITALS — BP 112/62 | HR 58 | Ht 64.0 in | Wt 193.0 lb

## 2016-02-06 DIAGNOSIS — I10 Essential (primary) hypertension: Secondary | ICD-10-CM | POA: Diagnosis not present

## 2016-02-06 DIAGNOSIS — R531 Weakness: Secondary | ICD-10-CM | POA: Diagnosis not present

## 2016-02-06 DIAGNOSIS — I779 Disorder of arteries and arterioles, unspecified: Secondary | ICD-10-CM

## 2016-02-06 DIAGNOSIS — I255 Ischemic cardiomyopathy: Secondary | ICD-10-CM

## 2016-02-06 DIAGNOSIS — E785 Hyperlipidemia, unspecified: Secondary | ICD-10-CM

## 2016-02-06 DIAGNOSIS — I2510 Atherosclerotic heart disease of native coronary artery without angina pectoris: Secondary | ICD-10-CM

## 2016-02-06 DIAGNOSIS — I739 Peripheral vascular disease, unspecified: Secondary | ICD-10-CM

## 2016-02-06 MED ORDER — ATORVASTATIN CALCIUM 20 MG PO TABS: 20.0000 mg | ORAL_TABLET | Freq: Every day | ORAL | Status: DC

## 2016-02-06 NOTE — Patient Instructions (Addendum)
Your physician wants you to follow-up in: 6 months with Dr Ferne Reus will receive a reminder letter in the mail two months in advance. If you don't receive a letter, please call our office to schedule the follow-up appointment.    DECREASE Atorvastatin to 20 mg daily  If you need a refill on your cardiac medications before your next appointment, please call your pharmacy.    Thank you for choosing [facility] !

## 2016-02-06 NOTE — Progress Notes (Signed)
Cardiology Office Note  Date: 02/06/2016   ID: [name] [name], DOB 15-Jun-1936, MRN [id number]  PCP: Sherrie Mustache, MD  Primary Cardiologist: Rozann Lesches, MD   Chief Complaint  Patient presents with  . Coronary Artery Disease  . Cardiomyopathy    History of Present Illness: [name] [name] is a 79 y.o. male last seen in September 2016. He presents for a routine follow-up visit. He states that he has been weak over the last several months. No chest pain but just feels tired and a lack of energy. He does tell me that he had inadvertently been taking lisinopril at 30 mg twice daily for a period of time, stopped a few weeks ago and is starting to feel somewhat better. He also reports chronic arthritic pains and joint soreness.  I reviewed his medications. Current cardiac regimen includes Norvasc, Lipitor, Coreg, Plavix, HCTZ, lisinopril, and as needed nitroglycerin.  His last cardiac ischemic evaluation was in 2013. We have been managing him medically since that time. Myoview result reported below. He has mild carotid artery disease.  I reviewed his most recent lab work, last LDL was 58 in October 2016.  Past Medical History  Diagnosis Date  . Diverticulosis 2007    Colonoscopy  . Mixed hyperlipidemia   . Essential hypertension, benign   . Coronary atherosclerosis of native coronary artery     Multivessel and bypass graft disease  . Ischemic cardiomyopathy     LVEF 35%  . Fatty liver disease, nonalcoholic   . Chronic constipation   . Nephrolithiasis   . Hiatal hernia   . Schatzki's ring     Last EGD/ED Dr Paul Half  . GERD (gastroesophageal reflux disease)   . Renal insufficiency   . Myocardial infarction ([place])   . Headache(784.0)   . Arthritis     Past Surgical History  Procedure Laterality Date  . Coronary artery bypass graft  2002    LIMA to LAD, SVG to diagonal and ramus, SVG to PDA and PLA  . Nephrectomy      left  . Kidney stone surgery     right  . Knee arthroscopy      left  . Hemorrhoid surgery    . Total knee arthroplasty  08/01/2012    Procedure: TOTAL KNEE ARTHROPLASTY;  Surgeon: Tobi Bastos, MD;  Location: [facility];  Service: Orthopedics;  Laterality: Left;    Current Outpatient Prescriptions  Medication Sig Dispense Refill  . acetaminophen (TYLENOL) 500 MG tablet Take 1,000 mg by mouth every 6 (six) hours as needed for mild pain or moderate pain.    Marland Kitchen amLODipine (NORVASC) 10 MG tablet Take 1 tablet (10 mg total) by mouth at bedtime. 90 tablet 3  . carvedilol (COREG) 12.5 MG tablet Take 1 tablet (12.5 mg total) by mouth 2 (two) times daily with a meal. 180 tablet 6  . clopidogrel (PLAVIX) 75 MG tablet Take 1 tablet (75 mg total) by mouth daily. 90 tablet 3  . dorzolamide-timolol (COSOPT) 22.3-6.8 MG/ML ophthalmic solution     . finasteride (PROSCAR) 5 MG tablet Take 5 mg by mouth daily.    . hydrochlorothiazide (MICROZIDE) 12.5 MG capsule Take 1 capsule (12.5 mg total) by mouth every morning. 90 capsule 3  . lisinopril (PRINIVIL,ZESTRIL) 30 MG tablet Take 1 tablet (30 mg total) by mouth daily. 90 tablet 3  . nitroGLYCERIN (NITROSTAT) 0.4 MG SL tablet Place 1 tablet (0.4 mg total) under the tongue every 5 (five) minutes as needed  for chest pain. 25 tablet 3  . pantoprazole (PROTONIX) 40 MG tablet Take 1 tablet (40 mg total) by mouth daily. 90 tablet 3  . atorvastatin (LIPITOR) 20 MG tablet Take 1 tablet (20 mg total) by mouth daily. 90 tablet 3   No current facility-administered medications for this visit.   Allergies:  Review of patient's allergies indicates no known allergies.   Social History: The patient  reports that he quit smoking about 54 years ago. His smoking use included Cigarettes. He started smoking about 65 years ago. He has a 1 pack-year smoking history. His smokeless tobacco use includes Chew. He reports that he does not drink alcohol or use illicit drugs.   ROS:  Please see the history of present  illness. Otherwise, complete review of systems is positive for fatigue.  All other systems are reviewed and negative.   Physical Exam: VS:  BP 112/62 mmHg  Pulse 58  Ht 5\' 4"  (1.626 m)  Wt 193 lb (87.544 kg)  BMI 33.11 kg/m2  SpO2 97%, BMI Body mass index is 33.11 kg/(m^2).  Wt Readings from Last 3 Encounters:  02/06/16 193 lb (87.544 kg)  08/05/15 193 lb (87.544 kg)  01/07/15 195 lb (88.451 kg)    Overweight, chronically ill-appearing male in no acute distress.  HEENT: Conjunctiva and lids normal, oropharynx with poor dentition.  Neck: Supple, no elevated JVP or bruits.  Lungs: Diminished, nonlabored.  Cardiac: Regular rate and rhythm, indistinct PMI, no S3.  Abdomen: Soft, nontender, bowel sounds present.  Skin: Warm and dry.  Extremities: 1+ edema below the knees, symmetrical. Distal pulses one plus.   ECG: I personally reviewed the prior tracing from 08/05/2015 which showed sinus rhythm with left branch block and PVC.  Recent Labwork: October 2016: BUN 14, creatinine 1.6, potassium 4.3, AST 14, ALT 11, cholesterol 127, triglycerides 133, HDL 42, LDL 58  Other Studies Reviewed Today:  Lexiscan Myoview August 2013: Overall low risk, equivocal ST segment changes, scar involving the apex and inferior wall without any large residual ischemia, LVEF 56% with inferior hypokinesis.  Carotid Dopplers 07/08/2014: Summary:  - The vertebral arteries appear patent with antegrade flow. - Findings consistent with 1- 39 percent stenosis involving the right internal carotid artery and the left internal carotid artery. - ICA/CCA ratio. right = 0.68. left = 1.44.  Assessment and Plan:  1. Multivessel CAD status post CABG in 2002. He had a low risk ischemic evaluation in August 2013, no active angina symptoms this time. We will plan to continue medical therapy.  2. Recent weakness and muscle soreness. He was inadvertently taking too high a dose of lisinopril which could have  been contributing, starting to feel somewhat better now. He will continue lisinopril 30 mg daily. I will also cut his Lipitor down to 20 mg daily to see if this might help some of his soreness.  3. Hyperlipidemia, on Lipitor. Will cut back to 20 g daily. LDL 58.  4. Essential hypertension, blood pressure is well controlled today.  5. Mild carotid artery disease by Dopplers in 2015. Will obtain a follow-up study within the next year.  6. History of cardiomyopathy with normalization of LVEF on medical therapy.  Current medicines were reviewed with the patient today.  Disposition: FU with me in 6 months.   Signed, Satira Sark, MD, [facility] 02/06/2016 3:07 PM    [facility] at [facility] [address], [place], [place] [address] Phone: [phone]; Fax: [phone]

## 2016-03-04 ENCOUNTER — Encounter (INDEPENDENT_AMBULATORY_CARE_PROVIDER_SITE_OTHER): Payer: Medicare HMO | Admitting: Ophthalmology

## 2016-03-04 DIAGNOSIS — H43813 Vitreous degeneration, bilateral: Secondary | ICD-10-CM

## 2016-03-04 DIAGNOSIS — H353132 Nonexudative age-related macular degeneration, bilateral, intermediate dry stage: Secondary | ICD-10-CM

## 2016-03-04 DIAGNOSIS — H35033 Hypertensive retinopathy, bilateral: Secondary | ICD-10-CM | POA: Diagnosis not present

## 2016-03-04 DIAGNOSIS — H34812 Central retinal vein occlusion, left eye, with macular edema: Secondary | ICD-10-CM | POA: Diagnosis not present

## 2016-03-04 DIAGNOSIS — D3131 Benign neoplasm of right choroid: Secondary | ICD-10-CM | POA: Diagnosis not present

## 2016-03-04 DIAGNOSIS — I10 Essential (primary) hypertension: Secondary | ICD-10-CM

## 2016-04-15 ENCOUNTER — Encounter (INDEPENDENT_AMBULATORY_CARE_PROVIDER_SITE_OTHER): Payer: Medicare HMO | Admitting: Ophthalmology

## 2016-04-15 DIAGNOSIS — H43813 Vitreous degeneration, bilateral: Secondary | ICD-10-CM

## 2016-04-15 DIAGNOSIS — H353121 Nonexudative age-related macular degeneration, left eye, early dry stage: Secondary | ICD-10-CM | POA: Diagnosis not present

## 2016-04-15 DIAGNOSIS — I10 Essential (primary) hypertension: Secondary | ICD-10-CM

## 2016-04-15 DIAGNOSIS — D3131 Benign neoplasm of right choroid: Secondary | ICD-10-CM | POA: Diagnosis not present

## 2016-04-15 DIAGNOSIS — H35033 Hypertensive retinopathy, bilateral: Secondary | ICD-10-CM

## 2016-04-15 DIAGNOSIS — H34812 Central retinal vein occlusion, left eye, with macular edema: Secondary | ICD-10-CM

## 2016-05-05 DIAGNOSIS — N400 Enlarged prostate without lower urinary tract symptoms: Secondary | ICD-10-CM | POA: Insufficient documentation

## 2016-05-05 DIAGNOSIS — N182 Chronic kidney disease, stage 2 (mild): Secondary | ICD-10-CM | POA: Insufficient documentation

## 2016-05-05 DIAGNOSIS — E785 Hyperlipidemia, unspecified: Secondary | ICD-10-CM | POA: Insufficient documentation

## 2016-05-27 ENCOUNTER — Encounter (INDEPENDENT_AMBULATORY_CARE_PROVIDER_SITE_OTHER): Payer: Medicare HMO | Admitting: Ophthalmology

## 2016-05-27 DIAGNOSIS — H34832 Tributary (branch) retinal vein occlusion, left eye, with macular edema: Secondary | ICD-10-CM

## 2016-05-27 DIAGNOSIS — H35033 Hypertensive retinopathy, bilateral: Secondary | ICD-10-CM | POA: Diagnosis not present

## 2016-05-27 DIAGNOSIS — H353132 Nonexudative age-related macular degeneration, bilateral, intermediate dry stage: Secondary | ICD-10-CM | POA: Diagnosis not present

## 2016-05-27 DIAGNOSIS — H43813 Vitreous degeneration, bilateral: Secondary | ICD-10-CM

## 2016-05-27 DIAGNOSIS — I10 Essential (primary) hypertension: Secondary | ICD-10-CM | POA: Diagnosis not present

## 2016-07-17 ENCOUNTER — Encounter (INDEPENDENT_AMBULATORY_CARE_PROVIDER_SITE_OTHER): Payer: Medicare HMO | Admitting: Ophthalmology

## 2016-07-17 DIAGNOSIS — H353123 Nonexudative age-related macular degeneration, left eye, advanced atrophic without subfoveal involvement: Secondary | ICD-10-CM | POA: Diagnosis not present

## 2016-07-17 DIAGNOSIS — H35033 Hypertensive retinopathy, bilateral: Secondary | ICD-10-CM | POA: Diagnosis not present

## 2016-07-17 DIAGNOSIS — H34812 Central retinal vein occlusion, left eye, with macular edema: Secondary | ICD-10-CM

## 2016-07-17 DIAGNOSIS — H43813 Vitreous degeneration, bilateral: Secondary | ICD-10-CM

## 2016-07-17 DIAGNOSIS — I10 Essential (primary) hypertension: Secondary | ICD-10-CM

## 2016-08-05 ENCOUNTER — Ambulatory Visit (INDEPENDENT_AMBULATORY_CARE_PROVIDER_SITE_OTHER): Payer: Medicare HMO | Admitting: Cardiology

## 2016-08-05 ENCOUNTER — Encounter: Payer: Self-pay | Admitting: Cardiology

## 2016-08-05 VITALS — BP 118/62 | HR 57 | Ht 64.0 in | Wt 193.0 lb

## 2016-08-05 DIAGNOSIS — E785 Hyperlipidemia, unspecified: Secondary | ICD-10-CM | POA: Diagnosis not present

## 2016-08-05 DIAGNOSIS — I10 Essential (primary) hypertension: Secondary | ICD-10-CM | POA: Diagnosis not present

## 2016-08-05 DIAGNOSIS — I2510 Atherosclerotic heart disease of native coronary artery without angina pectoris: Secondary | ICD-10-CM | POA: Diagnosis not present

## 2016-08-05 DIAGNOSIS — Z8679 Personal history of other diseases of the circulatory system: Secondary | ICD-10-CM | POA: Diagnosis not present

## 2016-08-05 NOTE — Progress Notes (Signed)
Cardiology Office Note  Date: 08/05/2016   ID: [name] [name], DOB 11/17/1935, MRN [id number]  PCP: Sherrie Mustache, MD  Primary Cardiologist: Rozann Lesches, MD   Chief Complaint  Patient presents with  . Coronary Artery Disease    History of Present Illness: [name] [name] is a 80 y.o. male last seen in March. He presents for a routine follow-up visit. He does not endorse any angina symptoms on medical therapy or nitroglycerin use. His wife states that he has slowed down somewhat over the last year.  We went over his medications today. He reports no intolerances. ECG shows sinus bradycardia with IVCD and repolarization abnormalities.  I reviewed his most recent lab work per Dr. Edrick Oh as outlined below. Lipids have been well controlled.  Past Medical History:  Diagnosis Date  . Arthritis   . Chronic constipation   . Coronary atherosclerosis of native coronary artery    Multivessel and bypass graft disease  . Diverticulosis 2007   Colonoscopy  . Essential hypertension, benign   . Fatty liver disease, nonalcoholic   . GERD (gastroesophageal reflux disease)   . Headache(784.0)   . Hiatal hernia   . Ischemic cardiomyopathy    LVEF 35%  . Mixed hyperlipidemia   . Myocardial infarction ([place])   . Nephrolithiasis   . Renal insufficiency   . Schatzki's ring    Last EGD/ED Dr Paul Half    Past Surgical History:  Procedure Laterality Date  . CORONARY ARTERY BYPASS GRAFT  2002   LIMA to LAD, SVG to diagonal and ramus, SVG to PDA and PLA  . HEMORRHOID SURGERY    . KIDNEY STONE SURGERY     right  . KNEE ARTHROSCOPY     left  . NEPHRECTOMY     left  . TOTAL KNEE ARTHROPLASTY  08/01/2012   Procedure: TOTAL KNEE ARTHROPLASTY;  Surgeon: Tobi Bastos, MD;  Location: [facility];  Service: Orthopedics;  Laterality: Left;    Current Outpatient Prescriptions  Medication Sig Dispense Refill  . acetaminophen (TYLENOL) 500 MG tablet Take 1,000 mg by mouth every 6  (six) hours as needed for mild pain or moderate pain.    Marland Kitchen amLODipine (NORVASC) 10 MG tablet Take 1 tablet (10 mg total) by mouth at bedtime. 90 tablet 3  . atorvastatin (LIPITOR) 20 MG tablet Take 1 tablet (20 mg total) by mouth daily. 90 tablet 3  . carvedilol (COREG) 12.5 MG tablet Take 1 tablet (12.5 mg total) by mouth 2 (two) times daily with a meal. 180 tablet 6  . clopidogrel (PLAVIX) 75 MG tablet Take 1 tablet (75 mg total) by mouth daily. 90 tablet 3  . dorzolamide-timolol (COSOPT) 22.3-6.8 MG/ML ophthalmic solution     . finasteride (PROSCAR) 5 MG tablet Take 5 mg by mouth daily.    . hydrochlorothiazide (MICROZIDE) 12.5 MG capsule Take 1 capsule (12.5 mg total) by mouth every morning. 90 capsule 3  . lisinopril (PRINIVIL,ZESTRIL) 30 MG tablet Take 1 tablet (30 mg total) by mouth daily. 90 tablet 3  . nitroGLYCERIN (NITROSTAT) 0.4 MG SL tablet Place 1 tablet (0.4 mg total) under the tongue every 5 (five) minutes as needed for chest pain. 25 tablet 3  . pantoprazole (PROTONIX) 40 MG tablet Take 1 tablet (40 mg total) by mouth daily. 90 tablet 3   No current facility-administered medications for this visit.    Allergies:  Amoxicillin   Social History: The patient  reports that he quit smoking about 55 years  ago. His smoking use included Cigarettes. He started smoking about 65 years ago. He has a 1.00 pack-year smoking history. His smokeless tobacco use includes Chew. He reports that he does not drink alcohol or use drugs.   Family History: The patient's family history includes Coronary artery disease in his father.   ROS:  Please see the history of present illness. Otherwise, complete review of systems is positive for some unsteadiness with gait.  All other systems are reviewed and negative.   Physical Exam: VS:  BP 118/62   Pulse (!) 57   Ht 5\' 4"  (1.626 m)   Wt 193 lb (87.5 kg)   SpO2 96%   BMI 33.13 kg/m , BMI Body mass index is 33.13 kg/m.  Wt Readings from Last 3  Encounters:  08/05/16 193 lb (87.5 kg)  02/06/16 193 lb (87.5 kg)  08/05/15 193 lb (87.5 kg)    Overweight, chronically ill-appearing male in no acute distress.  HEENT: Conjunctiva and lids normal, oropharynx with poor dentition.  Neck: Supple, no elevated JVP or bruits.  Lungs: Diminished, nonlabored.  Cardiac: Regular rate and rhythm, indistinct PMI, no S3.  Abdomen: Soft, nontender, bowel sounds present.  Skin: Warm and dry.  Extremities: 1+ edema below the knees, symmetrical. Distal pulses one plus.  ECG: I personally reviewed the tracing from 08/05/2015 which showed sinus rhythm with left branch block and PVC.  Recent Labwork:  June 2017: BUN 16, creatinine 1.7, potassium 4.4, AST 12, ALT 6, hemoglobin 14.6, platelets 183, cholesterol 119, triglycerides 111, HDL 42, LDL 55  Other Studies Reviewed Today:  Lexiscan Myoview August 2013: Overall low risk, equivocal ST segment changes, scar involving the apex and inferior wall without any large residual ischemia, LVEF 56% with inferior hypokinesis.  Assessment and Plan:  1. Multivessel CAD status post CABG in 2002. He does not report any angina symptoms or nitroglycerin use. Ischemic testing in 2013 was overall low risk. We will continue with observation for now.  2. Hyperlipidemia, continues on Lipitor with recent LDL 55.  3. Essential hypertension, blood pressure is adequately controlled today.  4. History of cardiomyopathy with subsequent normalization of LVEF.  Current medicines were reviewed with the patient today.  Disposition: Follow-up with me in 6 months.  Signed, Satira Sark, MD, [facility] 08/05/2016 4:04 PM    [facility] at [facility] [address] [place], [place] [address] Phone: [phone]; Fax: [phone]

## 2016-08-05 NOTE — Patient Instructions (Signed)
Your physician wants you to follow-up in: 6 Months with Dr. McDowell.  You will receive a reminder letter in the mail two months in advance. If you don't receive a letter, please call our office to schedule the follow-up appointment.  Your physician recommends that you continue on your current medications as directed. Please refer to the Current Medication list given to you today.  If you need a refill on your cardiac medications before your next appointment, please call your pharmacy.  Thank you for choosing [facility]! '  

## 2016-08-06 ENCOUNTER — Other Ambulatory Visit: Payer: Self-pay | Admitting: Cardiology

## 2016-08-06 NOTE — Addendum Note (Signed)
Addended by: Debbora Lacrosse R on: 08/06/2016 04:13 PM   Modules accepted: Orders

## 2016-08-20 ENCOUNTER — Other Ambulatory Visit: Payer: Self-pay | Admitting: Cardiology

## 2016-08-21 ENCOUNTER — Encounter (INDEPENDENT_AMBULATORY_CARE_PROVIDER_SITE_OTHER): Payer: Medicare HMO | Admitting: Ophthalmology

## 2016-08-21 DIAGNOSIS — I10 Essential (primary) hypertension: Secondary | ICD-10-CM | POA: Diagnosis not present

## 2016-08-21 DIAGNOSIS — D3131 Benign neoplasm of right choroid: Secondary | ICD-10-CM | POA: Diagnosis not present

## 2016-08-21 DIAGNOSIS — H43813 Vitreous degeneration, bilateral: Secondary | ICD-10-CM | POA: Diagnosis not present

## 2016-08-21 DIAGNOSIS — H34812 Central retinal vein occlusion, left eye, with macular edema: Secondary | ICD-10-CM | POA: Diagnosis not present

## 2016-08-21 DIAGNOSIS — H35033 Hypertensive retinopathy, bilateral: Secondary | ICD-10-CM

## 2016-08-21 DIAGNOSIS — H353132 Nonexudative age-related macular degeneration, bilateral, intermediate dry stage: Secondary | ICD-10-CM

## 2016-08-31 ENCOUNTER — Telehealth: Payer: Self-pay | Admitting: Cardiology

## 2016-08-31 NOTE — Telephone Encounter (Signed)
Patient states that he is no longer taking Hydrochloratiazide but received it from [place]. Has questions regarding whether he is to take it or not. / tg

## 2016-08-31 NOTE — Telephone Encounter (Signed)
Will forward to Dr Renato Shin don't see where we stopped HCTZ

## 2016-09-01 NOTE — Telephone Encounter (Signed)
Not entirely clear based on reviewing the recent notes. If he has not been taking HCTZ, would stay off of it for now. His last blood pressure was normal.

## 2016-09-01 NOTE — Telephone Encounter (Signed)
Stopped by pcp dr Edrick Oh,

## 2016-09-16 DIAGNOSIS — F17220 Nicotine dependence, chewing tobacco, uncomplicated: Secondary | ICD-10-CM | POA: Diagnosis not present

## 2016-09-16 DIAGNOSIS — I10 Essential (primary) hypertension: Secondary | ICD-10-CM | POA: Insufficient documentation

## 2016-09-16 DIAGNOSIS — I2510 Atherosclerotic heart disease of native coronary artery without angina pectoris: Secondary | ICD-10-CM | POA: Insufficient documentation

## 2016-09-16 DIAGNOSIS — L03311 Cellulitis of abdominal wall: Secondary | ICD-10-CM | POA: Insufficient documentation

## 2016-09-16 DIAGNOSIS — Z79899 Other long term (current) drug therapy: Secondary | ICD-10-CM | POA: Insufficient documentation

## 2016-09-16 DIAGNOSIS — Z951 Presence of aortocoronary bypass graft: Secondary | ICD-10-CM | POA: Diagnosis not present

## 2016-09-16 DIAGNOSIS — R1909 Other intra-abdominal and pelvic swelling, mass and lump: Secondary | ICD-10-CM | POA: Diagnosis present

## 2016-09-17 ENCOUNTER — Emergency Department (HOSPITAL_COMMUNITY)
Admission: EM | Admit: 2016-09-17 | Discharge: 2016-09-17 | Disposition: A | Discharge status: Home / Self Care | Payer: Medicare HMO | Attending: Emergency Medicine | Admitting: Emergency Medicine

## 2016-09-17 ENCOUNTER — Encounter (HOSPITAL_COMMUNITY): Payer: Self-pay

## 2016-09-17 DIAGNOSIS — L03311 Cellulitis of abdominal wall: Secondary | ICD-10-CM

## 2016-09-17 MED ORDER — SULFAMETHOXAZOLE-TRIMETHOPRIM 800-160 MG PO TABS: 1.0000 | ORAL_TABLET | Freq: Two times a day (BID) | ORAL | 0 refills | Status: AC

## 2016-09-17 MED ORDER — MUPIROCIN CALCIUM 2 % EX CREA: 1.0000 "application " | TOPICAL_CREAM | Freq: Two times a day (BID) | CUTANEOUS | 1 refills | Status: DC

## 2016-09-17 NOTE — ED Provider Notes (Signed)
[facility] DEPT Provider Note   CSN: [id number] Arrival date & time: 09/16/16  2359     History   Chief Complaint Chief Complaint  Patient presents with  . Abscess    HPI [name] [name] is a 80 y.o. male.  [name] [name] is a 80 y.o. Male who presents to the ED complaining of an area of pain and swelling to his groin since today. The patient reports he got up to use the bathroom and noticed a small area that was draining to his suprapubic area. He reports only minimal pain. He denies injury or trauma to the area. No fevers. No treatments prior to arrival. He denies fevers, abdominal pain, nausea, vomiting, urinary symptoms, other rashes, penile pain or testicular pain.   The history is provided by the patient. No language interpreter was used.  Abscess  Associated symptoms: no fever, no nausea and no vomiting     Past Medical History:  Diagnosis Date  . Arthritis   . Chronic constipation   . Coronary atherosclerosis of native coronary artery    Multivessel and bypass graft disease  . Diverticulosis 2007   Colonoscopy  . Essential hypertension, benign   . Fatty liver disease, nonalcoholic   . GERD (gastroesophageal reflux disease)   . Headache(784.0)   . Hiatal hernia   . Ischemic cardiomyopathy    LVEF 35%  . Mixed hyperlipidemia   . Myocardial infarction   . Nephrolithiasis   . Renal insufficiency   . Schatzki's ring    Last EGD/ED Dr Paul Half    Patient Active Problem List   Diagnosis Date Noted  . S/P total knee arthroplasty 08/02/2012  . Essential hypertension, benign 06/04/2010  . CORONARY ATHEROSCLEROSIS NATIVE CORONARY ARTERY 06/04/2010  . CARDIOMYOPATHY, ISCHEMIC 07/26/2009  . HYPERCHOLESTEROLEMIA 02/18/2009  . TOBACCO USER 02/18/2009  . Esophageal reflux 02/18/2009    Past Surgical History:  Procedure Laterality Date  . CORONARY ARTERY BYPASS GRAFT  2002   LIMA to LAD, SVG to diagonal and ramus, SVG to PDA and PLA  . HEMORRHOID  SURGERY    . KIDNEY STONE SURGERY     right  . KNEE ARTHROSCOPY     left  . NEPHRECTOMY     left  . TOTAL KNEE ARTHROPLASTY  08/01/2012   Procedure: TOTAL KNEE ARTHROPLASTY;  Surgeon: Tobi Bastos, MD;  Location: [facility];  Service: Orthopedics;  Laterality: Left;       Home Medications    Prior to Admission medications   Medication Sig Start Date End Date Taking? Authorizing Provider  acetaminophen (TYLENOL) 500 MG tablet Take 1,000 mg by mouth every 6 (six) hours as needed for mild pain or moderate pain.   Yes Historical Provider, MD  amLODipine (NORVASC) 10 MG tablet TAKE 1 TABLET AT BEDTIME 08/06/16  Yes Satira Sark, MD  atorvastatin (LIPITOR) 20 MG tablet Take 1 tablet (20 mg total) by mouth daily. 02/06/16  Yes Satira Sark, MD  carvedilol (COREG) 12.5 MG tablet Take 1 tablet (12.5 mg total) by mouth 2 (two) times daily with a meal. 08/05/15  Yes Satira Sark, MD  clopidogrel (PLAVIX) 75 MG tablet TAKE 1 TABLET EVERY DAY 08/06/16  Yes Satira Sark, MD  finasteride (PROSCAR) 5 MG tablet Take 5 mg by mouth daily.   Yes Historical Provider, MD  lisinopril (PRINIVIL,ZESTRIL) 30 MG tablet TAKE 1 TABLET EVERY DAY 08/20/16  Yes Satira Sark, MD  nitroGLYCERIN (NITROSTAT) 0.4 MG SL tablet Place 1  tablet (0.4 mg total) under the tongue every 5 (five) minutes as needed for chest pain. 08/05/15  Yes Satira Sark, MD  pantoprazole (PROTONIX) 40 MG tablet TAKE 1 TABLET EVERY DAY 08/20/16  Yes Satira Sark, MD  dorzolamide-timolol (COSOPT) 22.3-6.8 MG/ML ophthalmic solution  05/28/15   Historical Provider, MD  mupirocin cream (BACTROBAN) 2 % Apply 1 application topically 2 (two) times daily. 09/17/16   Waynetta Pean, PA-C  sulfamethoxazole-trimethoprim (BACTRIM DS,SEPTRA DS) 800-160 MG tablet Take 1 tablet by mouth 2 (two) times daily. 09/17/16 09/24/16  Waynetta Pean, PA-C    Family History Family History  Problem Relation Age of Onset  . Coronary artery  disease Father     Social History Social History  Substance Use Topics  . Smoking status: Former Smoker    Packs/day: 0.50    Years: 2.00    Types: Cigarettes    Start date: 01/07/1951    Quit date: 05/20/1961  . Smokeless tobacco: Current User    Types: Chew     Comment: quit 50 yrs ago  . Alcohol use No     Allergies   Amoxicillin   Review of Systems Review of Systems  Constitutional: Negative for fever.  Gastrointestinal: Negative for abdominal pain, diarrhea, nausea and vomiting.  Genitourinary: Negative for decreased urine volume, difficulty urinating, dysuria, frequency, hematuria, penile pain and testicular pain.  Skin: Positive for rash.     Physical Exam Updated Vital Signs BP 151/71 (BP Location: Left Arm)   Pulse (!) 50   Temp 97.7 F (36.5 C) (Oral)   Resp 20   Ht 5\' 5"  (1.651 m)   Wt 87.5 kg   SpO2 97%   BMI 32.12 kg/m   Physical Exam  Constitutional: He appears well-developed and well-nourished. No distress.  HENT:  Head: Normocephalic and atraumatic.  Eyes: Right eye exhibits no discharge. Left eye exhibits no discharge.  Pulmonary/Chest: Effort normal. No respiratory distress.  Abdominal: Soft. He exhibits no distension and no mass. There is no tenderness. There is no guarding.  Abdomen is soft and nontender to palpation.  Genitourinary:  Genitourinary Comments: Small 1.5 cm area of erythema with slight induration to his suprapubic area. Evidence of some drainage. No active drainage. No fluctuance. No surrounding erythema.   Neurological: He is alert. Coordination normal.  Skin: Skin is warm and dry. Capillary refill takes less than 2 seconds. No rash noted. He is not diaphoretic. There is erythema. No pallor.  See GU   Psychiatric: He has a normal mood and affect. His behavior is normal.  Nursing note and vitals reviewed.    ED Treatments / Results  Labs (all labs ordered are listed, but only abnormal results are displayed) Labs  Reviewed - No data to display  EKG  EKG Interpretation None       Radiology No results found. EMERGENCY DEPARTMENT US SOFT TISSUE INTERPRETATION "Study: Limited Ultrasound of the noted body part in comments below"  INDICATIONS: Soft tissue infection Multiple views of the body part are obtained with a multi-frequency linear probe  PERFORMED BY:  Myself  IMAGES ARCHIVED?: Yes  SIDE:Midline  BODY PART:Pelvic wall  FINDINGS: Cellulitis present  LIMITATIONS:  Body Habitus  INTERPRETATION:  Cellulitis present  COMMENT:  Cellulitis, but no focal abscess requiring drainage noted.    Procedures Procedures (including critical care time)  Medications Ordered in ED Medications - No data to display   Initial Impression / Assessment and Plan / ED Course  I have reviewed  the triage vital signs and the nursing notes.  Pertinent labs & imaging results that were available during my care of the patient were reviewed by me and considered in my medical decision making (see chart for details).  Clinical Course    This  is a 80 y.o. Male who presents to the ED complaining of an area of pain and swelling to his groin since today. The patient reports he got up to use the bathroom and noticed a small area that was draining to his suprapubic area. He reports only minimal pain. He denies injury or trauma to the area. No fevers. On exam the patient is afebrile nontoxic appearing. He is a small 1.5 cm area erythema noted to his suprapubic area. No surrounding erythema. Evidence of some previous discharge present. No fluctuance. On ultrasound there is evidence of cellulitis without abscess to drain. Possible folliculitis. Will start the patient on Bactroban ointment as well as oral Bactrim for cellulitis and have him follow-up closely with his primary care doctor. I discussed strict and specific return precautions. I advised the patient to follow-up with their primary care provider this week. I  advised the patient to return to the emergency department with new or worsening symptoms or new concerns. The patient verbalized understanding and agreement with plan.    Final Clinical Impressions(s) / ED Diagnoses   Final diagnoses:  Cellulitis of abdominal wall    New Prescriptions New Prescriptions   MUPIROCIN CREAM (BACTROBAN) 2 %    Apply 1 application topically 2 (two) times daily.   SULFAMETHOXAZOLE-TRIMETHOPRIM (BACTRIM DS,SEPTRA DS) 800-160 MG TABLET    Take 1 tablet by mouth 2 (two) times daily.     Waynetta Pean, PA-C 09/17/16 [place], DO 09/17/16 [id number]

## 2016-09-17 NOTE — ED Triage Notes (Signed)
Pt states he noticed a knot in his left groin area that seems to be draining/bleeding.  Pt having minimal pain

## 2016-09-25 ENCOUNTER — Encounter (INDEPENDENT_AMBULATORY_CARE_PROVIDER_SITE_OTHER): Payer: Medicare HMO | Admitting: Ophthalmology

## 2016-09-25 DIAGNOSIS — H353132 Nonexudative age-related macular degeneration, bilateral, intermediate dry stage: Secondary | ICD-10-CM

## 2016-09-25 DIAGNOSIS — H35033 Hypertensive retinopathy, bilateral: Secondary | ICD-10-CM | POA: Diagnosis not present

## 2016-09-25 DIAGNOSIS — I10 Essential (primary) hypertension: Secondary | ICD-10-CM | POA: Diagnosis not present

## 2016-09-25 DIAGNOSIS — H43813 Vitreous degeneration, bilateral: Secondary | ICD-10-CM

## 2016-09-25 DIAGNOSIS — D3131 Benign neoplasm of right choroid: Secondary | ICD-10-CM

## 2016-09-25 DIAGNOSIS — H34812 Central retinal vein occlusion, left eye, with macular edema: Secondary | ICD-10-CM | POA: Diagnosis not present

## 2016-10-05 ENCOUNTER — Other Ambulatory Visit: Payer: Self-pay | Admitting: Cardiology

## 2016-11-13 ENCOUNTER — Encounter (INDEPENDENT_AMBULATORY_CARE_PROVIDER_SITE_OTHER): Payer: Medicare HMO | Admitting: Ophthalmology

## 2016-11-13 DIAGNOSIS — H34812 Central retinal vein occlusion, left eye, with macular edema: Secondary | ICD-10-CM | POA: Diagnosis not present

## 2016-11-13 DIAGNOSIS — I10 Essential (primary) hypertension: Secondary | ICD-10-CM | POA: Diagnosis not present

## 2016-11-13 DIAGNOSIS — D3131 Benign neoplasm of right choroid: Secondary | ICD-10-CM | POA: Diagnosis not present

## 2016-11-13 DIAGNOSIS — H43813 Vitreous degeneration, bilateral: Secondary | ICD-10-CM | POA: Diagnosis not present

## 2016-11-13 DIAGNOSIS — H353132 Nonexudative age-related macular degeneration, bilateral, intermediate dry stage: Secondary | ICD-10-CM | POA: Diagnosis not present

## 2016-11-13 DIAGNOSIS — H35033 Hypertensive retinopathy, bilateral: Secondary | ICD-10-CM | POA: Diagnosis not present

## 2016-12-21 ENCOUNTER — Encounter (INDEPENDENT_AMBULATORY_CARE_PROVIDER_SITE_OTHER): Payer: Medicare HMO | Admitting: Ophthalmology

## 2017-01-06 ENCOUNTER — Encounter: Payer: Self-pay | Admitting: Cardiology

## 2017-01-11 ENCOUNTER — Encounter (INDEPENDENT_AMBULATORY_CARE_PROVIDER_SITE_OTHER): Payer: Medicare HMO | Admitting: Ophthalmology

## 2017-01-11 DIAGNOSIS — H35033 Hypertensive retinopathy, bilateral: Secondary | ICD-10-CM

## 2017-01-11 DIAGNOSIS — H34812 Central retinal vein occlusion, left eye, with macular edema: Secondary | ICD-10-CM | POA: Diagnosis not present

## 2017-01-11 DIAGNOSIS — H43813 Vitreous degeneration, bilateral: Secondary | ICD-10-CM

## 2017-01-11 DIAGNOSIS — H353112 Nonexudative age-related macular degeneration, right eye, intermediate dry stage: Secondary | ICD-10-CM

## 2017-01-11 DIAGNOSIS — I10 Essential (primary) hypertension: Secondary | ICD-10-CM | POA: Diagnosis not present

## 2017-02-02 ENCOUNTER — Ambulatory Visit (INDEPENDENT_AMBULATORY_CARE_PROVIDER_SITE_OTHER): Payer: Medicare HMO | Admitting: Urology

## 2017-02-02 DIAGNOSIS — R972 Elevated prostate specific antigen [PSA]: Secondary | ICD-10-CM

## 2017-02-02 DIAGNOSIS — N401 Enlarged prostate with lower urinary tract symptoms: Secondary | ICD-10-CM | POA: Diagnosis not present

## 2017-02-03 ENCOUNTER — Ambulatory Visit: Payer: Medicare HMO | Admitting: Cardiology

## 2017-02-04 ENCOUNTER — Encounter (INDEPENDENT_AMBULATORY_CARE_PROVIDER_SITE_OTHER): Payer: Medicare HMO | Admitting: Ophthalmology

## 2017-02-04 DIAGNOSIS — H34812 Central retinal vein occlusion, left eye, with macular edema: Secondary | ICD-10-CM | POA: Diagnosis not present

## 2017-02-04 DIAGNOSIS — H353112 Nonexudative age-related macular degeneration, right eye, intermediate dry stage: Secondary | ICD-10-CM

## 2017-02-04 DIAGNOSIS — H35033 Hypertensive retinopathy, bilateral: Secondary | ICD-10-CM

## 2017-02-04 DIAGNOSIS — I10 Essential (primary) hypertension: Secondary | ICD-10-CM | POA: Diagnosis not present

## 2017-02-04 DIAGNOSIS — H43813 Vitreous degeneration, bilateral: Secondary | ICD-10-CM

## 2017-02-10 ENCOUNTER — Other Ambulatory Visit: Payer: Self-pay | Admitting: Cardiology

## 2017-02-24 ENCOUNTER — Encounter (INDEPENDENT_AMBULATORY_CARE_PROVIDER_SITE_OTHER): Payer: Medicare HMO | Admitting: Ophthalmology

## 2017-02-24 ENCOUNTER — Ambulatory Visit: Payer: Medicare HMO | Admitting: Cardiology

## 2017-02-24 DIAGNOSIS — H353132 Nonexudative age-related macular degeneration, bilateral, intermediate dry stage: Secondary | ICD-10-CM | POA: Diagnosis not present

## 2017-02-24 DIAGNOSIS — I10 Essential (primary) hypertension: Secondary | ICD-10-CM

## 2017-02-24 DIAGNOSIS — H35033 Hypertensive retinopathy, bilateral: Secondary | ICD-10-CM

## 2017-02-24 DIAGNOSIS — H43813 Vitreous degeneration, bilateral: Secondary | ICD-10-CM | POA: Diagnosis not present

## 2017-02-24 DIAGNOSIS — H34812 Central retinal vein occlusion, left eye, with macular edema: Secondary | ICD-10-CM

## 2017-02-24 DIAGNOSIS — D3131 Benign neoplasm of right choroid: Secondary | ICD-10-CM

## 2017-03-04 ENCOUNTER — Encounter (INDEPENDENT_AMBULATORY_CARE_PROVIDER_SITE_OTHER): Payer: Medicare HMO | Admitting: Ophthalmology

## 2017-03-16 NOTE — Progress Notes (Signed)
Cardiology Office Note  Date: 03/17/2017   ID: [name] [name], DOB 1936-04-02, MRN [id number]  PCP: Dione Housekeeper, MD  Primary Cardiologist: Rozann Lesches, MD   Chief Complaint  Patient presents with  . Coronary Artery Disease    History of Present Illness: [name] [name] is a 81 y.o. male last seen in September 2017. He presents for a routine follow-up visit. Reports no angina symptoms or increased nitroglycerin use, still has an old bottle that needs to be refilled. He reports compliance with his medications. Does have intermittent leg edema, improves when he puts his feet up. He is not on diuretic at this time and does have renal insufficiency at baseline.  We continue medical therapy for CAD with previous CABG and graft disease. Current regimen includes Norvasc, Lipitor, Coreg, Plavix, lisinopril, and as needed nitroglycerin.  Last formal ischemic testing was in 2013.  Past Medical History:  Diagnosis Date  . Arthritis   . Chronic constipation   . Coronary atherosclerosis of native coronary artery    Multivessel and bypass graft disease  . Diverticulosis 2007   Colonoscopy  . Essential hypertension, benign   . Fatty liver disease, nonalcoholic   . GERD (gastroesophageal reflux disease)   . Headache(784.0)   . Hiatal hernia   . Ischemic cardiomyopathy    LVEF 35%  . Mixed hyperlipidemia   . Myocardial infarction ([place])   . Nephrolithiasis   . Renal insufficiency   . Schatzki's ring    Last EGD/ED Dr Paul Half    Past Surgical History:  Procedure Laterality Date  . CORONARY ARTERY BYPASS GRAFT  2002   LIMA to LAD, SVG to diagonal and ramus, SVG to PDA and PLA  . HEMORRHOID SURGERY    . KIDNEY STONE SURGERY     right  . KNEE ARTHROSCOPY     left  . NEPHRECTOMY     left  . TOTAL KNEE ARTHROPLASTY  08/01/2012   Procedure: TOTAL KNEE ARTHROPLASTY;  Surgeon: Tobi Bastos, MD;  Location: [facility];  Service: Orthopedics;  Laterality: Left;    Current  Outpatient Prescriptions  Medication Sig Dispense Refill  . acetaminophen (TYLENOL) 500 MG tablet Take 1,000 mg by mouth every 6 (six) hours as needed for mild pain or moderate pain.    Marland Kitchen amLODipine (NORVASC) 10 MG tablet TAKE 1 TABLET AT BEDTIME 90 tablet 3  . atorvastatin (LIPITOR) 20 MG tablet TAKE 1 TABLET EVERY DAY 90 tablet 3  . carvedilol (COREG) 12.5 MG tablet TAKE 1 TABLET TWICE DAILY WITH A MEAL 180 tablet 3  . clopidogrel (PLAVIX) 75 MG tablet TAKE 1 TABLET EVERY DAY 90 tablet 3  . dorzolamide-timolol (COSOPT) 22.3-6.8 MG/ML ophthalmic solution     . finasteride (PROSCAR) 5 MG tablet Take 5 mg by mouth daily.    Marland Kitchen lisinopril (PRINIVIL,ZESTRIL) 30 MG tablet TAKE 1 TABLET EVERY DAY 90 tablet 3  . mupirocin cream (BACTROBAN) 2 % Apply 1 application topically 2 (two) times daily. 15 g 1  . nitroGLYCERIN (NITROSTAT) 0.4 MG SL tablet Place 1 tablet (0.4 mg total) under the tongue every 5 (five) minutes as needed for chest pain. 25 tablet 3  . pantoprazole (PROTONIX) 40 MG tablet TAKE 1 TABLET EVERY DAY 90 tablet 3   No current facility-administered medications for this visit.    Allergies:  Amoxicillin   Social History: The patient  reports that he quit smoking about 55 years ago. His smoking use included Cigarettes. He started smoking about  66 years ago. He has a 1.00 pack-year smoking history. His smokeless tobacco use includes Chew. He reports that he does not drink alcohol or use drugs.   ROS:  Please see the history of present illness. Otherwise, complete review of systems is positive for occasional indigestion.  All other systems are reviewed and negative.   Physical Exam: VS:  BP 132/66   Pulse (!) 54   Ht 5\' 4"  (1.626 m)   Wt 191 lb 12.8 oz (87 kg)   SpO2 97%   BMI 32.92 kg/m , BMI Body mass index is 32.92 kg/m.  Wt Readings from Last 3 Encounters:  03/17/17 191 lb 12.8 oz (87 kg)  09/17/16 193 lb (87.5 kg)  08/05/16 193 lb (87.5 kg)    Overweight male in no acute  distress.  HEENT: Conjunctiva and lids normal, oropharynx with poor dentition.  Neck: Supple, no elevated JVP or bruits.  Lungs: Diminished without wheezing, nonlabored.  Cardiac: Regular rate and rhythm, indistinct PMI, no S3.  Abdomen: Soft, nontender, bowel sounds present.  Skin: Warm and dry.  Extremities: 1+ edema below the knees, symmetrical. Distal pulses one plus.  ECG: I personally reviewed the tracing from 08/05/2016 which showed sinus bradycardia with IVCD consistent with left bundle branch block.  Recent Labwork:  June 2017: BUN 16, creatinine 1.7, potassium 4.4, AST 12, ALT 6, hemoglobin 14.6, platelets 183, cholesterol 119, triglycerides 111, HDL 42, LDL 55  Other Studies Reviewed Today:  Lexiscan Myoview August 2013: Overall low risk, equivocal ST segment changes, scar involving the apex and inferior wall without any large residual ischemia, LVEF 56% with inferior hypokinesis.  Assessment and Plan:  1. Multivessel CAD status post CABG in 2002 with graft disease. Last ischemic testing was in 2013, overall low risk with inferior scar. In the absence of progressing angina we will continue with medical therapy and observation for now. Refill given for fresh bottle of nitroglycerin.  2. Essential hypertension, blood pressure is adequately controlled on current regimen.  3. Hyperlipidemia, recent LDL in the 50s, tolerating Lipitor.  Current medicines were reviewed with the patient today.  Disposition: Follow-up in 6 months.  Signed, Satira Sark, MD, [facility] 03/17/2017 4:04 PM     [facility] [address] [place], [place] [address] Phone: [phone]; Fax: [phone]

## 2017-03-17 ENCOUNTER — Encounter: Payer: Self-pay | Admitting: Cardiology

## 2017-03-17 ENCOUNTER — Ambulatory Visit (INDEPENDENT_AMBULATORY_CARE_PROVIDER_SITE_OTHER): Payer: Medicare HMO | Admitting: Cardiology

## 2017-03-17 VITALS — BP 132/66 | HR 54 | Ht 64.0 in | Wt 191.8 lb

## 2017-03-17 DIAGNOSIS — I2510 Atherosclerotic heart disease of native coronary artery without angina pectoris: Secondary | ICD-10-CM | POA: Diagnosis not present

## 2017-03-17 DIAGNOSIS — I10 Essential (primary) hypertension: Secondary | ICD-10-CM

## 2017-03-17 DIAGNOSIS — E782 Mixed hyperlipidemia: Secondary | ICD-10-CM | POA: Diagnosis not present

## 2017-03-17 MED ORDER — NITROGLYCERIN 0.4 MG SL SUBL: 0.4000 mg | SUBLINGUAL_TABLET | SUBLINGUAL | 3 refills | Status: DC | PRN

## 2017-03-17 NOTE — Patient Instructions (Signed)

## 2017-03-24 ENCOUNTER — Encounter (INDEPENDENT_AMBULATORY_CARE_PROVIDER_SITE_OTHER): Payer: Medicare HMO | Admitting: Ophthalmology

## 2017-03-24 DIAGNOSIS — H34812 Central retinal vein occlusion, left eye, with macular edema: Secondary | ICD-10-CM | POA: Diagnosis not present

## 2017-03-24 DIAGNOSIS — D3131 Benign neoplasm of right choroid: Secondary | ICD-10-CM | POA: Diagnosis not present

## 2017-03-24 DIAGNOSIS — H35033 Hypertensive retinopathy, bilateral: Secondary | ICD-10-CM | POA: Diagnosis not present

## 2017-03-24 DIAGNOSIS — H353112 Nonexudative age-related macular degeneration, right eye, intermediate dry stage: Secondary | ICD-10-CM | POA: Diagnosis not present

## 2017-03-24 DIAGNOSIS — H43813 Vitreous degeneration, bilateral: Secondary | ICD-10-CM | POA: Diagnosis not present

## 2017-03-24 DIAGNOSIS — I10 Essential (primary) hypertension: Secondary | ICD-10-CM | POA: Diagnosis not present

## 2017-04-21 ENCOUNTER — Encounter (INDEPENDENT_AMBULATORY_CARE_PROVIDER_SITE_OTHER): Payer: Medicare HMO | Admitting: Ophthalmology

## 2017-04-21 DIAGNOSIS — H35033 Hypertensive retinopathy, bilateral: Secondary | ICD-10-CM

## 2017-04-21 DIAGNOSIS — H34812 Central retinal vein occlusion, left eye, with macular edema: Secondary | ICD-10-CM

## 2017-04-21 DIAGNOSIS — I10 Essential (primary) hypertension: Secondary | ICD-10-CM

## 2017-04-21 DIAGNOSIS — H43813 Vitreous degeneration, bilateral: Secondary | ICD-10-CM

## 2017-04-21 DIAGNOSIS — H353112 Nonexudative age-related macular degeneration, right eye, intermediate dry stage: Secondary | ICD-10-CM

## 2017-04-21 DIAGNOSIS — D3131 Benign neoplasm of right choroid: Secondary | ICD-10-CM

## 2017-05-19 ENCOUNTER — Encounter (INDEPENDENT_AMBULATORY_CARE_PROVIDER_SITE_OTHER): Payer: Medicare HMO | Admitting: Ophthalmology

## 2017-05-19 DIAGNOSIS — H34812 Central retinal vein occlusion, left eye, with macular edema: Secondary | ICD-10-CM

## 2017-05-19 DIAGNOSIS — I10 Essential (primary) hypertension: Secondary | ICD-10-CM

## 2017-05-19 DIAGNOSIS — H353112 Nonexudative age-related macular degeneration, right eye, intermediate dry stage: Secondary | ICD-10-CM

## 2017-05-19 DIAGNOSIS — H43813 Vitreous degeneration, bilateral: Secondary | ICD-10-CM | POA: Diagnosis not present

## 2017-05-19 DIAGNOSIS — H35033 Hypertensive retinopathy, bilateral: Secondary | ICD-10-CM

## 2017-06-21 ENCOUNTER — Other Ambulatory Visit: Payer: Self-pay | Admitting: Cardiology

## 2017-06-24 ENCOUNTER — Encounter (INDEPENDENT_AMBULATORY_CARE_PROVIDER_SITE_OTHER): Payer: Medicare HMO | Admitting: Ophthalmology

## 2017-06-24 DIAGNOSIS — H34812 Central retinal vein occlusion, left eye, with macular edema: Secondary | ICD-10-CM

## 2017-06-24 DIAGNOSIS — D3131 Benign neoplasm of right choroid: Secondary | ICD-10-CM | POA: Diagnosis not present

## 2017-06-24 DIAGNOSIS — I10 Essential (primary) hypertension: Secondary | ICD-10-CM

## 2017-06-24 DIAGNOSIS — H43813 Vitreous degeneration, bilateral: Secondary | ICD-10-CM

## 2017-06-24 DIAGNOSIS — H35033 Hypertensive retinopathy, bilateral: Secondary | ICD-10-CM

## 2017-06-24 DIAGNOSIS — H353112 Nonexudative age-related macular degeneration, right eye, intermediate dry stage: Secondary | ICD-10-CM

## 2017-07-17 ENCOUNTER — Encounter (HOSPITAL_COMMUNITY): Payer: Self-pay

## 2017-07-17 ENCOUNTER — Emergency Department (HOSPITAL_COMMUNITY): Payer: Medicare HMO

## 2017-07-17 ENCOUNTER — Emergency Department (HOSPITAL_COMMUNITY)
Admission: EM | Admit: 2017-07-17 | Discharge: 2017-07-17 | Disposition: A | Discharge status: Home / Self Care | Payer: Medicare HMO | Attending: Emergency Medicine | Admitting: Emergency Medicine

## 2017-07-17 DIAGNOSIS — W19XXXA Unspecified fall, initial encounter: Secondary | ICD-10-CM

## 2017-07-17 DIAGNOSIS — I2510 Atherosclerotic heart disease of native coronary artery without angina pectoris: Secondary | ICD-10-CM | POA: Insufficient documentation

## 2017-07-17 DIAGNOSIS — Z7902 Long term (current) use of antithrombotics/antiplatelets: Secondary | ICD-10-CM | POA: Diagnosis not present

## 2017-07-17 DIAGNOSIS — F17220 Nicotine dependence, chewing tobacco, uncomplicated: Secondary | ICD-10-CM | POA: Insufficient documentation

## 2017-07-17 DIAGNOSIS — Z79899 Other long term (current) drug therapy: Secondary | ICD-10-CM | POA: Diagnosis not present

## 2017-07-17 DIAGNOSIS — N289 Disorder of kidney and ureter, unspecified: Secondary | ICD-10-CM

## 2017-07-17 DIAGNOSIS — I10 Essential (primary) hypertension: Secondary | ICD-10-CM | POA: Diagnosis not present

## 2017-07-17 DIAGNOSIS — M545 Low back pain, unspecified: Secondary | ICD-10-CM

## 2017-07-17 DIAGNOSIS — N390 Urinary tract infection, site not specified: Secondary | ICD-10-CM | POA: Insufficient documentation

## 2017-07-17 LAB — COMPREHENSIVE METABOLIC PANEL
ALT: 21 U/L (ref 17–63)
AST: 23 U/L (ref 15–41)
Albumin: 3.3 g/dL — ABNORMAL LOW (ref 3.5–5.0)
Alkaline Phosphatase: 83 U/L (ref 38–126)
Anion gap: 8 (ref 5–15)
BUN: 15 mg/dL (ref 6–20)
CO2: 25 mmol/L (ref 22–32)
Calcium: 8.8 mg/dL — ABNORMAL LOW (ref 8.9–10.3)
Chloride: 106 mmol/L (ref 101–111)
Creatinine, Ser: 1.38 mg/dL — ABNORMAL HIGH (ref 0.61–1.24)
GFR, EST AFRICAN AMERICAN: 55 mL/min — AB (ref >60)
GFR, EST NON AFRICAN AMERICAN: 47 mL/min — AB (ref >60)
Glucose, Bld: 91 mg/dL (ref 65–99)
POTASSIUM: 4.2 mmol/L (ref 3.5–5.1)
Sodium: 139 mmol/L (ref 135–145)
Total Bilirubin: 0.6 mg/dL (ref 0.3–1.2)
Total Protein: 6.4 g/dL — ABNORMAL LOW (ref 6.5–8.1)

## 2017-07-17 LAB — URINALYSIS, ROUTINE W REFLEX MICROSCOPIC
BILIRUBIN URINE: NEGATIVE
GLUCOSE, UA: NEGATIVE mg/dL
HGB URINE DIPSTICK: NEGATIVE
KETONES UR: NEGATIVE mg/dL
NITRITE: NEGATIVE
PROTEIN: NEGATIVE mg/dL
Specific Gravity, Urine: 1.013 (ref 1.005–1.030)
pH: 5 (ref 5.0–8.0)

## 2017-07-17 LAB — CBC WITH DIFFERENTIAL/PLATELET
BASOS ABS: 0.1 10*3/uL (ref 0.0–0.1)
BASOS PCT: 1 %
EOS PCT: 6 %
Eosinophils Absolute: 0.3 10*3/uL (ref 0.0–0.7)
HCT: 42.6 % (ref 39.0–52.0)
Hemoglobin: 14.3 g/dL (ref 13.0–17.0)
LYMPHS PCT: 23 %
Lymphs Abs: 1.3 10*3/uL (ref 0.7–4.0)
MCH: 27 pg (ref 26.0–34.0)
MCHC: 33.6 g/dL (ref 30.0–36.0)
MCV: 80.4 fL (ref 78.0–100.0)
Monocytes Absolute: 0.9 10*3/uL (ref 0.1–1.0)
Monocytes Relative: 16 %
Neutro Abs: 3.1 10*3/uL (ref 1.7–7.7)
Neutrophils Relative %: 54 %
Platelets: 171 10*3/uL (ref 150–400)
RBC: 5.3 MIL/uL (ref 4.22–5.81)
RDW: 13.5 % (ref 11.5–15.5)
WBC: 5.7 10*3/uL (ref 4.0–10.5)

## 2017-07-17 LAB — LIPASE, BLOOD: LIPASE: 39 U/L (ref 11–51)

## 2017-07-17 LAB — TROPONIN I: Troponin I: <0.03 ng/mL (ref <0.03)

## 2017-07-17 MED ORDER — SULFAMETHOXAZOLE-TRIMETHOPRIM 800-160 MG PO TABS
1.0000 | ORAL_TABLET | Freq: Once | ORAL | Status: AC
  Administered 2017-07-17: 1 via ORAL
  Filled 2017-07-17: qty 1

## 2017-07-17 MED ORDER — SULFAMETHOXAZOLE-TRIMETHOPRIM 800-160 MG PO TABS: 1.0000 | ORAL_TABLET | Freq: Two times a day (BID) | ORAL | 0 refills | Status: AC

## 2017-07-17 NOTE — ED Triage Notes (Signed)
Pt reports that he fell this morning due to slipping on a floor. He reports that he did not lose consciousness, but complains of back and neck pain. He reports that last week his lower abdomen was hurting but is better now after passing gas. Ears are popping

## 2017-07-17 NOTE — Discharge Instructions (Signed)
Your testing shows that you have a likely urinary tract infection Ibuprofen 600mg  by mouth every 8 hours as needed for pain Bactrim twice daily for 7 days for infection ER for severe or worsening pain, fevers, vomiting

## 2017-07-17 NOTE — ED Notes (Signed)
ED Provider at bedside. 

## 2017-07-17 NOTE — ED Notes (Signed)
Pt denies seeing his doctor concerning back pain and takes tylenol at home for pain but denies taking any PTA.

## 2017-07-17 NOTE — ED Provider Notes (Signed)
[facility] DEPT Provider Note   CSN: [id number] Arrival date & time: 07/17/17  9628     History   Chief Complaint Chief Complaint  Patient presents with  . Fall    HPI [name] [name] is a 81 y.o. male.  HPI  The patient is a 81 year old male, history of acid reflux, ischemic cardiomyopathy status post coronary bypass grafting, history of nephrectomy and has a solitary right kidney left, renal insufficiency. He states that over the last several days he has had a general feeling of not feeling well, yesterday he was mowing his yard and felt like he was having some difficulty with a abnormal feeling in his chest, he was feeling weak, he has generally not felt well since yesterday, today he slipped on the floor in his kitchen which she attributes to a wet floor and fell onto his buttock injuring his lower back. At this time the patient complains of having neck pain on the right, lower back pain in the middle and feeling generally poorly though hedoes not give me any more information or specifics about what feels poorly. Specifically he denies fevers coughing shortness of breath vomiting or diarrhea or dysuria.  Past Medical History:  Diagnosis Date  . Arthritis   . Chronic constipation   . Coronary atherosclerosis of native coronary artery    Multivessel and bypass graft disease  . Diverticulosis 2007   Colonoscopy  . Essential hypertension, benign   . Fatty liver disease, nonalcoholic   . GERD (gastroesophageal reflux disease)   . Headache(784.0)   . Hiatal hernia   . Ischemic cardiomyopathy    LVEF 35%  . Mixed hyperlipidemia   . Myocardial infarction ([place])   . Nephrolithiasis   . Renal insufficiency   . Schatzki's ring    Last EGD/ED Dr Paul Half    Patient Active Problem List   Diagnosis Date Noted  . S/P total knee arthroplasty 08/02/2012  . Essential hypertension, benign 06/04/2010  . CORONARY ATHEROSCLEROSIS NATIVE CORONARY ARTERY 06/04/2010  .  CARDIOMYOPATHY, ISCHEMIC 07/26/2009  . HYPERCHOLESTEROLEMIA 02/18/2009  . TOBACCO USER 02/18/2009  . Esophageal reflux 02/18/2009    Past Surgical History:  Procedure Laterality Date  . CORONARY ARTERY BYPASS GRAFT  2002   LIMA to LAD, SVG to diagonal and ramus, SVG to PDA and PLA  . HEMORRHOID SURGERY    . KIDNEY STONE SURGERY     right  . KNEE ARTHROSCOPY     left  . NEPHRECTOMY     left  . TOTAL KNEE ARTHROPLASTY  08/01/2012   Procedure: TOTAL KNEE ARTHROPLASTY;  Surgeon: Tobi Bastos, MD;  Location: [facility];  Service: Orthopedics;  Laterality: Left;       Home Medications    Prior to Admission medications   Medication Sig Start Date End Date Taking? Authorizing Provider  acetaminophen (TYLENOL) 500 MG tablet Take 1,000 mg by mouth every 6 (six) hours as needed for mild pain or moderate pain.   Yes [provider]  amLODipine (NORVASC) 10 MG tablet TAKE 1 TABLET AT BEDTIME 08/06/16  Yes Satira Sark, MD  atorvastatin (LIPITOR) 20 MG tablet TAKE 1 TABLET EVERY DAY 02/11/17  Yes Satira Sark, MD  carvedilol (COREG) 12.5 MG tablet TAKE 1 TABLET TWICE DAILY WITH A MEAL 10/05/16  Yes Satira Sark, MD  cholecalciferol (VITAMIN D) 1000 units tablet Take 1,000 Units by mouth daily.   Yes [provider]  clopidogrel (PLAVIX) 75 MG tablet TAKE 1 TABLET EVERY  DAY 06/22/17  Yes Satira Sark, MD  dorzolamide-timolol (COSOPT) 22.3-6.8 MG/ML ophthalmic solution Place 1 drop into both eyes 2 (two) times daily.  05/28/15  Yes [provider]  finasteride (PROSCAR) 5 MG tablet Take 5 mg by mouth daily.   Yes [provider]  latanoprost (XALATAN) 0.005 % ophthalmic solution Place 1 drop into both eyes at bedtime. 06/28/17  Yes [provider]  lisinopril (PRINIVIL,ZESTRIL) 30 MG tablet TAKE 1 TABLET EVERY DAY 06/22/17  Yes Satira Sark, MD  nitroGLYCERIN (NITROSTAT) 0.4 MG SL tablet Place 1 tablet (0.4 mg total) under the  tongue every 5 (five) minutes as needed for chest pain. 03/17/17  Yes Satira Sark, MD  pantoprazole (PROTONIX) 40 MG tablet TAKE 1 TABLET EVERY DAY 06/22/17  Yes Satira Sark, MD  sulfamethoxazole-trimethoprim (BACTRIM DS,SEPTRA DS) 800-160 MG tablet Take 1 tablet by mouth 2 (two) times daily. 07/17/17 07/24/17  Noemi Chapel, MD    Family History Family History  Problem Relation Age of Onset  . Coronary artery disease Father     Social History Social History  Substance Use Topics  . Smoking status: Former Smoker    Packs/day: 0.50    Years: 2.00    Types: Cigarettes    Start date: 01/07/1951    Quit date: 05/20/1961  . Smokeless tobacco: Current User    Types: Chew     Comment: quit 50 yrs ago  . Alcohol use No     Allergies   Amoxicillin   Review of Systems Review of Systems  All other systems reviewed and are negative.    Physical Exam Updated Vital Signs BP (!) 148/70   Pulse (!) 50   Temp 98 F (36.7 C)   Resp 17   SpO2 98%   Physical Exam  Constitutional: He appears well-developed and well-nourished. No distress.  HENT:  Head: Normocephalic and atraumatic.  Mouth/Throat: No oropharyngeal exudate.  Mucous membranes slightly dehydrated  Eyes: Pupils are equal, round, and reactive to light. Conjunctivae and EOM are normal. Right eye exhibits no discharge. Left eye exhibits no discharge. No scleral icterus.  Neck: Normal range of motion. Neck supple. No JVD present. No thyromegaly present.  Cardiovascular: Normal rate, regular rhythm, normal heart sounds and intact distal pulses.  Exam reveals no gallop and no friction rub.   No murmur heard. Pulmonary/Chest: Effort normal and breath sounds normal. No respiratory distress. He has no wheezes. He has no rales.  Abdominal: Soft. Bowel sounds are normal. He exhibits no distension and no mass. There is tenderness ( bilateral lower abdominal tenderness, no guarding, no upper abdominal tenderness, no  peritoneal signs).  Musculoskeletal: Normal range of motion. He exhibits edema ( right greater than left lower extremity pitting edema which she states is chronic after vein harvesting). He exhibits no tenderness or deformity.  No ttp over the spine - opnly Lumbar paraspinal muscles  Lymphadenopathy:    He has no cervical adenopathy.  Neurological: He is alert. Coordination normal.  The patient is able to follow commands, normal strength of all 4 extremities.  Skin: Skin is warm and dry. No rash noted. No erythema.  No rashes seen  Psychiatric: He has a normal mood and affect. His behavior is normal.  Nursing note and vitals reviewed.    ED Treatments / Results  Labs (all labs ordered are listed, but only abnormal results are displayed) Labs Reviewed  URINALYSIS, ROUTINE W REFLEX MICROSCOPIC - Abnormal; Notable for the following:  Result Value   APPearance HAZY (*)    Leukocytes, UA LARGE (*)    Bacteria, UA RARE (*)    Squamous Epithelial / LPF 0-5 (*)    Crystals PRESENT (*)    All other components within normal limits  COMPREHENSIVE METABOLIC PANEL - Abnormal; Notable for the following:    Creatinine, Ser 1.38 (*)    Calcium 8.8 (*)    Total Protein 6.4 (*)    Albumin 3.3 (*)    GFR calc non Af Amer 47 (*)    GFR calc Af Amer 55 (*)    All other components within normal limits  URINE CULTURE  CBC WITH DIFFERENTIAL/PLATELET  LIPASE, BLOOD  TROPONIN I    EKG  EKG Interpretation  Date/Time:  Saturday July 17 2017 17:51:30 EDT Ventricular Rate:  51 PR Interval:    QRS Duration: 157 QT Interval:  498 QTC Calculation: 459 R Axis:   101 Text Interpretation:  Sinus rhythm Minimal ST depression, anterolateral leads Left bundle branch block since last tracing no significant change Confirmed by Noemi Chapel [phone]) on 07/17/2017 8:40:04 PM       Radiology Dg Lumbar Spine Complete  Result Date: 07/17/2017 CLINICAL DATA:  81 year old male with history of trauma  from a fall this morning. Back pain. EXAM: LUMBAR SPINE - COMPLETE 4+ VIEW COMPARISON:  None. FINDINGS: No acute displaced fracture or compression type fracture noted. Alignment is anatomic. No defects of the pars interarticularis. Multilevel degenerative disc disease and facet arthropathy, most severe at L4-L5 and L5-S1. Aortic atherosclerosis. IMPRESSION: 1. No acute radiographic abnormality of the lumbar spine. 2. Multilevel degenerative disc disease and lumbar spondylosis, as above. 3. Aortic atherosclerosis. Aortic Atherosclerosis (ICD10-I70.0). Electronically Signed   By: Vinnie Langton M.D.   On: 07/17/2017 18:59    Procedures Procedures (including critical care time)  Medications Ordered in ED Medications  sulfamethoxazole-trimethoprim (BACTRIM DS,SEPTRA DS) 800-160 MG per tablet 1 tablet (not administered)     Initial Impression / Assessment and Plan / ED Course  I have reviewed the triage vital signs and the nursing notes.  Pertinent labs & imaging results that were available during my care of the patient were reviewed by me and considered in my medical decision making (see chart for details).     The exact cause of the patient's symptoms is unclear, he has some very vague symptoms of fatigue and possibly some chest pain that occurred yesterday while mowing his lawn however he does not have the chest pain at this time and he did not actually describe it as chest pain but more of a burning sensation on "the outside of my body". He has some abdominal tenderness in the lower part of his abdomen, check urinalysis labs EKG and troponin.We'll also obtain a lower back x-ray to make sure he is not suffered a compression fracture of the spine.  xrays neg Labs show improved renal function UTI likely CBC normal Pt agreeable to d/c declines any meds other than abx. Stable for d;c.  Final Clinical Impressions(s) / ED Diagnoses   Final diagnoses:  Urinary tract infection without  hematuria, site unspecified  Fall, initial encounter  Lumbar pain  Renal insufficiency    New Prescriptions New Prescriptions   SULFAMETHOXAZOLE-TRIMETHOPRIM (BACTRIM DS,SEPTRA DS) 800-160 MG TABLET    Take 1 tablet by mouth 2 (two) times daily.     Noemi Chapel, MD 07/17/17 2116

## 2017-07-17 NOTE — ED Notes (Signed)
Pt states back pain worse since slipping on wet floor at home and landed on bottom. Pt denies hitting head.

## 2017-07-19 LAB — URINE CULTURE

## 2017-07-22 ENCOUNTER — Encounter (INDEPENDENT_AMBULATORY_CARE_PROVIDER_SITE_OTHER): Payer: Medicare HMO | Admitting: Ophthalmology

## 2017-07-22 DIAGNOSIS — H43813 Vitreous degeneration, bilateral: Secondary | ICD-10-CM

## 2017-07-22 DIAGNOSIS — D3131 Benign neoplasm of right choroid: Secondary | ICD-10-CM | POA: Diagnosis not present

## 2017-07-22 DIAGNOSIS — H34812 Central retinal vein occlusion, left eye, with macular edema: Secondary | ICD-10-CM | POA: Diagnosis not present

## 2017-07-22 DIAGNOSIS — H353132 Nonexudative age-related macular degeneration, bilateral, intermediate dry stage: Secondary | ICD-10-CM

## 2017-07-22 DIAGNOSIS — H35033 Hypertensive retinopathy, bilateral: Secondary | ICD-10-CM

## 2017-07-22 DIAGNOSIS — I10 Essential (primary) hypertension: Secondary | ICD-10-CM | POA: Diagnosis not present

## 2017-08-19 ENCOUNTER — Encounter (INDEPENDENT_AMBULATORY_CARE_PROVIDER_SITE_OTHER): Payer: Medicare HMO | Admitting: Ophthalmology

## 2017-08-19 DIAGNOSIS — H353132 Nonexudative age-related macular degeneration, bilateral, intermediate dry stage: Secondary | ICD-10-CM

## 2017-08-19 DIAGNOSIS — D3131 Benign neoplasm of right choroid: Secondary | ICD-10-CM | POA: Diagnosis not present

## 2017-08-19 DIAGNOSIS — H34812 Central retinal vein occlusion, left eye, with macular edema: Secondary | ICD-10-CM | POA: Diagnosis not present

## 2017-08-19 DIAGNOSIS — H43813 Vitreous degeneration, bilateral: Secondary | ICD-10-CM | POA: Diagnosis not present

## 2017-08-19 DIAGNOSIS — H35033 Hypertensive retinopathy, bilateral: Secondary | ICD-10-CM

## 2017-08-19 DIAGNOSIS — I10 Essential (primary) hypertension: Secondary | ICD-10-CM | POA: Diagnosis not present

## 2017-08-23 ENCOUNTER — Other Ambulatory Visit: Payer: Self-pay | Admitting: Cardiology

## 2017-09-03 ENCOUNTER — Other Ambulatory Visit: Payer: Self-pay | Admitting: Cardiology

## 2017-09-21 NOTE — Progress Notes (Signed)
Cardiology Office Note  Date: 09/22/2017   ID: KAHMARI KOLLER, DOB [birth year], MRN [id number]  PCP: Dione Housekeeper, MD  Primary Cardiologist: Rozann Lesches, MD   Chief Complaint  Patient presents with  . Coronary Artery Disease    History of Present Illness: [name] [name] is a 81 y.o. male last seen in May.  He presents for a routine follow-up visit.  Since last encounter he does not report any progressive angina symptoms, has used only one nitroglycerin. He has NYHA class II dyspnea with basic activities.  I reviewed his medications.  Current cardiac regimen includes Plavix, Norvasc, Lipitor, Coreg, lisinopril, and as needed nitroglycerin.  We will continue with observation, last ischemic testing was in 2013.  I reviewed interval ECG and lab work from September.  Past Medical History:  Diagnosis Date  . Arthritis   . Chronic constipation   . Coronary atherosclerosis of native coronary artery    Multivessel and bypass graft disease  . Diverticulosis 2007   Colonoscopy  . Essential hypertension, benign   . Fatty liver disease, nonalcoholic   . GERD (gastroesophageal reflux disease)   . Headache(784.0)   . Hiatal hernia   . Ischemic cardiomyopathy    LVEF 35%  . Mixed hyperlipidemia   . Myocardial infarction ([place])   . Nephrolithiasis   . Renal insufficiency   . Schatzki's ring    Last EGD/ED Dr Paul Half    Past Surgical History:  Procedure Laterality Date  . CORONARY ARTERY BYPASS GRAFT  2002   LIMA to LAD, SVG to diagonal and ramus, SVG to PDA and PLA  . HEMORRHOID SURGERY    . KIDNEY STONE SURGERY     right  . KNEE ARTHROSCOPY     left  . NEPHRECTOMY     left    Current Outpatient Medications  Medication Sig Dispense Refill  . acetaminophen (TYLENOL) 500 MG tablet Take 1,000 mg by mouth every 6 (six) hours as needed for mild pain or moderate pain.    Marland Kitchen amLODipine (NORVASC) 10 MG tablet TAKE 1 TABLET AT BEDTIME 90 tablet 3  . atorvastatin  (LIPITOR) 20 MG tablet TAKE 1 TABLET EVERY DAY 90 tablet 3  . carvedilol (COREG) 12.5 MG tablet TAKE 1 TABLET TWICE DAILY WITH MEALS 180 tablet 3  . cholecalciferol (VITAMIN D) 1000 units tablet Take 1,000 Units by mouth daily.    . clopidogrel (PLAVIX) 75 MG tablet TAKE 1 TABLET EVERY DAY 90 tablet 3  . dorzolamide-timolol (COSOPT) 22.3-6.8 MG/ML ophthalmic solution Place 1 drop into both eyes 2 (two) times daily.     . finasteride (PROSCAR) 5 MG tablet Take 5 mg by mouth daily.    Marland Kitchen latanoprost (XALATAN) 0.005 % ophthalmic solution Place 1 drop into both eyes at bedtime.    Marland Kitchen lisinopril (PRINIVIL,ZESTRIL) 30 MG tablet TAKE 1 TABLET EVERY DAY 90 tablet 3  . nitroGLYCERIN (NITROSTAT) 0.4 MG SL tablet Place 1 tablet (0.4 mg total) under the tongue every 5 (five) minutes as needed for chest pain. 25 tablet 3  . pantoprazole (PROTONIX) 40 MG tablet TAKE 1 TABLET EVERY DAY 90 tablet 3   No current facility-administered medications for this visit.    Allergies:  Amoxicillin   Social History: The patient  reports that he quit smoking about 56 years ago. His smoking use included cigarettes. He started smoking about 66 years ago. He has a 1.00 pack-year smoking history. His smokeless tobacco use includes chew. He reports that he does  not drink alcohol or use drugs.   ROS:  Please see the history of present illness. Otherwise, complete review of systems is positive for intermittent leg swelling.  All other systems are reviewed and negative.   Physical Exam: VS:  BP 108/62 (BP Location: Right Arm)   Pulse (!) 58   Ht 5' 3.5" (1.613 m)   Wt 196 lb (88.9 kg)   SpO2 96%   BMI 34.18 kg/m , BMI Body mass index is 34.18 kg/m.  Wt Readings from Last 3 Encounters:  09/22/17 196 lb (88.9 kg)  03/17/17 191 lb 12.8 oz (87 kg)  09/17/16 193 lb (87.5 kg)    General: Overweight male, chronically ill-appearing. HEENT: Conjunctiva and lids normal, oropharynx clear with poor dentition. Neck: Supple, no  elevated JVP or carotid bruits, no thyromegaly. Lungs: Decreased breath sounds without wheezing, nonlabored breathing at rest. Cardiac: Regular rate and rhythm, no S3 or significant systolic murmur, no pericardial rub. Abdomen: Soft, nontender, bowel sounds present, no guarding or rebound. Extremities: 1+ leg edema, distal pulses 2+.  ECG: I personally reviewed the tracing from 07/17/2017 which showed sinus rhythm with IVCD consistent with incomplete left bundle branch block.  Recent Labwork: 07/17/2017: ALT 21; AST 23; BUN 15; Creatinine, Ser 1.38; Hemoglobin 14.3; Platelets 171; Potassium 4.2; Sodium 139   Other Studies Reviewed Today:  Lexiscan Myoview August 2013: Overall low risk, equivocal ST segment changes, scar involving the apex and inferior wall without any large residual ischemia, LVEF 56% with inferior hypokinesis.  Assessment and Plan:  1.  CAD status post CABG in 2002 with graft disease.  He reports no progressive angina on medical therapy with low risk Myoview in 2013 showing evidence of inferior scar but no ischemia.  With no changes were made today.  2.  Essential hypertension, blood pressure is well controlled today.  3.  Mixed hyperlipidemia, continues on Lipitor with follow-up per Dr. Edrick Oh.  Current medicines were reviewed with the patient today.  Disposition: Follow-up in 6 months.  Signed, Satira Sark, MD, [facility] 09/22/2017 11:19 AM    [facility] [address] [place], [place] [address] Phone: [phone]; Fax: [phone]

## 2017-09-22 ENCOUNTER — Ambulatory Visit: Payer: Medicare HMO | Admitting: Cardiology

## 2017-09-22 ENCOUNTER — Encounter: Payer: Self-pay | Admitting: Cardiology

## 2017-09-22 VITALS — BP 108/62 | HR 58 | Ht 63.5 in | Wt 196.0 lb

## 2017-09-22 DIAGNOSIS — I25119 Atherosclerotic heart disease of native coronary artery with unspecified angina pectoris: Secondary | ICD-10-CM

## 2017-09-22 DIAGNOSIS — E782 Mixed hyperlipidemia: Secondary | ICD-10-CM

## 2017-09-22 DIAGNOSIS — I10 Essential (primary) hypertension: Secondary | ICD-10-CM

## 2017-09-22 NOTE — Patient Instructions (Signed)

## 2017-09-23 ENCOUNTER — Encounter (INDEPENDENT_AMBULATORY_CARE_PROVIDER_SITE_OTHER): Payer: Medicare HMO | Admitting: Ophthalmology

## 2017-09-23 DIAGNOSIS — H353132 Nonexudative age-related macular degeneration, bilateral, intermediate dry stage: Secondary | ICD-10-CM | POA: Diagnosis not present

## 2017-09-23 DIAGNOSIS — H43813 Vitreous degeneration, bilateral: Secondary | ICD-10-CM | POA: Diagnosis not present

## 2017-09-23 DIAGNOSIS — H35033 Hypertensive retinopathy, bilateral: Secondary | ICD-10-CM

## 2017-09-23 DIAGNOSIS — H34812 Central retinal vein occlusion, left eye, with macular edema: Secondary | ICD-10-CM

## 2017-09-23 DIAGNOSIS — I10 Essential (primary) hypertension: Secondary | ICD-10-CM

## 2017-10-28 ENCOUNTER — Encounter (INDEPENDENT_AMBULATORY_CARE_PROVIDER_SITE_OTHER): Payer: Medicare HMO | Admitting: Ophthalmology

## 2017-10-28 DIAGNOSIS — H34812 Central retinal vein occlusion, left eye, with macular edema: Secondary | ICD-10-CM

## 2017-10-28 DIAGNOSIS — H43813 Vitreous degeneration, bilateral: Secondary | ICD-10-CM

## 2017-10-28 DIAGNOSIS — I10 Essential (primary) hypertension: Secondary | ICD-10-CM | POA: Diagnosis not present

## 2017-10-28 DIAGNOSIS — H353132 Nonexudative age-related macular degeneration, bilateral, intermediate dry stage: Secondary | ICD-10-CM | POA: Diagnosis not present

## 2017-10-28 DIAGNOSIS — H35033 Hypertensive retinopathy, bilateral: Secondary | ICD-10-CM | POA: Diagnosis not present

## 2017-10-28 DIAGNOSIS — D3131 Benign neoplasm of right choroid: Secondary | ICD-10-CM

## 2017-11-29 ENCOUNTER — Encounter (INDEPENDENT_AMBULATORY_CARE_PROVIDER_SITE_OTHER): Payer: Medicare HMO | Admitting: Ophthalmology

## 2017-11-29 DIAGNOSIS — H34812 Central retinal vein occlusion, left eye, with macular edema: Secondary | ICD-10-CM | POA: Diagnosis not present

## 2017-11-29 DIAGNOSIS — D3131 Benign neoplasm of right choroid: Secondary | ICD-10-CM

## 2017-11-29 DIAGNOSIS — I10 Essential (primary) hypertension: Secondary | ICD-10-CM | POA: Diagnosis not present

## 2017-11-29 DIAGNOSIS — H35033 Hypertensive retinopathy, bilateral: Secondary | ICD-10-CM | POA: Diagnosis not present

## 2017-11-29 DIAGNOSIS — H43813 Vitreous degeneration, bilateral: Secondary | ICD-10-CM | POA: Diagnosis not present

## 2017-11-29 DIAGNOSIS — H353132 Nonexudative age-related macular degeneration, bilateral, intermediate dry stage: Secondary | ICD-10-CM

## 2017-11-30 ENCOUNTER — Encounter (INDEPENDENT_AMBULATORY_CARE_PROVIDER_SITE_OTHER): Payer: Medicare HMO | Admitting: Ophthalmology

## 2018-01-06 ENCOUNTER — Encounter (INDEPENDENT_AMBULATORY_CARE_PROVIDER_SITE_OTHER): Payer: Medicare HMO | Admitting: Ophthalmology

## 2018-01-06 DIAGNOSIS — H34812 Central retinal vein occlusion, left eye, with macular edema: Secondary | ICD-10-CM

## 2018-01-06 DIAGNOSIS — H4313 Vitreous hemorrhage, bilateral: Secondary | ICD-10-CM

## 2018-01-06 DIAGNOSIS — H353132 Nonexudative age-related macular degeneration, bilateral, intermediate dry stage: Secondary | ICD-10-CM | POA: Diagnosis not present

## 2018-01-06 DIAGNOSIS — H35033 Hypertensive retinopathy, bilateral: Secondary | ICD-10-CM | POA: Diagnosis not present

## 2018-01-06 DIAGNOSIS — I10 Essential (primary) hypertension: Secondary | ICD-10-CM

## 2018-01-21 ENCOUNTER — Telehealth: Payer: Self-pay

## 2018-01-21 NOTE — Telephone Encounter (Signed)
Patient called to say he had not used NTG in months and now has used one NTG daily for the last 3 days.No pain now, scheduled apt with brittany strader PA-C on Wednesday 3/20 at 3:30 pm.Pt knows if he uses 3 NTG and still having CP to go to the ED

## 2018-01-25 ENCOUNTER — Ambulatory Visit: Payer: Medicare HMO | Admitting: Urology

## 2018-01-25 DIAGNOSIS — N401 Enlarged prostate with lower urinary tract symptoms: Secondary | ICD-10-CM

## 2018-01-25 DIAGNOSIS — R972 Elevated prostate specific antigen [PSA]: Secondary | ICD-10-CM

## 2018-01-26 ENCOUNTER — Other Ambulatory Visit: Payer: Self-pay | Admitting: Cardiology

## 2018-01-26 ENCOUNTER — Encounter: Payer: Self-pay | Admitting: *Deleted

## 2018-01-26 ENCOUNTER — Encounter: Payer: Self-pay | Admitting: Student

## 2018-01-26 ENCOUNTER — Ambulatory Visit: Payer: Medicare HMO | Admitting: Student

## 2018-01-26 VITALS — BP 124/64 | HR 59 | Ht 64.0 in | Wt 193.0 lb

## 2018-01-26 DIAGNOSIS — I25118 Atherosclerotic heart disease of native coronary artery with other forms of angina pectoris: Secondary | ICD-10-CM | POA: Diagnosis not present

## 2018-01-26 DIAGNOSIS — R079 Chest pain, unspecified: Secondary | ICD-10-CM

## 2018-01-26 DIAGNOSIS — I10 Essential (primary) hypertension: Secondary | ICD-10-CM

## 2018-01-26 DIAGNOSIS — E782 Mixed hyperlipidemia: Secondary | ICD-10-CM

## 2018-01-26 MED ORDER — NITROGLYCERIN 0.4 MG SL SUBL: 0.4000 mg | SUBLINGUAL_TABLET | SUBLINGUAL | 3 refills | Status: DC | PRN

## 2018-01-26 NOTE — Progress Notes (Signed)
Cardiology Office Note    Date:  01/26/2018   ID:  [name] [name], DOB 19-May-1936, MRN [id number]  PCP:  Dione Housekeeper, MD  Cardiologist: Rozann Lesches, MD    Chief Complaint  Patient presents with  . Follow-up    recent chest pain    History of Present Illness:    [name] [name] is a 82 y.o. male with past medical history of CAD (s/p CABG in 2002 with low-risk NST in 2013), HTN, HLD, and GERD who presents to the office today for evaluation of chest pain.   Was recently examined he was last examined by Dr. Domenic Polite in 09/2017 and denied any recent chest pain or dyspnea on exertion at that time. He was continued on his current medication regimen.  The patient called the office on 01/21/2018 reporting he had used SL NTG daily for the past 3 days, therefore close follow-up was recommended.  In talking with the patient today, he reports having multiple episodes of chest pain over the past week. The first occurred while he was consuming fried chicken at [employer] and resolved with sublingual nitroglycerin. The second episode occurred when he was climbing a hill near his home and resolved with rest. Reports the pain lasted for about 15 to 20 minutes at that time. He did note a repeat episode two nights ago which awoke him from sleep but resolved with Tums.  He denies any associated dyspnea, orthopnea, PND, lower extremity edema, or palpitations. He reports good compliance with his medication regimen and denies any recent changes to this. He has remained on Protonix 40 mg daily.   Past Medical History:  Diagnosis Date  . Arthritis   . Chronic constipation   . Coronary atherosclerosis of native coronary artery    a. s/p CABG in 2002 b. low-risk NST in 2013  . Diverticulosis 2007   Colonoscopy  . Essential hypertension, benign   . Fatty liver disease, nonalcoholic   . GERD (gastroesophageal reflux disease)   . Headache(784.0)   . Hiatal hernia   . Ischemic cardiomyopathy    a. LVEF previously reduced to 35% with normalization since by review of prior notes  . Mixed hyperlipidemia   . Myocardial infarction ([place])   . Nephrolithiasis   . Renal insufficiency   . Schatzki's ring    Last EGD/ED Dr Paul Half    Past Surgical History:  Procedure Laterality Date  . CORONARY ARTERY BYPASS GRAFT  2002   LIMA to LAD, SVG to diagonal and ramus, SVG to PDA and PLA  . HEMORRHOID SURGERY    . KIDNEY STONE SURGERY     right  . KNEE ARTHROSCOPY     left  . NEPHRECTOMY     left  . TOTAL KNEE ARTHROPLASTY  08/01/2012   Procedure: TOTAL KNEE ARTHROPLASTY;  Surgeon: Tobi Bastos, MD;  Location: [facility];  Service: Orthopedics;  Laterality: Left;    Current Medications: Outpatient Medications Prior to Visit  Medication Sig Dispense Refill  . acetaminophen (TYLENOL) 500 MG tablet Take 1,000 mg by mouth every 6 (six) hours as needed for mild pain or moderate pain.    Marland Kitchen amLODipine (NORVASC) 10 MG tablet TAKE 1 TABLET AT BEDTIME 90 tablet 3  . atorvastatin (LIPITOR) 20 MG tablet TAKE 1 TABLET EVERY DAY 90 tablet 3  . brimonidine (ALPHAGAN) 0.2 % ophthalmic solution INSTILL 1 DROP INTO RIGHT EYE TWICE A DAY  3  . carvedilol (COREG) 12.5 MG tablet TAKE 1 TABLET TWICE DAILY  WITH MEALS 180 tablet 3  . cholecalciferol (VITAMIN D) 1000 units tablet Take 1,000 Units by mouth daily.    . clopidogrel (PLAVIX) 75 MG tablet TAKE 1 TABLET EVERY DAY 90 tablet 3  . dorzolamide (TRUSOPT) 2 % ophthalmic solution Apply to eye.    . dorzolamide-timolol (COSOPT) 22.3-6.8 MG/ML ophthalmic solution Place 1 drop into both eyes 2 (two) times daily.     . finasteride (PROSCAR) 5 MG tablet Take 5 mg by mouth daily.    Marland Kitchen latanoprost (XALATAN) 0.005 % ophthalmic solution Place 1 drop into both eyes at bedtime.    Marland Kitchen lisinopril (PRINIVIL,ZESTRIL) 30 MG tablet TAKE 1 TABLET EVERY DAY 90 tablet 3  . pantoprazole (PROTONIX) 40 MG tablet TAKE 1 TABLET EVERY DAY 90 tablet 3  . nitroGLYCERIN (NITROSTAT)  0.4 MG SL tablet Place 1 tablet (0.4 mg total) under the tongue every 5 (five) minutes as needed for chest pain. 25 tablet 3   No facility-administered medications prior to visit.      Allergies:   Amoxicillin   Social History   Socioeconomic History  . Marital status: Married    Spouse name: None  . Number of children: 6  . Years of education: None  . Highest education level: None  Social Needs  . Financial resource strain: None  . Food insecurity - worry: None  . Food insecurity - inability: None  . Transportation needs - medical: None  . Transportation needs - non-medical: None  Occupational History  . Occupation: Retired    Comment: Scientific laboratory technician  Tobacco Use  . Smoking status: Former Smoker    Packs/day: 0.50    Years: 2.00    Pack years: 1.00    Types: Cigarettes    Start date: 01/07/1951    Last attempt to quit: 05/20/1961    Years since quitting: 56.7  . Smokeless tobacco: Current User    Types: Chew  . Tobacco comment: quit 50 yrs ago  Substance and Sexual Activity  . Alcohol use: No    Alcohol/week: 0.0 oz  . Drug use: No  . Sexual activity: None  Other Topics Concern  . None  Social History Narrative  . None     Family History:  The patient's family history includes Coronary artery disease in his father.   Review of Systems:   Please see the history of present illness.     General:  No chills, fever, night sweats or weight changes.  Cardiovascular:  No dyspnea on exertion, edema, orthopnea, palpitations, paroxysmal nocturnal dyspnea. Positive for chest pain.  Dermatological: No rash, lesions/masses Respiratory: No cough, dyspnea Urologic: No hematuria, dysuria Abdominal:   No nausea, vomiting, diarrhea, bright red blood per rectum, melena, or hematemesis Neurologic:  No visual changes, wkns, changes in mental status. All other systems reviewed and are otherwise negative except as noted above.   Physical Exam:    VS:  BP 124/64 (BP  Location: Right Arm)   Pulse (!) 59   Ht 5\' 4"  (1.626 m)   Wt 193 lb (87.5 kg)   SpO2 97%   BMI 33.13 kg/m    General: Well developed, well nourished elderly Caucasian male appearing in no acute distress. Head: Normocephalic, atraumatic, sclera non-icteric, no xanthomas, nares are without discharge.  Neck: No carotid bruits. JVD not elevated.  Lungs: Respirations regular and unlabored, without wheezes or rales.  Heart: Regular rate and rhythm. No S3 or S4.  No murmur, no rubs, or gallops appreciated. Abdomen: Soft,  non-tender, non-distended with normoactive bowel sounds. No hepatomegaly. No rebound/guarding. No obvious abdominal masses. Msk:  Strength and tone appear normal for age. No joint deformities or effusions. Extremities: No clubbing or cyanosis. No lower extremity edema.  Distal pedal pulses are 2+ bilaterally. Neuro: Alert and oriented X 3. Moves all extremities spontaneously. No focal deficits noted. Psych:  Responds to questions appropriately with a normal affect. Skin: No rashes or lesions noted  Wt Readings from Last 3 Encounters:  01/26/18 193 lb (87.5 kg)  09/22/17 196 lb (88.9 kg)  03/17/17 191 lb 12.8 oz (87 kg)      Studies/Labs Reviewed:   EKG:  EKG is ordered today.  The ekg ordered today demonstrates sinus bradycardia, HR 50, with known LBBB.   Recent Labs: 07/17/2017: ALT 21; BUN 15; Creatinine, Ser 1.38; Hemoglobin 14.3; Platelets 171; Potassium 4.2; Sodium 139   Lipid Panel No results found for: CHOL, TRIG, HDL, CHOLHDL, VLDL, LDLCALC, LDLDIRECT  Additional studies/ records that were reviewed today include:   NST: 06/2012 IMPRESSION: Abnormal, but relatively low risk Myoview.  There were equivocal ST- segment changes, no arrhythmias.  Perfusion imaging shows evidence of probable scar affecting the apex and inferior wall, overall no large residual ischemia.  T I D ratio is normal 1.0, and LVEF is 56% with inferior hypokinesis.  Assessment:    1.  Chest pain, unspecified type   2. Coronary artery disease involving native coronary artery of native heart with other form of angina pectoris ([place])   3. Essential hypertension   4. Mixed hyperlipidemia      Plan:   In order of problems listed above:  1. Chest Pain with Mixed Typical and Atypical Features/ CAD - the patient is s/p CABG in 2002 with his most recent ischemic evaluation being a low-risk NST in 2013. He reports episodes of chest pain over the past week which has mixed features as some episodes occurred in the setting of food consumption while others occurred with exertional activity and climbing hills as outlined above. Have been relieved by both TUMS and SL NTG at different times. - with his new symptoms and in the setting of known CAD, would plan for a repeat Lexiscan Myoview to assess for ischemia. Will provide with updated Rx for SL NTG. Continue Plavix, BB, and statin therapy. Will remain on Protonix for GERD.   2. HTN - BP is well-controlled at 124/64 during today's visit. - continue Amlodipine 10mg  daily, Coreg 12.5mg  BID, and Lisinopril 30mg  daily.   3. HLD - followed by his PCP. Remains on Atorvastatin 20mg  daily with goal LDL < 70 with known CAD.   Medication Adjustments/Labs and Tests Ordered: Current medicines are reviewed at length with the patient today.  Concerns regarding medicines are outlined above.  Medication changes, Labs and Tests ordered today are listed in the Patient Instructions below. Patient Instructions  Medication Instructions:  Your physician recommends that you continue on your current medications as directed. Please refer to the Current Medication list given to you today.   Labwork: NONE   Testing/Procedures: Your physician has requested that you have a lexiscan myoview. For further information please visit [email or web address]. Please follow instruction sheet, as given.  Follow-Up: Your physician recommends that you schedule a follow-up  appointment in: 2 Months with Dr. Domenic Polite  Any Other Special Instructions Will Be Listed Below (If Applicable).  If you need a refill on your cardiac medications before your next appointment, please call your pharmacy. Thank you for choosing [facility]!     Signed, Erma Heritage, PA-C  01/26/2018 8:54 PM    [facility] [address] [place], [place] [address] Phone: [phone]

## 2018-01-26 NOTE — Patient Instructions (Signed)
Medication Instructions:  Your physician recommends that you continue on your current medications as directed. Please refer to the Current Medication list given to you today.   Labwork: NONE   Testing/Procedures: Your physician has requested that you have a lexiscan myoview. For further information please visit [email or web address]. Please follow instruction sheet, as given.    Follow-Up: Your physician recommends that you schedule a follow-up appointment in: 2 Months with Dr. Domenic Polite   Any Other Special Instructions Will Be Listed Below (If Applicable).     If you need a refill on your cardiac medications before your next appointment, please call your pharmacy. Thank you for choosing [facility]!

## 2018-02-04 ENCOUNTER — Other Ambulatory Visit (HOSPITAL_COMMUNITY): Payer: Medicare HMO

## 2018-02-04 ENCOUNTER — Ambulatory Visit (HOSPITAL_COMMUNITY): Payer: Medicare HMO

## 2018-02-10 ENCOUNTER — Encounter (INDEPENDENT_AMBULATORY_CARE_PROVIDER_SITE_OTHER): Payer: Medicare HMO | Admitting: Ophthalmology

## 2018-02-10 DIAGNOSIS — D3131 Benign neoplasm of right choroid: Secondary | ICD-10-CM

## 2018-02-10 DIAGNOSIS — H353112 Nonexudative age-related macular degeneration, right eye, intermediate dry stage: Secondary | ICD-10-CM

## 2018-02-10 DIAGNOSIS — H35033 Hypertensive retinopathy, bilateral: Secondary | ICD-10-CM

## 2018-02-10 DIAGNOSIS — I10 Essential (primary) hypertension: Secondary | ICD-10-CM | POA: Diagnosis not present

## 2018-02-10 DIAGNOSIS — H34812 Central retinal vein occlusion, left eye, with macular edema: Secondary | ICD-10-CM

## 2018-02-10 DIAGNOSIS — H43813 Vitreous degeneration, bilateral: Secondary | ICD-10-CM

## 2018-02-21 ENCOUNTER — Encounter (HOSPITAL_COMMUNITY)
Admission: RE | Admit: 2018-02-21 | Discharge: 2018-02-21 | Disposition: A | Discharge status: Home / Self Care | Payer: Medicare HMO | Source: Ambulatory Visit | Attending: Student | Admitting: Student

## 2018-02-21 ENCOUNTER — Encounter (HOSPITAL_BASED_OUTPATIENT_CLINIC_OR_DEPARTMENT_OTHER)
Admission: RE | Admit: 2018-02-21 | Discharge: 2018-02-21 | Disposition: A | Discharge status: Home / Self Care | Payer: Medicare HMO | Source: Ambulatory Visit | Attending: Student | Admitting: Student

## 2018-02-21 ENCOUNTER — Encounter (HOSPITAL_COMMUNITY): Payer: Self-pay

## 2018-02-21 DIAGNOSIS — R079 Chest pain, unspecified: Secondary | ICD-10-CM | POA: Diagnosis not present

## 2018-02-21 HISTORY — DX: Heart failure, unspecified: I50.9

## 2018-02-21 LAB — NM MYOCAR MULTI W/SPECT W/WALL MOTION / EF
CHL CUP NUCLEAR SRS: 5
CHL CUP NUCLEAR SSS: 8
CHL CUP RESTING HR STRESS: 44 {beats}/min
CSEPPHR: 67 {beats}/min
LV sys vol: 54 mL
LVDIAVOL: 103 mL (ref 62–150)
RATE: 0.39
SDS: 3
TID: 0.92

## 2018-02-21 MED ORDER — SODIUM CHLORIDE 0.9% FLUSH
INTRAVENOUS | Status: AC
  Administered 2018-02-21: 10 mL via INTRAVENOUS
  Filled 2018-02-21: qty 10

## 2018-02-21 MED ORDER — REGADENOSON 0.4 MG/5ML IV SOLN
INTRAVENOUS | Status: AC
  Administered 2018-02-21: 0.4 mg via INTRAVENOUS
  Filled 2018-02-21: qty 5

## 2018-02-21 MED ORDER — TECHNETIUM TC 99M TETROFOSMIN IV KIT
10.0000 | PACK | Freq: Once | INTRAVENOUS | Status: AC | PRN
  Administered 2018-02-21: 10.3 via INTRAVENOUS

## 2018-02-21 MED ORDER — TECHNETIUM TC 99M TETROFOSMIN IV KIT
30.0000 | PACK | Freq: Once | INTRAVENOUS | Status: AC | PRN
  Administered 2018-02-21: 29 via INTRAVENOUS

## 2018-02-25 IMAGING — DX DG LUMBAR SPINE COMPLETE 4+V
5 series · 5 of 5 positions shown · non-contrast
Comparison: None.

CLINICAL DATA: 80-year-old male with history of trauma from a fall
this morning. Back pain.

EXAM:
LUMBAR SPINE - COMPLETE 4+ VIEW

[l-spine ap]
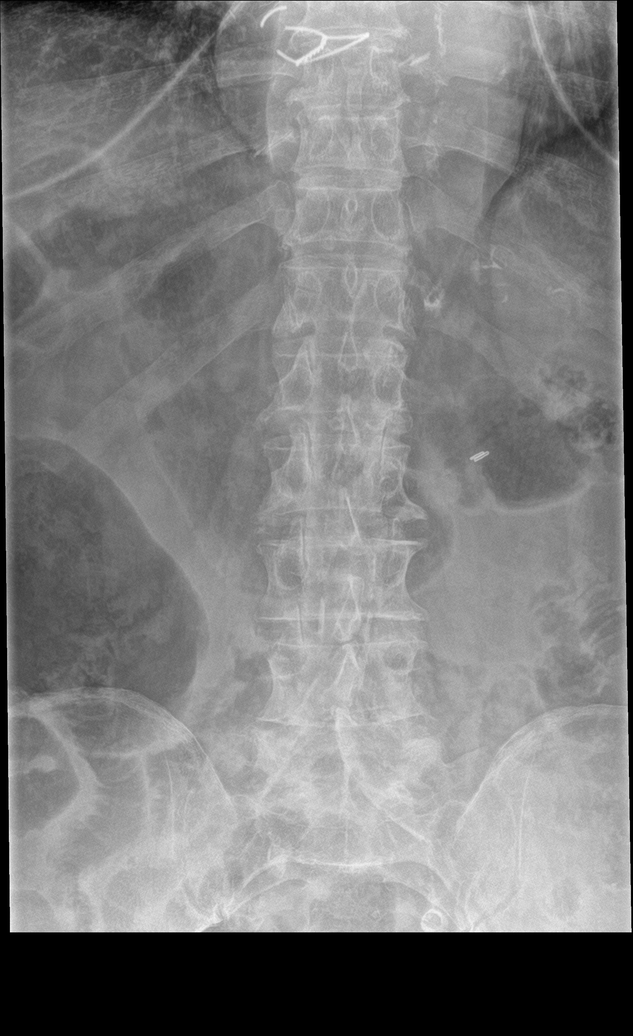

[l-spine obl (1 of 2)]
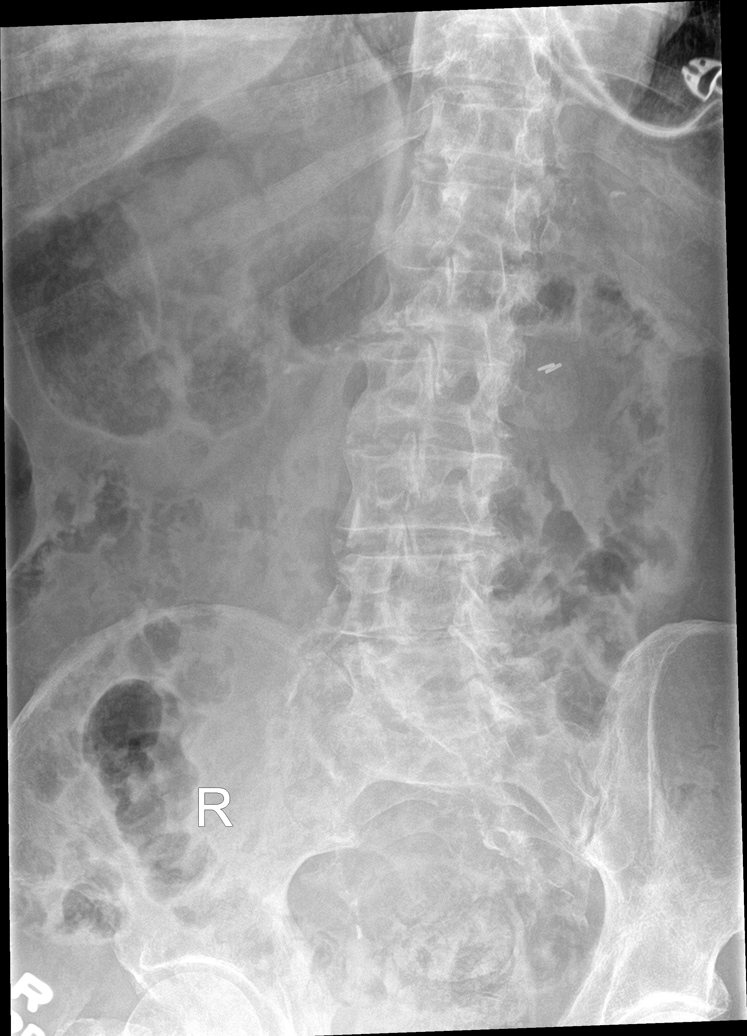

[l-spine obl (2 of 2)]
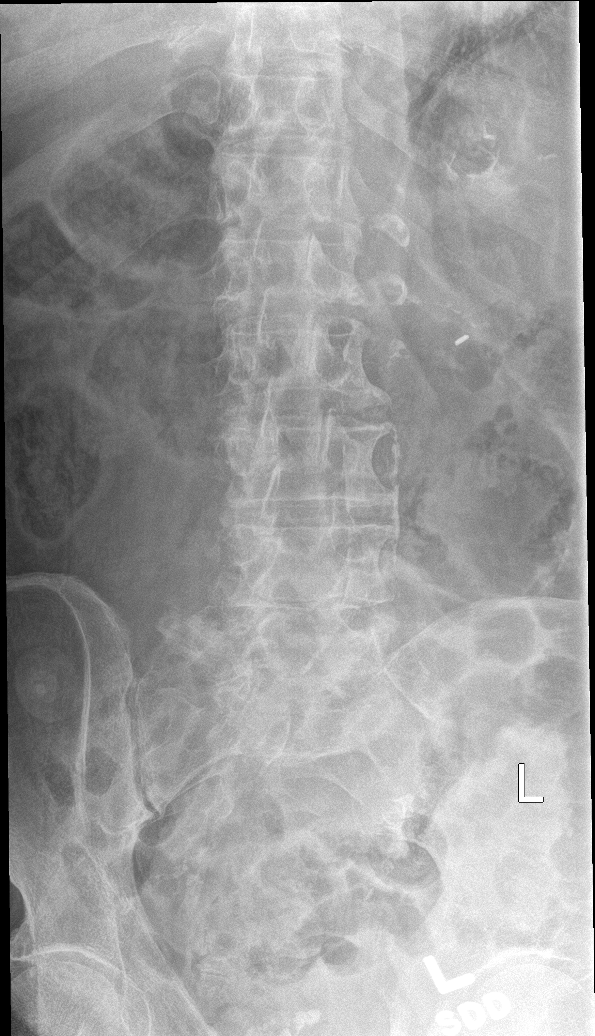

[l-spine lat]
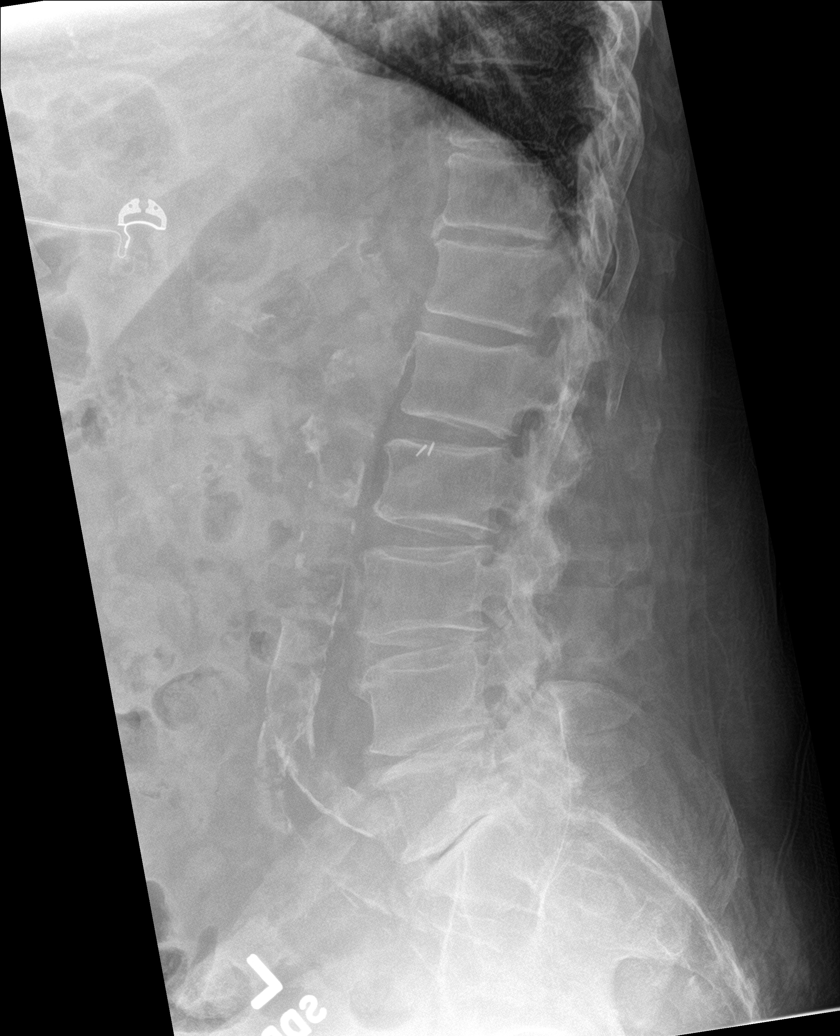

[l-spine spot]
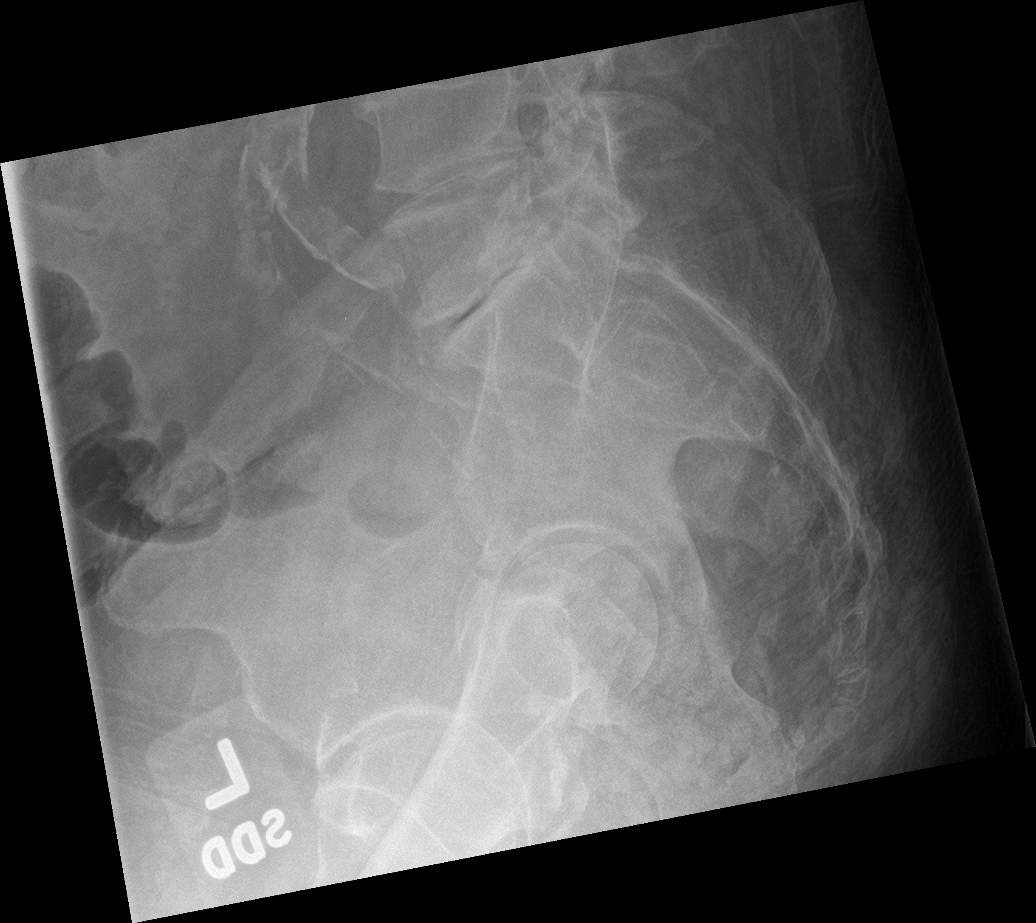

[5 of 5 positions shown; findings below may reference images not displayed]

FINDINGS: No acute displaced fracture or compression type fracture noted.
Alignment is anatomic. No defects of the pars interarticularis.
Multilevel degenerative disc disease and facet arthropathy, most
severe at L4-L5 and L5-S1. Aortic atherosclerosis.
IMPRESSION: 1. No acute radiographic abnormality of the lumbar spine.
2. Multilevel degenerative disc disease and lumbar spondylosis, as
above.
3. Aortic atherosclerosis.
Aortic Atherosclerosis ([id number]-[id number]).

## 2018-03-24 ENCOUNTER — Encounter (INDEPENDENT_AMBULATORY_CARE_PROVIDER_SITE_OTHER): Payer: Medicare HMO | Admitting: Ophthalmology

## 2018-03-24 DIAGNOSIS — H34812 Central retinal vein occlusion, left eye, with macular edema: Secondary | ICD-10-CM

## 2018-03-24 DIAGNOSIS — H35033 Hypertensive retinopathy, bilateral: Secondary | ICD-10-CM | POA: Diagnosis not present

## 2018-03-24 DIAGNOSIS — I10 Essential (primary) hypertension: Secondary | ICD-10-CM

## 2018-03-24 DIAGNOSIS — H353112 Nonexudative age-related macular degeneration, right eye, intermediate dry stage: Secondary | ICD-10-CM

## 2018-03-24 DIAGNOSIS — H43813 Vitreous degeneration, bilateral: Secondary | ICD-10-CM | POA: Diagnosis not present

## 2018-03-24 DIAGNOSIS — D3131 Benign neoplasm of right choroid: Secondary | ICD-10-CM | POA: Diagnosis not present

## 2018-04-05 NOTE — Progress Notes (Signed)
Cardiology Office Note  Date: 04/06/2018   ID: [name] [name], DOB 26-May-1936, MRN [id number]  PCP: Dione Housekeeper, MD  Primary Cardiologist: Rozann Lesches, MD   Chief Complaint  Patient presents with  . Coronary Artery Disease    History of Present Illness: [name] [name] is a 82 y.o. male last seen in March by Ms. Strader PA-C.  He was reporting increasing episodes of chest pain and nitroglycerin use at that time, referred for follow-up ischemic evaluation.  Lexiscan Myoview in April demonstrated probable scar and soft tissue attenuation affecting the inferior/apical walls but no active ischemia and LVEF 48%.  Medical therapy was recommended.  He presents today for follow-up.  States that the symptoms have improved, he has not required nitroglycerin recently.  We did discuss the possibility of adding M. Doerr as a next step.  Current cardiac regimen includes Plavix, Norvasc, Lipitor, Coreg, lisinopril, and as needed nitroglycerin.  He also mentions trouble with dysphasia and reflux, previously followed by Dr. Gala Romney and has undergone esophageal dilatation.  He is on Protonix.  Past Medical History:  Diagnosis Date  . Arthritis   . CHF (congestive heart failure) ([place])   . Chronic constipation   . Coronary atherosclerosis of native coronary artery    a. s/p CABG in 2002 b. low-risk NST in 2013  . Diverticulosis 2007   Colonoscopy  . Essential hypertension, benign   . Fatty liver disease, nonalcoholic   . GERD (gastroesophageal reflux disease)   . Headache(784.0)   . Hiatal hernia   . Ischemic cardiomyopathy    a. LVEF previously reduced to 35% with normalization since by review of prior notes  . Mixed hyperlipidemia   . Myocardial infarction ([place])   . Nephrolithiasis   . Renal insufficiency   . Schatzki's ring    Last EGD/ED Dr Paul Half    Past Surgical History:  Procedure Laterality Date  . CORONARY ARTERY BYPASS GRAFT  2002   LIMA to LAD, SVG to  diagonal and ramus, SVG to PDA and PLA  . HEMORRHOID SURGERY    . KIDNEY STONE SURGERY     right  . KNEE ARTHROSCOPY     left  . NEPHRECTOMY     left  . TOTAL KNEE ARTHROPLASTY  08/01/2012   Procedure: TOTAL KNEE ARTHROPLASTY;  Surgeon: Tobi Bastos, MD;  Location: [facility];  Service: Orthopedics;  Laterality: Left;    Current Outpatient Medications  Medication Sig Dispense Refill  . acetaminophen (TYLENOL) 500 MG tablet Take 1,000 mg by mouth every 6 (six) hours as needed for mild pain or moderate pain.    Marland Kitchen amLODipine (NORVASC) 10 MG tablet TAKE 1 TABLET AT BEDTIME 90 tablet 3  . atorvastatin (LIPITOR) 20 MG tablet TAKE 1 TABLET EVERY DAY 90 tablet 3  . brimonidine (ALPHAGAN) 0.2 % ophthalmic solution INSTILL 1 DROP INTO RIGHT EYE TWICE A DAY  3  . carvedilol (COREG) 12.5 MG tablet TAKE 1 TABLET TWICE DAILY WITH MEALS 180 tablet 3  . cholecalciferol (VITAMIN D) 1000 units tablet Take 1,000 Units by mouth daily.    . clopidogrel (PLAVIX) 75 MG tablet TAKE 1 TABLET EVERY DAY 90 tablet 3  . dorzolamide (TRUSOPT) 2 % ophthalmic solution Apply to eye.    . dorzolamide-timolol (COSOPT) 22.3-6.8 MG/ML ophthalmic solution Place 1 drop into both eyes 2 (two) times daily.     . finasteride (PROSCAR) 5 MG tablet Take 5 mg by mouth daily.    Marland Kitchen latanoprost (XALATAN)  0.005 % ophthalmic solution Place 1 drop into both eyes at bedtime.    Marland Kitchen lisinopril (PRINIVIL,ZESTRIL) 30 MG tablet TAKE 1 TABLET EVERY DAY 90 tablet 3  . nitroGLYCERIN (NITROSTAT) 0.4 MG SL tablet Place 1 tablet (0.4 mg total) under the tongue every 5 (five) minutes as needed for chest pain. 25 tablet 3  . pantoprazole (PROTONIX) 40 MG tablet TAKE 1 TABLET EVERY DAY 90 tablet 3   No current facility-administered medications for this visit.    Allergies:  Amoxicillin   Social History: The patient  reports that he quit smoking about 56 years ago. His smoking use included cigarettes. He started smoking about 67 years ago. He has a  1.00 pack-year smoking history. His smokeless tobacco use includes chew. He reports that he does not drink alcohol or use drugs.   ROS:  Please see the history of present illness. Otherwise, complete review of systems is positive for family stressors.  All other systems are reviewed and negative.   Physical Exam: VS:  BP 128/68 (BP Location: Left Arm)   Pulse 64   Ht 5\' 3"  (1.6 m)   Wt 191 lb (86.6 kg)   SpO2 98%   BMI 33.83 kg/m , BMI Body mass index is 33.83 kg/m.  Wt Readings from Last 3 Encounters:  04/06/18 191 lb (86.6 kg)  01/26/18 193 lb (87.5 kg)  09/22/17 196 lb (88.9 kg)    General: Elderly male, no distress. HEENT: Conjunctiva and lids normal, oropharynx clear. Neck: Supple, no elevated JVP or carotid bruits, no thyromegaly. Lungs: Diminished breath sounds without wheezing, nonlabored breathing at rest. Cardiac: Regular rate and rhythm, no S3 or significant systolic murmur, no pericardial rub. Abdomen: Soft, nontender, bowel sounds present. Extremities: Mild ankle edema, distal pulses 2+. Skin: Warm and dry. Musculoskeletal: No kyphosis. Neuropsychiatric: Alert and oriented x3, affect grossly appropriate.  ECG: I personally reviewed the tracing from 01/26/2018 which showed sinus rhythm with left bundle branch block.  Recent Labwork: 07/17/2017: ALT 21; AST 23; BUN 15; Creatinine, Ser 1.38; Hemoglobin 14.3; Platelets 171; Potassium 4.2; Sodium 139   Other Studies Reviewed Today:  Lexiscan Myoview 02/21/2018:  [place]in[place] with decreased tracer activity in the inferior and apical walls consistent with probable scar and soft tissue attenuation (diaphragm) No ischemia  This is an intermediate risk study.  Nuclear stress EF: 48%.  Assessment and Plan:  1.  CAD status post CABG in 2002 with graft disease being managed medically at this point.  Recent Myoview study was graded at intermediate risk, although LVEF was 48% and there was no evidence of ischemia with probable  scar in the inferior apical distribution (low risk).  In the absence of progressive angina I would continue with current medical regimen.  Adding Imdur would be a next step.  2.  Essential hypertension, blood pressure control is adequate today.  3.  Mixed hyperlipidemia, on Lipitor.  He continues to follow with Dr. Elwanda Brooklyn.  Current medicines were reviewed with the patient today.  Disposition: Follow-up in 6 months.  Signed, Satira Sark, MD, [facility] 04/06/2018 1:21 PM    [facility] at [facility] [address] [place], [place] [address] Phone: [phone]; Fax: [phone]

## 2018-04-06 ENCOUNTER — Ambulatory Visit (INDEPENDENT_AMBULATORY_CARE_PROVIDER_SITE_OTHER): Payer: Medicare HMO | Admitting: Cardiology

## 2018-04-06 ENCOUNTER — Encounter: Payer: Self-pay | Admitting: Cardiology

## 2018-04-06 VITALS — BP 128/68 | HR 64 | Ht 63.0 in | Wt 191.0 lb

## 2018-04-06 DIAGNOSIS — I25119 Atherosclerotic heart disease of native coronary artery with unspecified angina pectoris: Secondary | ICD-10-CM | POA: Diagnosis not present

## 2018-04-06 DIAGNOSIS — I10 Essential (primary) hypertension: Secondary | ICD-10-CM | POA: Diagnosis not present

## 2018-04-06 DIAGNOSIS — E782 Mixed hyperlipidemia: Secondary | ICD-10-CM

## 2018-04-06 NOTE — Patient Instructions (Signed)

## 2018-04-28 ENCOUNTER — Encounter (INDEPENDENT_AMBULATORY_CARE_PROVIDER_SITE_OTHER): Payer: Medicare HMO | Admitting: Ophthalmology

## 2018-04-28 DIAGNOSIS — I10 Essential (primary) hypertension: Secondary | ICD-10-CM

## 2018-04-28 DIAGNOSIS — H34812 Central retinal vein occlusion, left eye, with macular edema: Secondary | ICD-10-CM | POA: Diagnosis not present

## 2018-04-28 DIAGNOSIS — D3131 Benign neoplasm of right choroid: Secondary | ICD-10-CM | POA: Diagnosis not present

## 2018-04-28 DIAGNOSIS — H353112 Nonexudative age-related macular degeneration, right eye, intermediate dry stage: Secondary | ICD-10-CM | POA: Diagnosis not present

## 2018-04-28 DIAGNOSIS — H43813 Vitreous degeneration, bilateral: Secondary | ICD-10-CM | POA: Diagnosis not present

## 2018-04-28 DIAGNOSIS — H35033 Hypertensive retinopathy, bilateral: Secondary | ICD-10-CM | POA: Diagnosis not present

## 2018-05-10 DIAGNOSIS — B354 Tinea corporis: Secondary | ICD-10-CM | POA: Insufficient documentation

## 2018-05-30 ENCOUNTER — Encounter (INDEPENDENT_AMBULATORY_CARE_PROVIDER_SITE_OTHER): Payer: Medicare HMO | Admitting: Ophthalmology

## 2018-05-30 DIAGNOSIS — D3131 Benign neoplasm of right choroid: Secondary | ICD-10-CM

## 2018-05-30 DIAGNOSIS — H353112 Nonexudative age-related macular degeneration, right eye, intermediate dry stage: Secondary | ICD-10-CM

## 2018-05-30 DIAGNOSIS — H35033 Hypertensive retinopathy, bilateral: Secondary | ICD-10-CM

## 2018-05-30 DIAGNOSIS — I10 Essential (primary) hypertension: Secondary | ICD-10-CM

## 2018-05-30 DIAGNOSIS — H34812 Central retinal vein occlusion, left eye, with macular edema: Secondary | ICD-10-CM | POA: Diagnosis not present

## 2018-05-30 DIAGNOSIS — H43813 Vitreous degeneration, bilateral: Secondary | ICD-10-CM

## 2018-06-02 ENCOUNTER — Encounter (INDEPENDENT_AMBULATORY_CARE_PROVIDER_SITE_OTHER): Payer: Medicare HMO | Admitting: Ophthalmology

## 2018-06-03 ENCOUNTER — Other Ambulatory Visit: Payer: Self-pay | Admitting: Cardiology

## 2018-06-03 NOTE — Telephone Encounter (Signed)
Patient states that he needs refills sent to [facility] / tg

## 2018-06-03 NOTE — Telephone Encounter (Signed)
Pt notified he has refills on all meds, will call mail order

## 2018-07-04 ENCOUNTER — Encounter (INDEPENDENT_AMBULATORY_CARE_PROVIDER_SITE_OTHER): Payer: Medicare HMO | Admitting: Ophthalmology

## 2018-07-04 DIAGNOSIS — H43813 Vitreous degeneration, bilateral: Secondary | ICD-10-CM

## 2018-07-04 DIAGNOSIS — H34812 Central retinal vein occlusion, left eye, with macular edema: Secondary | ICD-10-CM

## 2018-07-04 DIAGNOSIS — H35033 Hypertensive retinopathy, bilateral: Secondary | ICD-10-CM | POA: Diagnosis not present

## 2018-07-04 DIAGNOSIS — D3131 Benign neoplasm of right choroid: Secondary | ICD-10-CM

## 2018-07-04 DIAGNOSIS — H353112 Nonexudative age-related macular degeneration, right eye, intermediate dry stage: Secondary | ICD-10-CM

## 2018-07-04 DIAGNOSIS — I10 Essential (primary) hypertension: Secondary | ICD-10-CM

## 2018-07-15 ENCOUNTER — Other Ambulatory Visit: Payer: Self-pay | Admitting: Cardiology

## 2018-08-09 ENCOUNTER — Encounter (INDEPENDENT_AMBULATORY_CARE_PROVIDER_SITE_OTHER): Payer: Medicare HMO | Admitting: Ophthalmology

## 2018-08-09 DIAGNOSIS — H35033 Hypertensive retinopathy, bilateral: Secondary | ICD-10-CM

## 2018-08-09 DIAGNOSIS — H34812 Central retinal vein occlusion, left eye, with macular edema: Secondary | ICD-10-CM | POA: Diagnosis not present

## 2018-08-09 DIAGNOSIS — I10 Essential (primary) hypertension: Secondary | ICD-10-CM | POA: Diagnosis not present

## 2018-08-09 DIAGNOSIS — D3131 Benign neoplasm of right choroid: Secondary | ICD-10-CM

## 2018-08-09 DIAGNOSIS — H353132 Nonexudative age-related macular degeneration, bilateral, intermediate dry stage: Secondary | ICD-10-CM | POA: Diagnosis not present

## 2018-08-09 DIAGNOSIS — H43813 Vitreous degeneration, bilateral: Secondary | ICD-10-CM

## 2018-09-05 ENCOUNTER — Encounter (INDEPENDENT_AMBULATORY_CARE_PROVIDER_SITE_OTHER): Payer: Medicare HMO | Admitting: Ophthalmology

## 2018-09-05 DIAGNOSIS — H34812 Central retinal vein occlusion, left eye, with macular edema: Secondary | ICD-10-CM | POA: Diagnosis not present

## 2018-09-05 DIAGNOSIS — H43813 Vitreous degeneration, bilateral: Secondary | ICD-10-CM

## 2018-09-05 DIAGNOSIS — I10 Essential (primary) hypertension: Secondary | ICD-10-CM

## 2018-09-05 DIAGNOSIS — H35033 Hypertensive retinopathy, bilateral: Secondary | ICD-10-CM

## 2018-09-05 DIAGNOSIS — H353112 Nonexudative age-related macular degeneration, right eye, intermediate dry stage: Secondary | ICD-10-CM | POA: Diagnosis not present

## 2018-09-05 DIAGNOSIS — D3131 Benign neoplasm of right choroid: Secondary | ICD-10-CM

## 2018-10-03 ENCOUNTER — Encounter (INDEPENDENT_AMBULATORY_CARE_PROVIDER_SITE_OTHER): Payer: Medicare HMO | Admitting: Ophthalmology

## 2018-10-03 DIAGNOSIS — D3131 Benign neoplasm of right choroid: Secondary | ICD-10-CM

## 2018-10-03 DIAGNOSIS — H43813 Vitreous degeneration, bilateral: Secondary | ICD-10-CM

## 2018-10-03 DIAGNOSIS — I10 Essential (primary) hypertension: Secondary | ICD-10-CM | POA: Diagnosis not present

## 2018-10-03 DIAGNOSIS — H34812 Central retinal vein occlusion, left eye, with macular edema: Secondary | ICD-10-CM

## 2018-10-03 DIAGNOSIS — H35033 Hypertensive retinopathy, bilateral: Secondary | ICD-10-CM

## 2018-10-03 DIAGNOSIS — H353132 Nonexudative age-related macular degeneration, bilateral, intermediate dry stage: Secondary | ICD-10-CM

## 2018-10-07 ENCOUNTER — Other Ambulatory Visit: Payer: Self-pay | Admitting: Cardiology

## 2018-10-07 NOTE — Progress Notes (Signed)
Cardiology Office Note  Date: 10/10/2018   ID: [name] [name], DOB October 18, 1936, MRN [id number]  PCP: Dione Housekeeper, MD  Primary Cardiologist: Rozann Lesches, MD   Chief Complaint  Patient presents with  . Coronary Artery Disease    History of Present Illness: [name] [name] is an 82 y.o. male last seen in May.  He is here today with his wife for a follow-up visit.  He turned 82 back in October.  Reports no overall major change in status.  Still limited with arthritic pain, intermittent dependent leg edema.  He does not report any angina symptoms in the last 3 months, needed a refill on nitroglycerin.  We have continued medical therapy following Myoview study earlier in April.  I reviewed his medications which are outlined below.  No changes made to current cardiac regimen.  He did have recent lab work with Dr. Edrick Oh, creatinine 1.5.  He continues to follow with nephrology.  Past Medical History:  Diagnosis Date  . Arthritis   . CHF (congestive heart failure) ([place])   . Chronic constipation   . Coronary atherosclerosis of native coronary artery    a. s/p CABG in 2002 b. low-risk NST in 2013  . Diverticulosis 2007   Colonoscopy  . Essential hypertension, benign   . Fatty liver disease, nonalcoholic   . GERD (gastroesophageal reflux disease)   . Headache(784.0)   . Hiatal hernia   . Ischemic cardiomyopathy    a. LVEF previously reduced to 35% with normalization since by review of prior notes  . Mixed hyperlipidemia   . Myocardial infarction ([place])   . Nephrolithiasis   . Renal insufficiency   . Schatzki's ring    Last EGD/ED Dr Paul Half    Past Surgical History:  Procedure Laterality Date  . CORONARY ARTERY BYPASS GRAFT  2002   LIMA to LAD, SVG to diagonal and ramus, SVG to PDA and PLA  . HEMORRHOID SURGERY    . KIDNEY STONE SURGERY     right  . KNEE ARTHROSCOPY     left  . NEPHRECTOMY     left  . TOTAL KNEE ARTHROPLASTY  08/01/2012   Procedure: TOTAL  KNEE ARTHROPLASTY;  Surgeon: Tobi Bastos, MD;  Location: [facility];  Service: Orthopedics;  Laterality: Left;    Current Outpatient Medications  Medication Sig Dispense Refill  . acetaminophen (TYLENOL) 500 MG tablet Take 1,000 mg by mouth every 6 (six) hours as needed for mild pain or moderate pain.    Marland Kitchen amLODipine (NORVASC) 10 MG tablet TAKE 1 TABLET AT BEDTIME 90 tablet 3  . atorvastatin (LIPITOR) 20 MG tablet TAKE 1 TABLET EVERY DAY 90 tablet 3  . brimonidine (ALPHAGAN) 0.2 % ophthalmic solution INSTILL 1 DROP INTO RIGHT EYE TWICE A DAY  3  . carvedilol (COREG) 12.5 MG tablet TAKE 1 TABLET TWICE DAILY WITH MEALS 180 tablet 0  . cholecalciferol (VITAMIN D) 1000 units tablet Take 1,000 Units by mouth daily.    . clopidogrel (PLAVIX) 75 MG tablet TAKE 1 TABLET EVERY DAY 90 tablet 3  . dorzolamide (TRUSOPT) 2 % ophthalmic solution Apply to eye.    . dorzolamide-timolol (COSOPT) 22.3-6.8 MG/ML ophthalmic solution Place 1 drop into both eyes 2 (two) times daily.     . finasteride (PROSCAR) 5 MG tablet Take 5 mg by mouth daily.    Marland Kitchen latanoprost (XALATAN) 0.005 % ophthalmic solution Place 1 drop into both eyes at bedtime.    Marland Kitchen lisinopril (PRINIVIL,ZESTRIL) 30 MG  tablet TAKE 1 TABLET EVERY DAY 90 tablet 3  . nitroGLYCERIN (NITROSTAT) 0.4 MG SL tablet Place 1 tablet (0.4 mg total) under the tongue every 5 (five) minutes as needed for chest pain. 25 tablet 3  . pantoprazole (PROTONIX) 40 MG tablet TAKE 1 TABLET EVERY DAY 90 tablet 3   No current facility-administered medications for this visit.    Allergies:  Amoxicillin   Social History: The patient  reports that he quit smoking about 57 years ago. His smoking use included cigarettes. He started smoking about 67 years ago. He has a 1.00 pack-year smoking history. His smokeless tobacco use includes chew. He reports that he does not drink alcohol or use drugs.   ROS:  Please see the history of present illness. Otherwise, complete review of  systems is positive for chronic arthritic pains and stiffness.  All other systems are reviewed and negative.   Physical Exam: VS:  BP (!) 116/58   Pulse 64   Ht 5\' 4"  (1.626 m)   Wt 191 lb (86.6 kg)   SpO2 98%   BMI 32.79 kg/m , BMI Body mass index is 32.79 kg/m.  Wt Readings from Last 3 Encounters:  10/10/18 191 lb (86.6 kg)  04/06/18 191 lb (86.6 kg)  01/26/18 193 lb (87.5 kg)    General: Elderly male, appears comfortable at rest. HEENT: Conjunctiva and lids normal, oropharynx clear. Neck: Supple, no elevated JVP or carotid bruits, no thyromegaly. Lungs: Decreased breath sounds, nonlabored breathing at rest. Cardiac: Regular rate and rhythm, no S3 or significant systolic murmur. Abdomen: Soft, nontender, bowel sounds present. Extremities: 1+ lower leg edema, distal pulses 2+. Skin: Warm and dry. Musculoskeletal: No kyphosis. Neuropsychiatric: Alert and oriented x3, affect grossly appropriate.  ECG: I personally reviewed the tracing from 01/26/2018 which showed sinus rhythm with left arm branch block.  Recent Labwork:  November 2019: BUN 13, creatinine 1.5, potassium 4.9  Other Studies Reviewed Today:  Lexiscan Myoview 02/21/2018:  Thinning with decreased tracer activity in the inferior and apical walls consistent with probable scar and soft tissue attenuation (diaphragm) No ischemia  This is an intermediate risk study.  Nuclear stress EF: 48%.  Assessment and Plan:  1.  Multivessel CAD status post CABG in 2002 with graft disease.  We continue with medical therapy, last ischemic testing was in April as outlined above.  He does not report any progressive angina symptoms.  Refill provided for nitroglycerin.  2.  Essential hypertension, blood pressure is well controlled today.  3.  Mixed hyperlipidemia, continues on Lipitor.  Current medicines were reviewed with the patient today.  Disposition: Follow-up in 6 months.  Signed, Satira Sark, MD,  [facility] 10/10/2018 2:13 PM    [facility] at [facility] [address], [place], [place] [address] Phone: [phone]; Fax: [phone]

## 2018-10-10 ENCOUNTER — Ambulatory Visit: Payer: Medicare HMO | Admitting: Cardiology

## 2018-10-10 ENCOUNTER — Encounter: Payer: Self-pay | Admitting: Cardiology

## 2018-10-10 VITALS — BP 116/58 | HR 64 | Ht 64.0 in | Wt 191.0 lb

## 2018-10-10 DIAGNOSIS — E782 Mixed hyperlipidemia: Secondary | ICD-10-CM | POA: Diagnosis not present

## 2018-10-10 DIAGNOSIS — I10 Essential (primary) hypertension: Secondary | ICD-10-CM | POA: Diagnosis not present

## 2018-10-10 DIAGNOSIS — I25119 Atherosclerotic heart disease of native coronary artery with unspecified angina pectoris: Secondary | ICD-10-CM

## 2018-10-10 MED ORDER — NITROGLYCERIN 0.4 MG SL SUBL: 0.4000 mg | SUBLINGUAL_TABLET | SUBLINGUAL | 3 refills | Status: DC | PRN

## 2018-10-10 NOTE — Patient Instructions (Signed)
Medication Instructions:  Your physician recommends that you continue on your current medications as directed. Please refer to the Current Medication list given to you today.  If you need a refill on your cardiac medications before your next appointment, please call your pharmacy.   Lab work: NONE If you have labs (blood work) drawn today and your tests are completely normal, you will receive your results only by: . MyChart Message (if you have MyChart) OR . A paper copy in the mail If you have any lab test that is abnormal or we need to change your treatment, we will call you to review the results.  Testing/Procedures: NONE  Follow-Up: At [facility], you and your health needs are our priority.  As part of our continuing mission to provide you with exceptional heart care, we have created designated Provider Care Teams.  These Care Teams include your primary Cardiologist (physician) and Advanced Practice Providers (APPs -  Physician Assistants and Nurse Practitioners) who all work together to provide you with the care you need, when you need it. You will need a follow up appointment in 6 months.  Please call our office 2 months in advance to schedule this appointment.  You may see Samuel McDowell, MD or one of the following Advanced Practice Providers on your designated Care Team:   Brittany Strader, PA-C ([place] Office) . Michele Lenze, PA-C ([place] Office)  Any Other Special Instructions Will Be Listed Below (If Applicable). NONE   

## 2018-11-15 ENCOUNTER — Encounter (INDEPENDENT_AMBULATORY_CARE_PROVIDER_SITE_OTHER): Payer: Medicare HMO | Admitting: Ophthalmology

## 2018-11-15 DIAGNOSIS — I10 Essential (primary) hypertension: Secondary | ICD-10-CM

## 2018-11-15 DIAGNOSIS — H34812 Central retinal vein occlusion, left eye, with macular edema: Secondary | ICD-10-CM

## 2018-11-15 DIAGNOSIS — D3131 Benign neoplasm of right choroid: Secondary | ICD-10-CM

## 2018-11-15 DIAGNOSIS — H35033 Hypertensive retinopathy, bilateral: Secondary | ICD-10-CM

## 2018-11-15 DIAGNOSIS — H43813 Vitreous degeneration, bilateral: Secondary | ICD-10-CM

## 2018-11-15 DIAGNOSIS — H353132 Nonexudative age-related macular degeneration, bilateral, intermediate dry stage: Secondary | ICD-10-CM

## 2018-12-20 ENCOUNTER — Encounter (INDEPENDENT_AMBULATORY_CARE_PROVIDER_SITE_OTHER): Payer: Medicare HMO | Admitting: Ophthalmology

## 2018-12-20 DIAGNOSIS — H353132 Nonexudative age-related macular degeneration, bilateral, intermediate dry stage: Secondary | ICD-10-CM

## 2018-12-20 DIAGNOSIS — H35033 Hypertensive retinopathy, bilateral: Secondary | ICD-10-CM | POA: Diagnosis not present

## 2018-12-20 DIAGNOSIS — I10 Essential (primary) hypertension: Secondary | ICD-10-CM | POA: Diagnosis not present

## 2018-12-20 DIAGNOSIS — D3131 Benign neoplasm of right choroid: Secondary | ICD-10-CM

## 2018-12-20 DIAGNOSIS — H34812 Central retinal vein occlusion, left eye, with macular edema: Secondary | ICD-10-CM

## 2018-12-20 DIAGNOSIS — H43813 Vitreous degeneration, bilateral: Secondary | ICD-10-CM

## 2019-01-06 ENCOUNTER — Other Ambulatory Visit: Payer: Self-pay | Admitting: Cardiology

## 2019-01-23 ENCOUNTER — Encounter (INDEPENDENT_AMBULATORY_CARE_PROVIDER_SITE_OTHER): Payer: Medicare HMO | Admitting: Ophthalmology

## 2019-01-23 ENCOUNTER — Other Ambulatory Visit: Payer: Self-pay

## 2019-01-23 DIAGNOSIS — H35033 Hypertensive retinopathy, bilateral: Secondary | ICD-10-CM

## 2019-01-23 DIAGNOSIS — I10 Essential (primary) hypertension: Secondary | ICD-10-CM

## 2019-01-23 DIAGNOSIS — H353132 Nonexudative age-related macular degeneration, bilateral, intermediate dry stage: Secondary | ICD-10-CM | POA: Diagnosis not present

## 2019-01-23 DIAGNOSIS — H43813 Vitreous degeneration, bilateral: Secondary | ICD-10-CM

## 2019-01-23 DIAGNOSIS — D3131 Benign neoplasm of right choroid: Secondary | ICD-10-CM

## 2019-01-23 DIAGNOSIS — H34812 Central retinal vein occlusion, left eye, with macular edema: Secondary | ICD-10-CM | POA: Diagnosis not present

## 2019-01-25 ENCOUNTER — Encounter (INDEPENDENT_AMBULATORY_CARE_PROVIDER_SITE_OTHER): Payer: Medicare HMO | Admitting: Ophthalmology

## 2019-02-07 ENCOUNTER — Other Ambulatory Visit: Payer: Self-pay | Admitting: Cardiology

## 2019-02-28 ENCOUNTER — Encounter (INDEPENDENT_AMBULATORY_CARE_PROVIDER_SITE_OTHER): Payer: Medicare HMO | Admitting: Ophthalmology

## 2019-02-28 ENCOUNTER — Other Ambulatory Visit: Payer: Self-pay

## 2019-02-28 DIAGNOSIS — D3131 Benign neoplasm of right choroid: Secondary | ICD-10-CM

## 2019-02-28 DIAGNOSIS — I10 Essential (primary) hypertension: Secondary | ICD-10-CM | POA: Diagnosis not present

## 2019-02-28 DIAGNOSIS — H35033 Hypertensive retinopathy, bilateral: Secondary | ICD-10-CM | POA: Diagnosis not present

## 2019-02-28 DIAGNOSIS — H353132 Nonexudative age-related macular degeneration, bilateral, intermediate dry stage: Secondary | ICD-10-CM | POA: Diagnosis not present

## 2019-02-28 DIAGNOSIS — H34812 Central retinal vein occlusion, left eye, with macular edema: Secondary | ICD-10-CM

## 2019-02-28 DIAGNOSIS — H43813 Vitreous degeneration, bilateral: Secondary | ICD-10-CM

## 2019-04-04 ENCOUNTER — Encounter (INDEPENDENT_AMBULATORY_CARE_PROVIDER_SITE_OTHER): Payer: Medicare HMO | Admitting: Ophthalmology

## 2019-04-04 ENCOUNTER — Other Ambulatory Visit: Payer: Self-pay

## 2019-04-04 DIAGNOSIS — H34812 Central retinal vein occlusion, left eye, with macular edema: Secondary | ICD-10-CM | POA: Diagnosis not present

## 2019-04-04 DIAGNOSIS — H43813 Vitreous degeneration, bilateral: Secondary | ICD-10-CM

## 2019-04-04 DIAGNOSIS — I10 Essential (primary) hypertension: Secondary | ICD-10-CM | POA: Diagnosis not present

## 2019-04-04 DIAGNOSIS — H353112 Nonexudative age-related macular degeneration, right eye, intermediate dry stage: Secondary | ICD-10-CM

## 2019-04-04 DIAGNOSIS — H35033 Hypertensive retinopathy, bilateral: Secondary | ICD-10-CM | POA: Diagnosis not present

## 2019-04-04 DIAGNOSIS — D3131 Benign neoplasm of right choroid: Secondary | ICD-10-CM

## 2019-04-05 ENCOUNTER — Other Ambulatory Visit: Payer: Self-pay | Admitting: Cardiology

## 2019-04-19 ENCOUNTER — Other Ambulatory Visit: Payer: Self-pay | Admitting: Cardiology

## 2019-04-20 DIAGNOSIS — I10 Essential (primary) hypertension: Secondary | ICD-10-CM | POA: Diagnosis not present

## 2019-04-20 DIAGNOSIS — E559 Vitamin D deficiency, unspecified: Secondary | ICD-10-CM | POA: Diagnosis not present

## 2019-04-20 DIAGNOSIS — E785 Hyperlipidemia, unspecified: Secondary | ICD-10-CM | POA: Diagnosis not present

## 2019-04-24 DIAGNOSIS — I10 Essential (primary) hypertension: Secondary | ICD-10-CM | POA: Diagnosis not present

## 2019-04-24 DIAGNOSIS — I2583 Coronary atherosclerosis due to lipid rich plaque: Secondary | ICD-10-CM | POA: Diagnosis not present

## 2019-04-24 DIAGNOSIS — N182 Chronic kidney disease, stage 2 (mild): Secondary | ICD-10-CM | POA: Diagnosis not present

## 2019-04-24 DIAGNOSIS — E559 Vitamin D deficiency, unspecified: Secondary | ICD-10-CM | POA: Diagnosis not present

## 2019-04-24 DIAGNOSIS — K219 Gastro-esophageal reflux disease without esophagitis: Secondary | ICD-10-CM | POA: Diagnosis not present

## 2019-04-24 DIAGNOSIS — E785 Hyperlipidemia, unspecified: Secondary | ICD-10-CM | POA: Diagnosis not present

## 2019-04-24 DIAGNOSIS — I2510 Atherosclerotic heart disease of native coronary artery without angina pectoris: Secondary | ICD-10-CM | POA: Diagnosis not present

## 2019-04-24 DIAGNOSIS — N400 Enlarged prostate without lower urinary tract symptoms: Secondary | ICD-10-CM | POA: Diagnosis not present

## 2019-05-02 ENCOUNTER — Encounter (INDEPENDENT_AMBULATORY_CARE_PROVIDER_SITE_OTHER): Payer: Medicare HMO | Admitting: Ophthalmology

## 2019-05-02 ENCOUNTER — Other Ambulatory Visit: Payer: Self-pay

## 2019-05-02 DIAGNOSIS — H353132 Nonexudative age-related macular degeneration, bilateral, intermediate dry stage: Secondary | ICD-10-CM | POA: Diagnosis not present

## 2019-05-02 DIAGNOSIS — H34812 Central retinal vein occlusion, left eye, with macular edema: Secondary | ICD-10-CM | POA: Diagnosis not present

## 2019-05-02 DIAGNOSIS — H35033 Hypertensive retinopathy, bilateral: Secondary | ICD-10-CM | POA: Diagnosis not present

## 2019-05-02 DIAGNOSIS — D3131 Benign neoplasm of right choroid: Secondary | ICD-10-CM

## 2019-05-02 DIAGNOSIS — H43813 Vitreous degeneration, bilateral: Secondary | ICD-10-CM | POA: Diagnosis not present

## 2019-05-02 DIAGNOSIS — I10 Essential (primary) hypertension: Secondary | ICD-10-CM

## 2019-05-17 ENCOUNTER — Ambulatory Visit: Payer: Medicare HMO | Admitting: Cardiology

## 2019-05-17 ENCOUNTER — Other Ambulatory Visit: Payer: Self-pay

## 2019-05-17 ENCOUNTER — Encounter: Payer: Self-pay | Admitting: Cardiology

## 2019-05-17 VITALS — BP 123/63 | HR 57 | Temp 97.6°F | Ht 66.0 in | Wt 187.0 lb

## 2019-05-17 DIAGNOSIS — I10 Essential (primary) hypertension: Secondary | ICD-10-CM

## 2019-05-17 DIAGNOSIS — I25119 Atherosclerotic heart disease of native coronary artery with unspecified angina pectoris: Secondary | ICD-10-CM | POA: Diagnosis not present

## 2019-05-17 DIAGNOSIS — I110 Hypertensive heart disease with heart failure: Secondary | ICD-10-CM | POA: Diagnosis not present

## 2019-05-17 DIAGNOSIS — I509 Heart failure, unspecified: Secondary | ICD-10-CM | POA: Diagnosis not present

## 2019-05-17 DIAGNOSIS — I2510 Atherosclerotic heart disease of native coronary artery without angina pectoris: Secondary | ICD-10-CM | POA: Diagnosis not present

## 2019-05-17 DIAGNOSIS — E782 Mixed hyperlipidemia: Secondary | ICD-10-CM | POA: Diagnosis not present

## 2019-05-17 DIAGNOSIS — R972 Elevated prostate specific antigen [PSA]: Secondary | ICD-10-CM | POA: Diagnosis not present

## 2019-05-17 NOTE — Progress Notes (Signed)
Cardiology Office Note  Date: 05/17/2019   ID: [name] [name], DOB 10-12-1936, MRN [id number]  PCP:  [name] Fraise, MD  Cardiologist:  [name] Lesches, MD Electrophysiologist:  None   Chief Complaint  Patient presents with  . Coronary Artery Disease    History of Present Illness: [name] [name] is an 83 y.o. male last seen in December 2019.  He presents for a routine visit.  Since last encounter he does not report any progressive angina symptoms, only rare nitroglycerin use.  He and his wife have otherwise been doing okay by his report.  He is now following with [facility] for primary care.  I reviewed his recent lab work as outlined below.  We went over his medications which are listed below and stable from a cardiac perspective.  I personally reviewed his ECG today which shows sinus bradycardia with left bundle branch block.  Last ischemic testing was in 2019, we continue medical therapy.  Past Medical History:  Diagnosis Date  . Arthritis   . CHF (congestive heart failure) ([place])   . Chronic constipation   . Coronary atherosclerosis of native coronary artery    a. s/p CABG in 2002 b. low-risk NST in 2013  . Diverticulosis 2007   Colonoscopy  . Essential hypertension, benign   . Fatty liver disease, nonalcoholic   . GERD (gastroesophageal reflux disease)   . Headache(784.0)   . Hiatal hernia   . Ischemic cardiomyopathy    a. LVEF previously reduced to 35% with normalization since by review of prior notes  . Mixed hyperlipidemia   . Myocardial infarction ([place])   . Nephrolithiasis   . Renal insufficiency   . Schatzki's ring    Last EGD/ED Dr [name] [name]    Past Surgical History:  Procedure Laterality Date  . CORONARY ARTERY BYPASS GRAFT  2002   LIMA to LAD, SVG to diagonal and ramus, SVG to PDA and PLA  . HEMORRHOID SURGERY    . KIDNEY STONE SURGERY     right  . KNEE ARTHROSCOPY     left  . NEPHRECTOMY     left  . TOTAL KNEE ARTHROPLASTY  08/01/2012   Procedure: TOTAL KNEE ARTHROPLASTY;  Surgeon: [name] Bastos, MD;  Location: [facility];  Service: Orthopedics;  Laterality: Left;    Current Outpatient Medications  Medication Sig Dispense Refill  . acetaminophen (TYLENOL) 500 MG tablet Take 1,000 mg by mouth every 6 (six) hours as needed for mild pain or moderate pain.    Marland Kitchen amLODipine (NORVASC) 10 MG tablet TAKE 1 TABLET AT BEDTIME 90 tablet 3  . atorvastatin (LIPITOR) 20 MG tablet TAKE 1 TABLET EVERY DAY 90 tablet 3  . brimonidine (ALPHAGAN) 0.2 % ophthalmic solution INSTILL 1 DROP INTO RIGHT EYE TWICE A DAY  3  . carvedilol (COREG) 12.5 MG tablet TAKE 1 TABLET TWICE DAILY WITH MEALS 180 tablet 0  . cholecalciferol (VITAMIN D) 1000 units tablet Take 1,000 Units by mouth daily.    . clopidogrel (PLAVIX) 75 MG tablet TAKE 1 TABLET EVERY DAY 90 tablet 3  . dorzolamide (TRUSOPT) 2 % ophthalmic solution Apply to eye.    . dorzolamide-timolol (COSOPT) 22.3-6.8 MG/ML ophthalmic solution Place 1 drop into both eyes 2 (two) times daily.     . finasteride (PROSCAR) 5 MG tablet Take 5 mg by mouth daily.    Marland Kitchen latanoprost (XALATAN) 0.005 % ophthalmic solution Place 1 drop into both eyes at bedtime.    Marland Kitchen lisinopril (ZESTRIL) 30 MG  tablet TAKE 1 TABLET EVERY DAY 90 tablet 3  . nitroGLYCERIN (NITROSTAT) 0.4 MG SL tablet Place 1 tablet (0.4 mg total) under the tongue every 5 (five) minutes as needed for chest pain. 25 tablet 3  . pantoprazole (PROTONIX) 40 MG tablet TAKE 1 TABLET EVERY DAY 90 tablet 3   No current facility-administered medications for this visit.    Allergies:  Amoxicillin   Social History: The patient  reports that he quit smoking about 58 years ago. His smoking use included cigarettes. He started smoking about 68 years ago. He has a 1.00 pack-year smoking history. His smokeless tobacco use includes chew. He reports that he does not drink alcohol or use drugs.   ROS:  Please see the history of present illness. Otherwise, complete review  of systems is positive for none.  All other systems are reviewed and negative.   Physical Exam: VS:  BP 123/63 (BP Location: Left Arm)   Pulse (!) 57   Temp 97.6 F (36.4 C)   Ht 5\' 6"  (1.676 m)   Wt 187 lb (84.8 kg)   SpO2 97%   BMI 30.18 kg/m , BMI Body mass index is 30.18 kg/m.  Wt Readings from Last 3 Encounters:  05/17/19 187 lb (84.8 kg)  10/10/18 191 lb (86.6 kg)  04/06/18 191 lb (86.6 kg)    General: Elderly male, appears comfortable at rest. HEENT: Conjunctiva and lids normal, oropharynx clear. Neck: Supple, no elevated JVP or carotid bruits, no thyromegaly. Lungs: Breath sounds, nonlabored breathing at rest. Cardiac: Regular rate and rhythm, no S3 or significant systolic murmur, no pericardial rub. Abdomen: Soft, nontender, bowel sounds present. Extremities: Mild ankle edema , distal pulses 2+. Skin: Warm and dry. Musculoskeletal: No kyphosis. Neuropsychiatric: Alert and oriented x3, affect grossly appropriate.  ECG:  An ECG dated 01/26/2018 was personally reviewed today and demonstrated:  Sinus rhythm with left bundle branch block.  Recent Labwork:  June 2020: Cholesterol 130, triglycerides 121, HDL 42, LDL 64, BUN 15, creatinine 1.31, potassium 4.6 AST 16, ALT 9, hemoglobin 14.9, platelets 206  Other Studies Reviewed Today:  Lexiscan Myoview 02/21/2018:  Thinning with decreased tracer activity in the inferior and apical walls consistent with probable scar and soft tissue attenuation (diaphragm) No ischemia  This is an intermediate risk study.  Nuclear stress EF: 48%.  Assessment and Plan:  1.  Multivessel CAD status post CABG with documented graft disease being managed medically.  He does not report any progressive angina symptoms on current medical regimen and we will continue with observation.  ECG reviewed and stable.  2.  Mixed hyperlipidemia, continues on Lipitor.  Recent follow-up LDL 64.  3.  Essential hypertension, blood pressure is well  controlled today.  No changes made to current regimen.  Medication Adjustments/Labs and Tests Ordered: Current medicines are reviewed at length with the patient today.  Concerns regarding medicines are outlined above.   Tests Ordered: Orders Placed This Encounter  Procedures  . EKG 12-Lead    Medication Changes: No orders of the defined types were placed in this encounter.   Disposition:  Follow up 6 months in the [place] office.  Signed, Satira Sark, MD, [facility] 05/17/2019 11:37 AM    [place] [facility] [place]. [address] [place], [place] [address] Phone: [phone]; Fax: [phone]

## 2019-05-17 NOTE — Patient Instructions (Signed)
Medication Instructions: Your physician recommends that you continue on your current medications as directed. Please refer to the Current Medication list given to you today.   Labwork: none  Procedures/Testing: none  Follow-Up: 6 months with Dr.McDowell  Any Additional Special Instructions Will Be Listed Below (If Applicable).     If you need a refill on your cardiac medications before your next appointment, please call your pharmacy.      Thank you for choosing [facility] !        

## 2019-05-29 ENCOUNTER — Other Ambulatory Visit: Payer: Self-pay | Admitting: Cardiology

## 2019-05-30 ENCOUNTER — Other Ambulatory Visit: Payer: Self-pay

## 2019-05-30 ENCOUNTER — Encounter (INDEPENDENT_AMBULATORY_CARE_PROVIDER_SITE_OTHER): Payer: Medicare HMO | Admitting: Ophthalmology

## 2019-05-30 DIAGNOSIS — H353132 Nonexudative age-related macular degeneration, bilateral, intermediate dry stage: Secondary | ICD-10-CM

## 2019-05-30 DIAGNOSIS — H43813 Vitreous degeneration, bilateral: Secondary | ICD-10-CM

## 2019-05-30 DIAGNOSIS — H35033 Hypertensive retinopathy, bilateral: Secondary | ICD-10-CM

## 2019-05-30 DIAGNOSIS — H34812 Central retinal vein occlusion, left eye, with macular edema: Secondary | ICD-10-CM | POA: Diagnosis not present

## 2019-05-30 DIAGNOSIS — I10 Essential (primary) hypertension: Secondary | ICD-10-CM

## 2019-05-30 DIAGNOSIS — D3131 Benign neoplasm of right choroid: Secondary | ICD-10-CM

## 2019-06-05 ENCOUNTER — Ambulatory Visit (INDEPENDENT_AMBULATORY_CARE_PROVIDER_SITE_OTHER): Payer: Medicare HMO | Admitting: Family

## 2019-06-05 ENCOUNTER — Encounter: Payer: Self-pay | Admitting: Family

## 2019-06-05 ENCOUNTER — Telehealth: Payer: Self-pay | Admitting: Family Medicine

## 2019-06-05 DIAGNOSIS — H01001 Unspecified blepharitis right upper eyelid: Secondary | ICD-10-CM

## 2019-06-05 MED ORDER — BACITRACIN 500 UNIT/GM OP OINT: 1.0000 "application " | TOPICAL_OINTMENT | Freq: Four times a day (QID) | OPHTHALMIC | 0 refills | Status: DC

## 2019-06-05 NOTE — Progress Notes (Signed)
   Virtual Visit via telephone Note  I connected with [name] [name] on 06/05/19 at 12:06 pm by telephone and verified that I am speaking with the correct person using two identifiers. [name] [name] is currently located at home and son is currently with her during visit. The provider, Evelina Dun, FNP is located in their office at time of visit.  I discussed the limitations, risks, security and privacy concerns of performing an evaluation and management service by telephone and the availability of in person appointments. I also discussed with the patient that there may be a patient responsible charge related to this service. The patient expressed understanding and agreed to proceed.   History and Present Illness:  HPI Pt calls the office today with complaints of right eye lid irration. He is scheduled a visit with Dr. [name] on 07/25/19 to  establish care.  He the eye is erythemas, mild swollen, and tender. That he noticed this about a week ago. He states this is unchanged. States he is having intermittent discharge from the eye, but unsure the color. Denies any fever.   He does not wear contacts, but does wear glasses some times.  Review of Systems  All other systems reviewed and are negative.    Observations/Objective: No SOB or distress noted   Assessment and Plan: 1. Blepharitis of right upper eyelid, unspecified type Do not rub or scratch Continue compresses  Keep clean and dry Good hand hygiene  RTO if eye worsens or does not improve in next 2-3 days - bacitracin ophthalmic ointment; Place 1 application into the right eye 4 (four) times daily. apply to eye  Dispense: 3.5 g; Refill: 0     I discussed the assessment and treatment plan with the patient. The patient was provided an opportunity to ask questions and all were answered. The patient agreed with the plan and demonstrated an understanding of the instructions.   The patient was advised to call back or seek an  in-person evaluation if the symptoms worsen or if the condition fails to improve as anticipated.  The above assessment and management plan was discussed with the patient. The patient verbalized understanding of and has agreed to the management plan. Patient is aware to call the clinic if symptoms persist or worsen. Patient is aware when to return to the clinic for a follow-up visit. Patient educated on when it is appropriate to go to the emergency department.   Time call ended:  12:20 pm  I provided 14 minutes of non-face-to-face time during this encounter.    Evelina Dun, FNP

## 2019-06-05 NOTE — Telephone Encounter (Signed)
Patient has a follow up appointment scheduled. 

## 2019-06-07 DIAGNOSIS — H01132 Eczematous dermatitis of right lower eyelid: Secondary | ICD-10-CM | POA: Diagnosis not present

## 2019-06-07 DIAGNOSIS — H01134 Eczematous dermatitis of left upper eyelid: Secondary | ICD-10-CM | POA: Diagnosis not present

## 2019-06-07 DIAGNOSIS — H1045 Other chronic allergic conjunctivitis: Secondary | ICD-10-CM | POA: Diagnosis not present

## 2019-06-07 DIAGNOSIS — H01131 Eczematous dermatitis of right upper eyelid: Secondary | ICD-10-CM | POA: Diagnosis not present

## 2019-06-07 DIAGNOSIS — H01135 Eczematous dermatitis of left lower eyelid: Secondary | ICD-10-CM | POA: Diagnosis not present

## 2019-06-19 ENCOUNTER — Ambulatory Visit: Payer: Medicare HMO | Admitting: Family

## 2019-06-20 ENCOUNTER — Other Ambulatory Visit: Payer: Self-pay

## 2019-06-20 ENCOUNTER — Ambulatory Visit (INDEPENDENT_AMBULATORY_CARE_PROVIDER_SITE_OTHER): Payer: Medicare HMO | Admitting: Urology

## 2019-06-20 DIAGNOSIS — R972 Elevated prostate specific antigen [PSA]: Secondary | ICD-10-CM

## 2019-06-20 DIAGNOSIS — N400 Enlarged prostate without lower urinary tract symptoms: Secondary | ICD-10-CM

## 2019-06-27 ENCOUNTER — Encounter (INDEPENDENT_AMBULATORY_CARE_PROVIDER_SITE_OTHER): Payer: Medicare HMO | Admitting: Ophthalmology

## 2019-06-27 ENCOUNTER — Other Ambulatory Visit: Payer: Self-pay

## 2019-06-27 DIAGNOSIS — H353132 Nonexudative age-related macular degeneration, bilateral, intermediate dry stage: Secondary | ICD-10-CM

## 2019-06-27 DIAGNOSIS — H35033 Hypertensive retinopathy, bilateral: Secondary | ICD-10-CM

## 2019-06-27 DIAGNOSIS — H34812 Central retinal vein occlusion, left eye, with macular edema: Secondary | ICD-10-CM

## 2019-06-27 DIAGNOSIS — I10 Essential (primary) hypertension: Secondary | ICD-10-CM | POA: Diagnosis not present

## 2019-06-27 DIAGNOSIS — H43813 Vitreous degeneration, bilateral: Secondary | ICD-10-CM

## 2019-06-27 DIAGNOSIS — D3131 Benign neoplasm of right choroid: Secondary | ICD-10-CM | POA: Diagnosis not present

## 2019-07-08 ENCOUNTER — Other Ambulatory Visit: Payer: Self-pay | Admitting: Cardiology

## 2019-07-18 DIAGNOSIS — H01131 Eczematous dermatitis of right upper eyelid: Secondary | ICD-10-CM | POA: Diagnosis not present

## 2019-07-18 DIAGNOSIS — H01135 Eczematous dermatitis of left lower eyelid: Secondary | ICD-10-CM | POA: Diagnosis not present

## 2019-07-18 DIAGNOSIS — H01132 Eczematous dermatitis of right lower eyelid: Secondary | ICD-10-CM | POA: Diagnosis not present

## 2019-07-18 DIAGNOSIS — H401133 Primary open-angle glaucoma, bilateral, severe stage: Secondary | ICD-10-CM | POA: Diagnosis not present

## 2019-07-18 DIAGNOSIS — H01134 Eczematous dermatitis of left upper eyelid: Secondary | ICD-10-CM | POA: Diagnosis not present

## 2019-07-24 ENCOUNTER — Other Ambulatory Visit: Payer: Self-pay | Admitting: Cardiology

## 2019-07-25 ENCOUNTER — Ambulatory Visit: Payer: Medicare HMO | Admitting: Family Medicine

## 2019-07-25 ENCOUNTER — Other Ambulatory Visit: Payer: Self-pay

## 2019-07-25 ENCOUNTER — Encounter (INDEPENDENT_AMBULATORY_CARE_PROVIDER_SITE_OTHER): Payer: Medicare HMO | Admitting: Ophthalmology

## 2019-07-25 DIAGNOSIS — H43813 Vitreous degeneration, bilateral: Secondary | ICD-10-CM

## 2019-07-25 DIAGNOSIS — H34812 Central retinal vein occlusion, left eye, with macular edema: Secondary | ICD-10-CM | POA: Diagnosis not present

## 2019-07-25 DIAGNOSIS — H353132 Nonexudative age-related macular degeneration, bilateral, intermediate dry stage: Secondary | ICD-10-CM

## 2019-07-25 DIAGNOSIS — I10 Essential (primary) hypertension: Secondary | ICD-10-CM | POA: Diagnosis not present

## 2019-07-25 DIAGNOSIS — D3131 Benign neoplasm of right choroid: Secondary | ICD-10-CM | POA: Diagnosis not present

## 2019-07-25 DIAGNOSIS — H35033 Hypertensive retinopathy, bilateral: Secondary | ICD-10-CM

## 2019-08-21 ENCOUNTER — Encounter (INDEPENDENT_AMBULATORY_CARE_PROVIDER_SITE_OTHER): Payer: Medicare HMO | Admitting: Ophthalmology

## 2019-08-21 ENCOUNTER — Other Ambulatory Visit: Payer: Self-pay

## 2019-08-21 DIAGNOSIS — H0102A Squamous blepharitis right eye, upper and lower eyelids: Secondary | ICD-10-CM | POA: Diagnosis not present

## 2019-08-21 DIAGNOSIS — H01131 Eczematous dermatitis of right upper eyelid: Secondary | ICD-10-CM | POA: Diagnosis not present

## 2019-08-21 DIAGNOSIS — H0102B Squamous blepharitis left eye, upper and lower eyelids: Secondary | ICD-10-CM | POA: Diagnosis not present

## 2019-08-21 DIAGNOSIS — H01132 Eczematous dermatitis of right lower eyelid: Secondary | ICD-10-CM | POA: Diagnosis not present

## 2019-08-21 DIAGNOSIS — H01135 Eczematous dermatitis of left lower eyelid: Secondary | ICD-10-CM | POA: Diagnosis not present

## 2019-08-21 DIAGNOSIS — H01134 Eczematous dermatitis of left upper eyelid: Secondary | ICD-10-CM | POA: Diagnosis not present

## 2019-08-21 DIAGNOSIS — H401133 Primary open-angle glaucoma, bilateral, severe stage: Secondary | ICD-10-CM | POA: Diagnosis not present

## 2019-09-04 ENCOUNTER — Other Ambulatory Visit: Payer: Self-pay

## 2019-09-04 ENCOUNTER — Encounter (INDEPENDENT_AMBULATORY_CARE_PROVIDER_SITE_OTHER): Payer: Medicare HMO | Admitting: Ophthalmology

## 2019-09-04 DIAGNOSIS — I10 Essential (primary) hypertension: Secondary | ICD-10-CM | POA: Diagnosis not present

## 2019-09-04 DIAGNOSIS — H353122 Nonexudative age-related macular degeneration, left eye, intermediate dry stage: Secondary | ICD-10-CM

## 2019-09-04 DIAGNOSIS — H353111 Nonexudative age-related macular degeneration, right eye, early dry stage: Secondary | ICD-10-CM

## 2019-09-04 DIAGNOSIS — H43813 Vitreous degeneration, bilateral: Secondary | ICD-10-CM | POA: Diagnosis not present

## 2019-09-04 DIAGNOSIS — D3131 Benign neoplasm of right choroid: Secondary | ICD-10-CM

## 2019-09-04 DIAGNOSIS — H35033 Hypertensive retinopathy, bilateral: Secondary | ICD-10-CM

## 2019-09-04 DIAGNOSIS — H34812 Central retinal vein occlusion, left eye, with macular edema: Secondary | ICD-10-CM | POA: Diagnosis not present

## 2019-09-05 ENCOUNTER — Other Ambulatory Visit: Payer: Self-pay

## 2019-09-06 ENCOUNTER — Other Ambulatory Visit: Payer: Self-pay

## 2019-09-06 ENCOUNTER — Encounter: Payer: Self-pay | Admitting: Family Medicine

## 2019-09-06 ENCOUNTER — Ambulatory Visit (INDEPENDENT_AMBULATORY_CARE_PROVIDER_SITE_OTHER): Payer: Medicare HMO | Admitting: Family Medicine

## 2019-09-06 VITALS — BP 129/68 | HR 53 | Temp 97.5°F | Resp 20 | Ht 66.0 in | Wt 187.0 lb

## 2019-09-06 DIAGNOSIS — I10 Essential (primary) hypertension: Secondary | ICD-10-CM

## 2019-09-06 DIAGNOSIS — K2100 Gastro-esophageal reflux disease with esophagitis, without bleeding: Secondary | ICD-10-CM | POA: Diagnosis not present

## 2019-09-06 DIAGNOSIS — I2510 Atherosclerotic heart disease of native coronary artery without angina pectoris: Secondary | ICD-10-CM | POA: Diagnosis not present

## 2019-09-06 DIAGNOSIS — Z23 Encounter for immunization: Secondary | ICD-10-CM

## 2019-09-06 DIAGNOSIS — E559 Vitamin D deficiency, unspecified: Secondary | ICD-10-CM

## 2019-09-06 DIAGNOSIS — E7800 Pure hypercholesterolemia, unspecified: Secondary | ICD-10-CM

## 2019-09-06 DIAGNOSIS — Z125 Encounter for screening for malignant neoplasm of prostate: Secondary | ICD-10-CM | POA: Diagnosis not present

## 2019-09-06 MED ORDER — MOMETASONE FUROATE 0.1 % EX CREA: 1.0000 "application " | TOPICAL_CREAM | Freq: Every day | CUTANEOUS | 1 refills | Status: DC

## 2019-09-06 MED ORDER — MOMETASONE FUROATE 0.1 % EX CREA: 1.0000 "application " | TOPICAL_CREAM | Freq: Every day | CUTANEOUS | 1 refills | Status: AC

## 2019-09-06 NOTE — Progress Notes (Addendum)
Subjective:  Patient ID: [name] [name], male    DOB: 1936-10-10  Age: 83 y.o. MRN: [id number]  CC: Establish Care   HPI [name] [name] presents for new patient evaluation. He has a history of left nephrectomy and high blood pressure. Patient has no history of headache chest pain or shortness of breath or recent cough. Patient also denies symptoms of TIA such as focal numbness or weakness. Patient denies side effects from medication. States taking it regularly.He has had stent placed for ASCVD. Currently asymptomatic.    in for follow-up of elevated cholesterol. Doing well without complaints on current medication. Denies side effects of statin including myalgia and arthralgia and nausea. Currently no chest pain, shortness of breath or other cardiovascular related symptoms noted.   Patient in for follow-up of GERD. Currently asymptomatic taking  PPI daily. There is no chest pain or heartburn. No hematemesis and no melena. No dysphagia or choking. Onset is remote. Progression is stable. Complicating factors, none.   No flowsheet data found.  History [name] [name] has a past medical history of Arthritis, CHF (congestive heart failure) ([place]), Chronic constipation, Coronary atherosclerosis of native coronary artery, Diverticulosis (2007), Essential hypertension, benign, Fatty liver disease, nonalcoholic, GERD (gastroesophageal reflux disease), Headache(784.0), Hiatal hernia, Ischemic cardiomyopathy, Mixed hyperlipidemia, Myocardial infarction ([place]), Nephrolithiasis, Renal insufficiency, and Schatzki's ring.   He has a past surgical history that includes Coronary artery bypass graft (2002); Nephrectomy; Kidney stone surgery; Knee arthroscopy; Hemorrhoid surgery; and Total knee arthroplasty (08/01/2012).   His family history includes Coronary artery disease in his father.He reports that he quit smoking about 58 years ago. His smoking use included cigarettes. He started smoking about 68 years ago. He  has a 1.00 pack-year smoking history. His smokeless tobacco use includes chew. He reports that he does not drink alcohol or use drugs.    ROS Review of Systems  Constitutional: Negative.   HENT: Negative.   Eyes: Negative for visual disturbance.  Respiratory: Negative for cough and shortness of breath.   Cardiovascular: Negative for chest pain and leg swelling.  Gastrointestinal: Negative for abdominal pain, diarrhea, nausea and vomiting.  Genitourinary: Negative for difficulty urinating.  Musculoskeletal: Negative for arthralgias and myalgias.  Skin: Negative for rash.  Neurological: Negative for headaches.  Psychiatric/Behavioral: Negative for sleep disturbance.    Objective:  BP 129/68   Pulse (!) 53   Temp (!) 97.5 F (36.4 C) (Temporal)   Resp 20   Ht _0  (1.676 m)   Wt 187 lb (84.8 kg)   SpO2 96%   BMI 30.18 kg/m   BP Readings from Last 3 Encounters:  09/06/19 129/68  05/17/19 123/63  10/10/18 (!) 116/58    Wt Readings from Last 3 Encounters:  09/06/19 187 lb (84.8 kg)  05/17/19 187 lb (84.8 kg)  10/10/18 191 lb (86.6 kg)     Physical Exam Constitutional:      General: He is not in acute distress.    Appearance: He is well-developed.  HENT:     Head: Normocephalic and atraumatic.     Right Ear: External ear normal.     Left Ear: External ear normal.     Nose: Nose normal.  Eyes:     Conjunctiva/sclera: Conjunctivae normal.     Pupils: Pupils are equal, round, and reactive to light.  Neck:     Musculoskeletal: Normal range of motion and neck supple.  Cardiovascular:     Rate and Rhythm: Normal rate and regular rhythm.  Heart sounds: Normal heart sounds. No murmur.  Pulmonary:     Effort: Pulmonary effort is normal. No respiratory distress.     Breath sounds: Normal breath sounds. No wheezing or rales.  Abdominal:     Palpations: Abdomen is soft.     Tenderness: There is no abdominal tenderness.  Musculoskeletal: Normal range of motion.   Skin:    General: Skin is warm and dry.  Neurological:     Mental Status: He is alert and oriented to person, place, and time.     Deep Tendon Reflexes: Reflexes are normal and symmetric.  Psychiatric:        Behavior: Behavior normal.        Thought Content: Thought content normal.        Judgment: Judgment normal.       Assessment & Plan:   [name] [name] was seen today for establish care.  Diagnoses and all orders for this visit:  Atherosclerosis of native coronary artery of native heart without angina pectoris -     CBC with Differential/Platelet -     CMP14+EGFR -     Lipid panel  Need for immunization against influenza -     Flu Vaccine QUAD High Dose(Fluad)  HYPERCHOLESTEROLEMIA -     CBC with Differential/Platelet -     CMP14+EGFR -     Lipid panel  Essential hypertension, benign -     CBC with Differential/Platelet -     CMP14+EGFR -     Lipid panel  Gastroesophageal reflux disease with esophagitis without hemorrhage -     CBC with Differential/Platelet -     CMP14+EGFR -     Lipid panel  Screening for prostate cancer -     PSA Total (Reflex To Free)  Vitamin D deficiency -     VITAMIN D 25 Hydroxy (Vit-D Deficiency, Fractures)  Other orders -     Discontinue: mometasone (ELOCON) 0.1 % cream; Apply 1 application topically daily. To affected areas around eyes -     mometasone (ELOCON) 0.1 % cream; Apply 1 application topically daily. To affected areas around eyes       I have discontinued Juleen China K. Easterly's bacitracin. I am also having him maintain his finasteride, acetaminophen, dorzolamide-timolol, cholecalciferol, latanoprost, brimonidine, dorzolamide, nitroGLYCERIN, atorvastatin, lisinopril, carvedilol, clopidogrel, pantoprazole, amLODipine, erythromycin, Olopatadine HCl (PATADAY OP), trimethoprim-polymyxin b, and mometasone.  Allergies as of 09/06/2019      Reactions   Amoxicillin    Other reaction(s): Unknown      Medication List        Accurate as of September 06, 2019 11:59 PM. If you have any questions, ask your nurse or doctor.        STOP taking these medications   bacitracin ophthalmic ointment Stopped by: [name], MD     TAKE these medications   acetaminophen 500 MG tablet Commonly known as: TYLENOL Take 1,000 mg by mouth every 6 (six) hours as needed for mild pain or moderate pain.   amLODipine 10 MG tablet Commonly known as: NORVASC TAKE 1 TABLET AT BEDTIME   atorvastatin 20 MG tablet Commonly known as: LIPITOR TAKE 1 TABLET EVERY DAY   brimonidine 0.2 % ophthalmic solution Commonly known as: ALPHAGAN INSTILL 1 DROP INTO RIGHT EYE TWICE A DAY   carvedilol 12.5 MG tablet Commonly known as: COREG TAKE 1 TABLET TWICE DAILY WITH MEALS   cholecalciferol 1000 units tablet Commonly known as: VITAMIN D Take 1,000 Units by mouth daily.   clopidogrel  75 MG tablet Commonly known as: PLAVIX TAKE 1 TABLET EVERY DAY   dorzolamide 2 % ophthalmic solution Commonly known as: TRUSOPT Apply to eye.   dorzolamide-timolol 22.3-6.8 MG/ML ophthalmic solution Commonly known as: COSOPT Place 1 drop into both eyes 2 (two) times daily.   erythromycin ophthalmic ointment Apply  a small amount  twice a day as directed   finasteride 5 MG tablet Commonly known as: PROSCAR Take 5 mg by mouth daily.   latanoprost 0.005 % ophthalmic solution Commonly known as: XALATAN Place 1 drop into both eyes at bedtime.   lisinopril 30 MG tablet Commonly known as: ZESTRIL TAKE 1 TABLET EVERY DAY   mometasone 0.1 % cream Commonly known as: Elocon Apply 1 application topically daily. To affected areas around eyes Started by: [name], MD   nitroGLYCERIN 0.4 MG SL tablet Commonly known as: Nitrostat Place 1 tablet (0.4 mg total) under the tongue every 5 (five) minutes as needed for chest pain.   pantoprazole 40 MG tablet Commonly known as: PROTONIX TAKE 1 TABLET EVERY DAY   PATADAY OP Apply to eye.    trimethoprim-polymyxin b ophthalmic solution Commonly known as: POLYTRIM every 4 (four) hours.        Follow-up: Return in about 6 months (around 03/06/2020).  [name], M.D.

## 2019-09-07 LAB — CBC WITH DIFFERENTIAL/PLATELET
Basophils Absolute: 0.1 10*3/uL (ref 0.0–0.2)
Basos: 1 %
EOS (ABSOLUTE): 0.3 10*3/uL (ref 0.0–0.4)
Eos: 5 %
Hematocrit: 44.3 % (ref 37.5–51.0)
Hemoglobin: 14.2 g/dL (ref 13.0–17.7)
Immature Grans (Abs): 0 10*3/uL (ref 0.0–0.1)
Immature Granulocytes: 0 %
Lymphocytes Absolute: 1.4 10*3/uL (ref 0.7–3.1)
Lymphs: 23 %
MCH: 26.8 pg (ref 26.6–33.0)
MCHC: 32.1 g/dL (ref 31.5–35.7)
MCV: 84 fL (ref 79–97)
Monocytes Absolute: 0.6 10*3/uL (ref 0.1–0.9)
Monocytes: 10 %
Neutrophils Absolute: 3.7 10*3/uL (ref 1.4–7.0)
Neutrophils: 61 %
Platelets: 187 10*3/uL (ref 150–450)
RBC: 5.29 x10E6/uL (ref 4.14–5.80)
RDW: 13.9 % (ref 11.6–15.4)
WBC: 6.1 10*3/uL (ref 3.4–10.8)

## 2019-09-07 LAB — LIPID PANEL
Chol/HDL Ratio: 2.9 ratio (ref 0.0–5.0)
Cholesterol, Total: 120 mg/dL (ref 100–199)
HDL: 42 mg/dL (ref >39)
LDL Chol Calc (NIH): 53 mg/dL (ref 0–99)
Triglycerides: 143 mg/dL (ref 0–149)
VLDL Cholesterol Cal: 25 mg/dL (ref 5–40)

## 2019-09-07 LAB — CMP14+EGFR
ALT: 11 IU/L (ref 0–44)
AST: 15 IU/L (ref 0–40)
Albumin/Globulin Ratio: 1.4 (ref 1.2–2.2)
Albumin: 3.5 g/dL — ABNORMAL LOW (ref 3.6–4.6)
Alkaline Phosphatase: 83 IU/L (ref 39–117)
BUN/Creatinine Ratio: 11 (ref 10–24)
BUN: 15 mg/dL (ref 8–27)
Bilirubin Total: 0.5 mg/dL (ref 0.0–1.2)
CO2: 22 mmol/L (ref 20–29)
Calcium: 9.1 mg/dL (ref 8.6–10.2)
Chloride: 104 mmol/L (ref 96–106)
Creatinine, Ser: 1.39 mg/dL — ABNORMAL HIGH (ref 0.76–1.27)
GFR calc Af Amer: 55 mL/min/{1.73_m2} — ABNORMAL LOW (ref >59)
GFR calc non Af Amer: 47 mL/min/{1.73_m2} — ABNORMAL LOW (ref >59)
Globulin, Total: 2.5 g/dL (ref 1.5–4.5)
Glucose: 88 mg/dL (ref 65–99)
Potassium: 4.6 mmol/L (ref 3.5–5.2)
Sodium: 139 mmol/L (ref 134–144)
Total Protein: 6 g/dL (ref 6.0–8.5)

## 2019-09-07 LAB — VITAMIN D 25 HYDROXY (VIT D DEFICIENCY, FRACTURES): Vit D, 25-Hydroxy: 37.1 ng/mL (ref 30.0–100.0)

## 2019-09-07 LAB — PSA TOTAL (REFLEX TO FREE): Prostate Specific Ag, Serum: 1.4 ng/mL (ref 0.0–4.0)

## 2019-09-07 NOTE — Progress Notes (Signed)
Hello Azai,  Your lab result is normal and/or stable.Some minor variations that are not significant are commonly marked abnormal, but do not represent any medical problem for you.  Best regards, Camiyah Friberg, M.D.

## 2019-09-26 DIAGNOSIS — M722 Plantar fascial fibromatosis: Secondary | ICD-10-CM | POA: Diagnosis not present

## 2019-09-26 DIAGNOSIS — M79672 Pain in left foot: Secondary | ICD-10-CM | POA: Diagnosis not present

## 2019-10-02 ENCOUNTER — Encounter (INDEPENDENT_AMBULATORY_CARE_PROVIDER_SITE_OTHER): Payer: Medicare HMO | Admitting: Ophthalmology

## 2019-10-02 DIAGNOSIS — H35033 Hypertensive retinopathy, bilateral: Secondary | ICD-10-CM | POA: Diagnosis not present

## 2019-10-02 DIAGNOSIS — H43813 Vitreous degeneration, bilateral: Secondary | ICD-10-CM

## 2019-10-02 DIAGNOSIS — I10 Essential (primary) hypertension: Secondary | ICD-10-CM | POA: Diagnosis not present

## 2019-10-02 DIAGNOSIS — H34812 Central retinal vein occlusion, left eye, with macular edema: Secondary | ICD-10-CM

## 2019-10-02 DIAGNOSIS — D331 Benign neoplasm of brain, infratentorial: Secondary | ICD-10-CM

## 2019-10-02 DIAGNOSIS — H353112 Nonexudative age-related macular degeneration, right eye, intermediate dry stage: Secondary | ICD-10-CM | POA: Diagnosis not present

## 2019-10-17 DIAGNOSIS — M722 Plantar fascial fibromatosis: Secondary | ICD-10-CM | POA: Diagnosis not present

## 2019-10-17 DIAGNOSIS — M79672 Pain in left foot: Secondary | ICD-10-CM | POA: Diagnosis not present

## 2019-10-19 ENCOUNTER — Other Ambulatory Visit: Payer: Self-pay

## 2019-10-19 DIAGNOSIS — Z20822 Contact with and (suspected) exposure to covid-19: Secondary | ICD-10-CM

## 2019-10-20 LAB — NOVEL CORONAVIRUS, NAA: SARS-CoV-2, NAA: NOT DETECTED

## 2019-11-13 ENCOUNTER — Encounter (INDEPENDENT_AMBULATORY_CARE_PROVIDER_SITE_OTHER): Payer: Medicare HMO | Admitting: Ophthalmology

## 2019-11-13 DIAGNOSIS — H35033 Hypertensive retinopathy, bilateral: Secondary | ICD-10-CM

## 2019-11-13 DIAGNOSIS — H353112 Nonexudative age-related macular degeneration, right eye, intermediate dry stage: Secondary | ICD-10-CM

## 2019-11-13 DIAGNOSIS — H43813 Vitreous degeneration, bilateral: Secondary | ICD-10-CM

## 2019-11-13 DIAGNOSIS — H34812 Central retinal vein occlusion, left eye, with macular edema: Secondary | ICD-10-CM

## 2019-11-13 DIAGNOSIS — I10 Essential (primary) hypertension: Secondary | ICD-10-CM | POA: Diagnosis not present

## 2019-12-19 ENCOUNTER — Encounter (INDEPENDENT_AMBULATORY_CARE_PROVIDER_SITE_OTHER): Payer: Medicare HMO | Admitting: Ophthalmology

## 2019-12-19 DIAGNOSIS — H35033 Hypertensive retinopathy, bilateral: Secondary | ICD-10-CM | POA: Diagnosis not present

## 2019-12-19 DIAGNOSIS — I10 Essential (primary) hypertension: Secondary | ICD-10-CM | POA: Diagnosis not present

## 2019-12-19 DIAGNOSIS — H34812 Central retinal vein occlusion, left eye, with macular edema: Secondary | ICD-10-CM | POA: Diagnosis not present

## 2019-12-19 DIAGNOSIS — H43813 Vitreous degeneration, bilateral: Secondary | ICD-10-CM

## 2019-12-19 DIAGNOSIS — D3131 Benign neoplasm of right choroid: Secondary | ICD-10-CM

## 2019-12-19 DIAGNOSIS — H353112 Nonexudative age-related macular degeneration, right eye, intermediate dry stage: Secondary | ICD-10-CM | POA: Diagnosis not present

## 2019-12-29 ENCOUNTER — Telehealth: Payer: Self-pay | Admitting: Cardiology

## 2019-12-29 NOTE — Telephone Encounter (Signed)

## 2019-12-31 NOTE — Progress Notes (Signed)
Virtual Visit via Telephone Note   This visit type was conducted due to national recommendations for restrictions regarding the COVID-19 Pandemic (e.g. social distancing) in an effort to limit this patient's exposure and mitigate transmission in our community.  Due to his co-morbid illnesses, this patient is at least at moderate risk for complications without adequate follow up.  This format is felt to be most appropriate for this patient at this time.  The patient did not have access to video technology/had technical difficulties with video requiring transitioning to audio format only (telephone).  All issues noted in this document were discussed and addressed.  No physical exam could be performed with this format.  Please refer to the patient's chart for his  consent to telehealth for [facility].   Date:  01/01/2020   ID:  [name] [name], DOB December 31, 1935, MRN [id number]  Patient Location: Home Provider Location: Home  PCP:  [name], MD  Cardiologist:  Rozann Lesches, MD Electrophysiologist:  None   Evaluation Performed:  Follow-Up Visit  Chief Complaint:  Cardiac follow-up  History of Present Illness:    [name] [name] is an 84 y.o. male last seen in July 2020.  We spoke by phone today.  He does not report any active angina or nitroglycerin use since last encounter.  He does not report any problems with his current medications.  I went over his current cardiac regimen which is outlined below.  He does not report any spontaneous bleeding problems on Plavix.  I reviewed his interval lab work from [facility] as outlined below.  Most recent LDL was 53.  The patient does not have symptoms concerning for COVID-19 infection (fever, chills, cough, or new shortness of breath).  He and his wife have both had the first dose of the vaccine.   Past Medical History:  Diagnosis Date  . Arthritis   . CHF (congestive heart failure) ([place])   . Chronic constipation   . Coronary  atherosclerosis of native coronary artery    a. s/p CABG in 2002 b. low-risk NST in 2013  . Diverticulosis 2007   Colonoscopy  . Essential hypertension   . Fatty liver disease, nonalcoholic   . GERD (gastroesophageal reflux disease)   . Headache(784.0)   . Hiatal hernia   . Ischemic cardiomyopathy    a. LVEF previously reduced to 35% with normalization since by review of prior notes  . Mixed hyperlipidemia   . Myocardial infarction ([place])   . Nephrolithiasis   . Renal insufficiency   . Schatzki's ring    Last EGD/ED Dr Paul Half   Past Surgical History:  Procedure Laterality Date  . CORONARY ARTERY BYPASS GRAFT  2002   LIMA to LAD, SVG to diagonal and ramus, SVG to PDA and PLA  . HEMORRHOID SURGERY    . KIDNEY STONE SURGERY     right  . KNEE ARTHROSCOPY     left  . NEPHRECTOMY     left  . TOTAL KNEE ARTHROPLASTY  08/01/2012   Procedure: TOTAL KNEE ARTHROPLASTY;  Surgeon: Tobi Bastos, MD;  Location: [facility];  Service: Orthopedics;  Laterality: Left;     Current Meds  Medication Sig  . acetaminophen (TYLENOL) 500 MG tablet Take 1,000 mg by mouth every 6 (six) hours as needed for mild pain or moderate pain.  Marland Kitchen amLODipine (NORVASC) 10 MG tablet TAKE 1 TABLET AT BEDTIME  . atorvastatin (LIPITOR) 20 MG tablet TAKE 1 TABLET EVERY DAY  . brimonidine (ALPHAGAN) 0.2 %  ophthalmic solution INSTILL 1 DROP INTO RIGHT EYE TWICE A DAY  . carvedilol (COREG) 12.5 MG tablet TAKE 1 TABLET TWICE DAILY WITH MEALS  . cholecalciferol (VITAMIN D) 1000 units tablet Take 1,000 Units by mouth daily.  . clopidogrel (PLAVIX) 75 MG tablet TAKE 1 TABLET EVERY DAY  . dorzolamide (TRUSOPT) 2 % ophthalmic solution Apply to eye.  . dorzolamide-timolol (COSOPT) 22.3-6.8 MG/ML ophthalmic solution Place 1 drop into both eyes 2 (two) times daily.   Marland Kitchen erythromycin ophthalmic ointment Apply  a small amount  twice a day as directed  . finasteride (PROSCAR) 5 MG tablet Take 5 mg by mouth daily.  Marland Kitchen latanoprost  (XALATAN) 0.005 % ophthalmic solution Place 1 drop into both eyes at bedtime.  Marland Kitchen lisinopril (ZESTRIL) 30 MG tablet TAKE 1 TABLET EVERY DAY  . mometasone (ELOCON) 0.1 % cream Apply 1 application topically daily. To affected areas around eyes  . nitroGLYCERIN (NITROSTAT) 0.4 MG SL tablet Place 1 tablet (0.4 mg total) under the tongue every 5 (five) minutes as needed for chest pain.  Marland Kitchen Olopatadine HCl (PATADAY OP) Apply to eye.  . pantoprazole (PROTONIX) 40 MG tablet TAKE 1 TABLET EVERY DAY  . trimethoprim-polymyxin b (POLYTRIM) ophthalmic solution every 4 (four) hours.     Allergies:   Amoxicillin   ROS:   Chronic hearing loss.   Prior CV studies:   The following studies were reviewed today:  Lexiscan Myoview 02/21/2018:  Thinning with decreased tracer activity in the inferior and apical walls consistent with probable scar and soft tissue attenuation (diaphragm) No ischemia  This is an intermediate risk study.  Nuclear stress EF: 48%.  Labs/Other Tests and Data Reviewed:    EKG:  An ECG dated 05/17/2019 was personally reviewed today and demonstrated:  Sinus bradycardia with left bundle branch block.  Recent Labs: 09/06/2019: ALT 11; BUN 15; Creatinine, Ser 1.39; Hemoglobin 14.2; Platelets 187; Potassium 4.6; Sodium 139   Recent Lipid Panel Lab Results  Component Value Date/Time   CHOL 120 09/06/2019 02:00 PM   TRIG 143 09/06/2019 02:00 PM   HDL 42 09/06/2019 02:00 PM   CHOLHDL 2.9 09/06/2019 02:00 PM   LDLCALC 53 09/06/2019 02:00 PM    Wt Readings from Last 3 Encounters:  01/01/20 181 lb (82.1 kg)  09/06/19 187 lb (84.8 kg)  05/17/19 187 lb (84.8 kg)     Objective:    Vital Signs:  BP 122/66   Pulse (!) 56   Ht 5\' 4"  (1.626 m)   Wt 181 lb (82.1 kg)   BMI 31.07 kg/m    Patient spoke in full sentences, not short of breath. No audible wheezing or coughing. Speech pattern normal.  ASSESSMENT & PLAN:    1.  Multivessel CAD status post CABG.  He has graft disease  that is being managed medically and is stable in terms of angina symptoms.  Continue Plavix, Norvasc, Lipitor, Coreg, and lisinopril.  He has as needed nitroglycerin available.  2.  Mixed hyperlipidemia, tolerating Lipitor.  Recent LDL 53.  3.  Essential hypertension, blood pressure is well controlled today.  No changes made to current regimen.   Time:   Today, I have spent 5 minutes with the patient with telehealth technology discussing the above problems.     Medication Adjustments/Labs and Tests Ordered: Current medicines are reviewed at length with the patient today.  Concerns regarding medicines are outlined above.   Tests Ordered: No orders of the defined types were placed in this encounter.  Medication Changes: No orders of the defined types were placed in this encounter.   Follow Up:  In Person 6 months in the [place] office.  Signed, Rozann Lesches, MD  01/01/2020 10:09 AM    [place]

## 2020-01-01 ENCOUNTER — Encounter: Payer: Self-pay | Admitting: Cardiology

## 2020-01-01 ENCOUNTER — Telehealth (INDEPENDENT_AMBULATORY_CARE_PROVIDER_SITE_OTHER): Payer: Medicare HMO | Admitting: Cardiology

## 2020-01-01 VITALS — BP 122/66 | HR 56 | Ht 64.0 in | Wt 181.0 lb

## 2020-01-01 DIAGNOSIS — I10 Essential (primary) hypertension: Secondary | ICD-10-CM

## 2020-01-01 DIAGNOSIS — I25119 Atherosclerotic heart disease of native coronary artery with unspecified angina pectoris: Secondary | ICD-10-CM | POA: Diagnosis not present

## 2020-01-01 DIAGNOSIS — E782 Mixed hyperlipidemia: Secondary | ICD-10-CM | POA: Diagnosis not present

## 2020-01-01 NOTE — Patient Instructions (Signed)
Medication Instructions:  Your physician recommends that you continue on your current medications as directed. Please refer to the Current Medication list given to you today.  *If you need a refill on your cardiac medications before your next appointment, please call your pharmacy*  Lab Work: NONE  If you have labs (blood work) drawn today and your tests are completely normal, you will receive your results only by: . MyChart Message (if you have MyChart) OR . A paper copy in the mail If you have any lab test that is abnormal or we need to change your treatment, we will call you to review the results.  Testing/Procedures: NONE   Follow-Up: At [facility], you and your health needs are our priority.  As part of our continuing mission to provide you with exceptional heart care, we have created designated Provider Care Teams.  These Care Teams include your primary Cardiologist (physician) and Advanced Practice Providers (APPs -  Physician Assistants and Nurse Practitioners) who all work together to provide you with the care you need, when you need it.  Your next appointment:   6 month(s)  The format for your next appointment:   In Person  Provider:   Samuel McDowell, MD  Other Instructions Thank you for choosing [facility]!    

## 2020-01-15 DIAGNOSIS — H0102A Squamous blepharitis right eye, upper and lower eyelids: Secondary | ICD-10-CM | POA: Diagnosis not present

## 2020-01-15 DIAGNOSIS — H353111 Nonexudative age-related macular degeneration, right eye, early dry stage: Secondary | ICD-10-CM | POA: Diagnosis not present

## 2020-01-15 DIAGNOSIS — H0102B Squamous blepharitis left eye, upper and lower eyelids: Secondary | ICD-10-CM | POA: Diagnosis not present

## 2020-01-15 DIAGNOSIS — H43822 Vitreomacular adhesion, left eye: Secondary | ICD-10-CM | POA: Diagnosis not present

## 2020-01-15 DIAGNOSIS — D3131 Benign neoplasm of right choroid: Secondary | ICD-10-CM | POA: Diagnosis not present

## 2020-01-15 DIAGNOSIS — H1045 Other chronic allergic conjunctivitis: Secondary | ICD-10-CM | POA: Diagnosis not present

## 2020-01-15 DIAGNOSIS — H01131 Eczematous dermatitis of right upper eyelid: Secondary | ICD-10-CM | POA: Diagnosis not present

## 2020-01-15 DIAGNOSIS — H401133 Primary open-angle glaucoma, bilateral, severe stage: Secondary | ICD-10-CM | POA: Diagnosis not present

## 2020-01-15 DIAGNOSIS — H353221 Exudative age-related macular degeneration, left eye, with active choroidal neovascularization: Secondary | ICD-10-CM | POA: Diagnosis not present

## 2020-01-23 ENCOUNTER — Encounter (INDEPENDENT_AMBULATORY_CARE_PROVIDER_SITE_OTHER): Payer: Medicare HMO | Admitting: Ophthalmology

## 2020-01-23 DIAGNOSIS — H43813 Vitreous degeneration, bilateral: Secondary | ICD-10-CM

## 2020-01-23 DIAGNOSIS — I10 Essential (primary) hypertension: Secondary | ICD-10-CM

## 2020-01-23 DIAGNOSIS — H353112 Nonexudative age-related macular degeneration, right eye, intermediate dry stage: Secondary | ICD-10-CM

## 2020-01-23 DIAGNOSIS — H35033 Hypertensive retinopathy, bilateral: Secondary | ICD-10-CM | POA: Diagnosis not present

## 2020-01-23 DIAGNOSIS — H34812 Central retinal vein occlusion, left eye, with macular edema: Secondary | ICD-10-CM | POA: Diagnosis not present

## 2020-01-23 DIAGNOSIS — D3131 Benign neoplasm of right choroid: Secondary | ICD-10-CM | POA: Diagnosis not present

## 2020-01-31 ENCOUNTER — Other Ambulatory Visit: Payer: Self-pay | Admitting: Cardiology

## 2020-02-09 ENCOUNTER — Other Ambulatory Visit: Payer: Self-pay | Admitting: *Deleted

## 2020-02-09 MED ORDER — PANTOPRAZOLE SODIUM 40 MG PO TBEC: 40.0000 mg | DELAYED_RELEASE_TABLET | Freq: Every day | ORAL | 1 refills | Status: DC

## 2020-02-20 ENCOUNTER — Encounter (INDEPENDENT_AMBULATORY_CARE_PROVIDER_SITE_OTHER): Payer: Medicare HMO | Admitting: Ophthalmology

## 2020-02-20 DIAGNOSIS — I10 Essential (primary) hypertension: Secondary | ICD-10-CM

## 2020-02-20 DIAGNOSIS — H43813 Vitreous degeneration, bilateral: Secondary | ICD-10-CM

## 2020-02-20 DIAGNOSIS — H353132 Nonexudative age-related macular degeneration, bilateral, intermediate dry stage: Secondary | ICD-10-CM

## 2020-02-20 DIAGNOSIS — H35033 Hypertensive retinopathy, bilateral: Secondary | ICD-10-CM | POA: Diagnosis not present

## 2020-02-20 DIAGNOSIS — H34812 Central retinal vein occlusion, left eye, with macular edema: Secondary | ICD-10-CM | POA: Diagnosis not present

## 2020-02-20 DIAGNOSIS — D3131 Benign neoplasm of right choroid: Secondary | ICD-10-CM

## 2020-02-26 ENCOUNTER — Telehealth: Payer: Self-pay | Admitting: Family Medicine

## 2020-02-26 NOTE — Chronic Care Management (AMB) (Signed)
  Chronic Care Management   Note  02/26/2020 Name: [name] [name] MRN: [id number] DOB: 10/22/1936  [name] [name] is a 84 y.o. year old male who is a primary care patient of Stacks, Cletus Gash, MD. I reached out to [name] by phone today in response to a referral sent by [name]'s health plan.     [name] was given information about Chronic Care Management services today including:  1. CCM service includes personalized support from designated clinical staff supervised by his physician, including individualized plan of care and coordination with other care providers 2. 24/7 contact phone numbers for assistance for urgent and routine care needs. 3. Service will only be billed when office clinical staff spend 20 minutes or more in a month to coordinate care. 4. Only one practitioner may furnish and bill the service in a calendar month. 5. The patient may stop CCM services at any time (effective at the end of the month) by phone call to the office staff. 6. The patient will be responsible for cost sharing (co-pay) of up to 20% of the service fee (after annual deductible is met).  Patient did not agree to enrollment in care management services and does not wish to consider at this time.  Follow up plan: The patient has been provided with contact information for the care management team and has been advised to call with any health related questions or concerns.    Noreene Larsson, [address], [place], [place] [address] Direct Dial: [phone] [email or web address] Website: [email or web address]

## 2020-03-06 ENCOUNTER — Other Ambulatory Visit: Payer: Self-pay

## 2020-03-06 ENCOUNTER — Ambulatory Visit (INDEPENDENT_AMBULATORY_CARE_PROVIDER_SITE_OTHER): Payer: Medicare HMO | Admitting: Family Medicine

## 2020-03-06 ENCOUNTER — Encounter: Payer: Self-pay | Admitting: Family Medicine

## 2020-03-06 VITALS — BP 108/60 | HR 63 | Temp 97.4°F | Resp 20 | Ht 64.0 in | Wt 181.1 lb

## 2020-03-06 DIAGNOSIS — R3914 Feeling of incomplete bladder emptying: Secondary | ICD-10-CM | POA: Diagnosis not present

## 2020-03-06 DIAGNOSIS — N401 Enlarged prostate with lower urinary tract symptoms: Secondary | ICD-10-CM | POA: Diagnosis not present

## 2020-03-06 DIAGNOSIS — I10 Essential (primary) hypertension: Secondary | ICD-10-CM

## 2020-03-06 DIAGNOSIS — K2100 Gastro-esophageal reflux disease with esophagitis, without bleeding: Secondary | ICD-10-CM | POA: Diagnosis not present

## 2020-03-06 DIAGNOSIS — E7800 Pure hypercholesterolemia, unspecified: Secondary | ICD-10-CM | POA: Diagnosis not present

## 2020-03-06 DIAGNOSIS — I2510 Atherosclerotic heart disease of native coronary artery without angina pectoris: Secondary | ICD-10-CM

## 2020-03-06 MED ORDER — CARVEDILOL 12.5 MG PO TABS: 12.5000 mg | ORAL_TABLET | Freq: Two times a day (BID) | ORAL | 3 refills | Status: DC

## 2020-03-06 MED ORDER — NITROGLYCERIN 0.4 MG SL SUBL: 0.4000 mg | SUBLINGUAL_TABLET | SUBLINGUAL | 3 refills | Status: AC | PRN

## 2020-03-06 MED ORDER — AMLODIPINE BESYLATE 10 MG PO TABS: 10.0000 mg | ORAL_TABLET | Freq: Every day | ORAL | 3 refills | Status: AC

## 2020-03-06 MED ORDER — ATORVASTATIN CALCIUM 20 MG PO TABS: 20.0000 mg | ORAL_TABLET | Freq: Every day | ORAL | 3 refills | Status: AC

## 2020-03-06 MED ORDER — PANTOPRAZOLE SODIUM 40 MG PO TBEC: 40.0000 mg | DELAYED_RELEASE_TABLET | Freq: Every day | ORAL | 1 refills | Status: AC

## 2020-03-06 MED ORDER — FINASTERIDE 5 MG PO TABS: 5.0000 mg | ORAL_TABLET | Freq: Every day | ORAL | 1 refills | Status: AC

## 2020-03-06 NOTE — Progress Notes (Signed)
Subjective:  Patient ID: [name] [name], male    DOB: Mar 21, 1936  Age: 84 y.o. MRN: [id number]  CC: Medical Management of Chronic Issues   HPI [name] presents for  follow-up of hypertension. Patient has no history of headache chest pain or shortness of breath or recent cough. Patient also denies symptoms of TIA such as focal numbness or weakness. Patient denies side effects from medication. States taking it regularly.  Patient in for follow-up of GERD. Currently asymptomatic taking  PPI daily. There is no chest pain or heartburn. No hematemesis and no melena. No dysphagia or choking. Onset is remote. Progression is stable. Complicating factors, none.   in for follow-up of elevated cholesterol. Doing well without complaints on current medication. Denies side effects of statin including myalgia and arthralgia and nausea. Currently no chest pain, shortness of breath or other cardiovascular related symptoms noted.     History Karo has a past medical history of Arthritis, CHF (congestive heart failure) ([place]), Chronic constipation, Coronary atherosclerosis of native coronary artery, Diverticulosis (2007), Essential hypertension, Fatty liver disease, nonalcoholic, GERD (gastroesophageal reflux disease), Headache(784.0), Hiatal hernia, Ischemic cardiomyopathy, Mixed hyperlipidemia, Myocardial infarction ([place]), Nephrolithiasis, Renal insufficiency, and Schatzki's ring.   He has a past surgical history that includes Coronary artery bypass graft (2002); Nephrectomy; Kidney stone surgery; Knee arthroscopy; Hemorrhoid surgery; and Total knee arthroplasty (08/01/2012).   His family history includes Coronary artery disease in his father.He reports that he quit smoking about 58 years ago. His smoking use included cigarettes. He started smoking about 69 years ago. He has a 1.00 pack-year smoking history. His smokeless tobacco use includes chew. He reports that he does not drink alcohol or use  drugs.  Current Outpatient Medications on File Prior to Visit  Medication Sig Dispense Refill  . acetaminophen (TYLENOL) 500 MG tablet Take 1,000 mg by mouth every 6 (six) hours as needed for mild pain or moderate pain.    . brimonidine (ALPHAGAN) 0.2 % ophthalmic solution INSTILL 1 DROP INTO RIGHT EYE TWICE A DAY  3  . cholecalciferol (VITAMIN D) 1000 units tablet Take 1,000 Units by mouth daily.    . clopidogrel (PLAVIX) 75 MG tablet TAKE 1 TABLET EVERY DAY 90 tablet 3  . dorzolamide (TRUSOPT) 2 % ophthalmic solution Apply to eye.    . dorzolamide-timolol (COSOPT) 22.3-6.8 MG/ML ophthalmic solution Place 1 drop into both eyes 2 (two) times daily.     Marland Kitchen erythromycin ophthalmic ointment Apply  a small amount  twice a day as directed    . latanoprost (XALATAN) 0.005 % ophthalmic solution Place 1 drop into both eyes at bedtime.    Marland Kitchen lisinopril (ZESTRIL) 30 MG tablet TAKE 1 TABLET EVERY DAY 90 tablet 3  . mometasone (ELOCON) 0.1 % cream Apply 1 application topically daily. To affected areas around eyes 15 g 1  . Olopatadine HCl (PATADAY OP) Apply to eye.    . trimethoprim-polymyxin b (POLYTRIM) ophthalmic solution every 4 (four) hours.     No current facility-administered medications on file prior to visit.    ROS Review of Systems  Constitutional: Negative.   HENT: Negative.   Eyes: Negative for visual disturbance.  Respiratory: Negative for cough and shortness of breath.   Cardiovascular: Negative for chest pain and leg swelling.  Gastrointestinal: Negative for abdominal pain, diarrhea, nausea and vomiting.  Genitourinary: Positive for frequency. Negative for difficulty urinating.  Musculoskeletal: Negative for arthralgias and myalgias.  Skin: Negative for rash.  Neurological: Negative for headaches.  Psychiatric/Behavioral: Negative for sleep disturbance.    Objective:  BP 108/60   Pulse 63   Temp (!) 97.4 F (36.3 C) (Temporal)   Resp 20   Ht 5' 4"  (1.626 m)   Wt 181 lb 2  oz (82.2 kg)   SpO2 97%   BMI 31.09 kg/m   BP Readings from Last 3 Encounters:  03/06/20 108/60  01/01/20 122/66  09/06/19 129/68    Wt Readings from Last 3 Encounters:  03/06/20 181 lb 2 oz (82.2 kg)  01/01/20 181 lb (82.1 kg)  09/06/19 187 lb (84.8 kg)     Physical Exam Vitals reviewed.  Constitutional:      Appearance: He is well-developed.  HENT:     Head: Normocephalic and atraumatic.     Right Ear: Tympanic membrane and external ear normal. No decreased hearing noted.     Left Ear: Tympanic membrane and external ear normal. No decreased hearing noted.     Mouth/Throat:     Pharynx: No oropharyngeal exudate or posterior oropharyngeal erythema.  Eyes:     Pupils: Pupils are equal, round, and reactive to light.  Cardiovascular:     Rate and Rhythm: Normal rate and regular rhythm.     Heart sounds: No murmur.  Pulmonary:     Effort: No respiratory distress.     Breath sounds: Normal breath sounds.  Abdominal:     General: Bowel sounds are normal.     Palpations: Abdomen is soft. There is no mass.     Tenderness: There is no abdominal tenderness.  Musculoskeletal:     Cervical back: Normal range of motion and neck supple.       Assessment & Plan:   Izick was seen today for medical management of chronic issues.  Diagnoses and all orders for this visit:  Essential hypertension, benign -     CBC with Differential/Platelet -     CMP14+EGFR -     Lipid panel -     amLODipine (NORVASC) 10 MG tablet; Take 1 tablet (10 mg total) by mouth at bedtime. -     carvedilol (COREG) 12.5 MG tablet; Take 1 tablet (12.5 mg total) by mouth 2 (two) times daily with a meal. -     nitroGLYCERIN (NITROSTAT) 0.4 MG SL tablet; Place 1 tablet (0.4 mg total) under the tongue every 5 (five) minutes as needed for chest pain.  HYPERCHOLESTEROLEMIA -     CBC with Differential/Platelet -     CMP14+EGFR -     Lipid panel -     atorvastatin (LIPITOR) 20 MG tablet; Take 1 tablet (20  mg total) by mouth daily.  Atherosclerosis of native coronary artery of native heart without angina pectoris -     CBC with Differential/Platelet -     CMP14+EGFR -     Lipid panel -     carvedilol (COREG) 12.5 MG tablet; Take 1 tablet (12.5 mg total) by mouth 2 (two) times daily with a meal.  Gastroesophageal reflux disease with esophagitis without hemorrhage -     CBC with Differential/Platelet -     CMP14+EGFR -     pantoprazole (PROTONIX) 40 MG tablet; Take 1 tablet (40 mg total) by mouth daily.  Benign prostatic hyperplasia with incomplete bladder emptying -     finasteride (PROSCAR) 5 MG tablet; Take 1 tablet (5 mg total) by mouth daily.   Allergies as of 03/06/2020      Reactions   Amoxicillin    Other reaction(s):  Unknown      Medication List       Accurate as of March 06, 2020 11:59 PM. If you have any questions, ask your nurse or doctor.        acetaminophen 500 MG tablet Commonly known as: TYLENOL Take 1,000 mg by mouth every 6 (six) hours as needed for mild pain or moderate pain.   amLODipine 10 MG tablet Commonly known as: NORVASC Take 1 tablet (10 mg total) by mouth at bedtime.   atorvastatin 20 MG tablet Commonly known as: LIPITOR Take 1 tablet (20 mg total) by mouth daily.   brimonidine 0.2 % ophthalmic solution Commonly known as: ALPHAGAN INSTILL 1 DROP INTO RIGHT EYE TWICE A DAY   carvedilol 12.5 MG tablet Commonly known as: COREG Take 1 tablet (12.5 mg total) by mouth 2 (two) times daily with a meal.   cholecalciferol 1000 units tablet Commonly known as: VITAMIN D Take 1,000 Units by mouth daily.   clopidogrel 75 MG tablet Commonly known as: PLAVIX TAKE 1 TABLET EVERY DAY   dorzolamide 2 % ophthalmic solution Commonly known as: TRUSOPT Apply to eye.   dorzolamide-timolol 22.3-6.8 MG/ML ophthalmic solution Commonly known as: COSOPT Place 1 drop into both eyes 2 (two) times daily.   erythromycin ophthalmic ointment Apply  a small  amount  twice a day as directed   finasteride 5 MG tablet Commonly known as: PROSCAR Take 1 tablet (5 mg total) by mouth daily.   latanoprost 0.005 % ophthalmic solution Commonly known as: XALATAN Place 1 drop into both eyes at bedtime.   lisinopril 30 MG tablet Commonly known as: ZESTRIL TAKE 1 TABLET EVERY DAY   mometasone 0.1 % cream Commonly known as: Elocon Apply 1 application topically daily. To affected areas around eyes   nitroGLYCERIN 0.4 MG SL tablet Commonly known as: Nitrostat Place 1 tablet (0.4 mg total) under the tongue every 5 (five) minutes as needed for chest pain.   pantoprazole 40 MG tablet Commonly known as: PROTONIX Take 1 tablet (40 mg total) by mouth daily.   PATADAY OP Apply to eye.   trimethoprim-polymyxin b ophthalmic solution Commonly known as: POLYTRIM every 4 (four) hours.       Meds ordered this encounter  Medications  . amLODipine (NORVASC) 10 MG tablet    Sig: Take 1 tablet (10 mg total) by mouth at bedtime.    Dispense:  90 tablet    Refill:  3  . atorvastatin (LIPITOR) 20 MG tablet    Sig: Take 1 tablet (20 mg total) by mouth daily.    Dispense:  90 tablet    Refill:  3  . carvedilol (COREG) 12.5 MG tablet    Sig: Take 1 tablet (12.5 mg total) by mouth 2 (two) times daily with a meal.    Dispense:  180 tablet    Refill:  3  . finasteride (PROSCAR) 5 MG tablet    Sig: Take 1 tablet (5 mg total) by mouth daily.    Dispense:  90 tablet    Refill:  1  . nitroGLYCERIN (NITROSTAT) 0.4 MG SL tablet    Sig: Place 1 tablet (0.4 mg total) under the tongue every 5 (five) minutes as needed for chest pain.    Dispense:  25 tablet    Refill:  3  . pantoprazole (PROTONIX) 40 MG tablet    Sig: Take 1 tablet (40 mg total) by mouth daily.    Dispense:  90 tablet    Refill:  1      Follow-up: Return in about 3 months (around 06/05/2020).  [name], M.D.

## 2020-03-07 LAB — CBC WITH DIFFERENTIAL/PLATELET
Basophils Absolute: 0.1 10*3/uL (ref 0.0–0.2)
Basos: 1 %
EOS (ABSOLUTE): 0.3 10*3/uL (ref 0.0–0.4)
Eos: 5 %
Hematocrit: 43.7 % (ref 37.5–51.0)
Hemoglobin: 14.4 g/dL (ref 13.0–17.7)
Immature Grans (Abs): 0 10*3/uL (ref 0.0–0.1)
Immature Granulocytes: 0 %
Lymphocytes Absolute: 1.1 10*3/uL (ref 0.7–3.1)
Lymphs: 19 %
MCH: 27.6 pg (ref 26.6–33.0)
MCHC: 33 g/dL (ref 31.5–35.7)
MCV: 84 fL (ref 79–97)
Monocytes Absolute: 0.6 10*3/uL (ref 0.1–0.9)
Monocytes: 10 %
Neutrophils Absolute: 3.9 10*3/uL (ref 1.4–7.0)
Neutrophils: 65 %
Platelets: 193 10*3/uL (ref 150–450)
RBC: 5.22 x10E6/uL (ref 4.14–5.80)
RDW: 13.3 % (ref 11.6–15.4)
WBC: 6 10*3/uL (ref 3.4–10.8)

## 2020-03-07 LAB — CMP14+EGFR
ALT: 6 IU/L (ref 0–44)
AST: 14 IU/L (ref 0–40)
Albumin/Globulin Ratio: 1.6 (ref 1.2–2.2)
Albumin: 3.3 g/dL — ABNORMAL LOW (ref 3.6–4.6)
Alkaline Phosphatase: 75 IU/L (ref 39–117)
BUN/Creatinine Ratio: 8 — ABNORMAL LOW (ref 10–24)
BUN: 11 mg/dL (ref 8–27)
Bilirubin Total: 0.5 mg/dL (ref 0.0–1.2)
CO2: 22 mmol/L (ref 20–29)
Calcium: 9.1 mg/dL (ref 8.6–10.2)
Chloride: 107 mmol/L — ABNORMAL HIGH (ref 96–106)
Creatinine, Ser: 1.46 mg/dL — ABNORMAL HIGH (ref 0.76–1.27)
GFR calc Af Amer: 51 mL/min/{1.73_m2} — ABNORMAL LOW (ref >59)
GFR calc non Af Amer: 44 mL/min/{1.73_m2} — ABNORMAL LOW (ref >59)
Globulin, Total: 2.1 g/dL (ref 1.5–4.5)
Glucose: 96 mg/dL (ref 65–99)
Potassium: 4.4 mmol/L (ref 3.5–5.2)
Sodium: 141 mmol/L (ref 134–144)
Total Protein: 5.4 g/dL — ABNORMAL LOW (ref 6.0–8.5)

## 2020-03-07 LAB — LIPID PANEL
Chol/HDL Ratio: 2.8 ratio (ref 0.0–5.0)
Cholesterol, Total: 125 mg/dL (ref 100–199)
HDL: 44 mg/dL (ref >39)
LDL Chol Calc (NIH): 63 mg/dL (ref 0–99)
Triglycerides: 97 mg/dL (ref 0–149)
VLDL Cholesterol Cal: 18 mg/dL (ref 5–40)

## 2020-03-07 NOTE — Progress Notes (Signed)
Hello Duaine,  Your lab result is normal and/or stable.Some minor variations that are not significant are commonly marked abnormal, but do not represent any medical problem for you.  Best regards, [name], M.D.

## 2020-03-08 ENCOUNTER — Encounter: Payer: Self-pay | Admitting: *Deleted

## 2020-03-08 ENCOUNTER — Ambulatory Visit: Payer: Medicare HMO

## 2020-03-10 ENCOUNTER — Encounter: Payer: Self-pay | Admitting: Family Medicine

## 2020-03-19 ENCOUNTER — Encounter (INDEPENDENT_AMBULATORY_CARE_PROVIDER_SITE_OTHER): Payer: Medicare HMO | Admitting: Ophthalmology

## 2020-03-19 ENCOUNTER — Other Ambulatory Visit: Payer: Self-pay

## 2020-03-19 DIAGNOSIS — H35033 Hypertensive retinopathy, bilateral: Secondary | ICD-10-CM

## 2020-03-19 DIAGNOSIS — H353112 Nonexudative age-related macular degeneration, right eye, intermediate dry stage: Secondary | ICD-10-CM

## 2020-03-19 DIAGNOSIS — H43813 Vitreous degeneration, bilateral: Secondary | ICD-10-CM | POA: Diagnosis not present

## 2020-03-19 DIAGNOSIS — D3131 Benign neoplasm of right choroid: Secondary | ICD-10-CM | POA: Diagnosis not present

## 2020-03-19 DIAGNOSIS — I10 Essential (primary) hypertension: Secondary | ICD-10-CM

## 2020-03-19 DIAGNOSIS — H34812 Central retinal vein occlusion, left eye, with macular edema: Secondary | ICD-10-CM

## 2020-04-18 ENCOUNTER — Other Ambulatory Visit: Payer: Self-pay

## 2020-04-18 ENCOUNTER — Encounter (INDEPENDENT_AMBULATORY_CARE_PROVIDER_SITE_OTHER): Payer: Medicare HMO | Admitting: Ophthalmology

## 2020-04-18 DIAGNOSIS — H34812 Central retinal vein occlusion, left eye, with macular edema: Secondary | ICD-10-CM | POA: Diagnosis not present

## 2020-04-18 DIAGNOSIS — H35033 Hypertensive retinopathy, bilateral: Secondary | ICD-10-CM

## 2020-04-18 DIAGNOSIS — I10 Essential (primary) hypertension: Secondary | ICD-10-CM

## 2020-04-18 DIAGNOSIS — D3131 Benign neoplasm of right choroid: Secondary | ICD-10-CM | POA: Diagnosis not present

## 2020-04-18 DIAGNOSIS — H353132 Nonexudative age-related macular degeneration, bilateral, intermediate dry stage: Secondary | ICD-10-CM | POA: Diagnosis not present

## 2020-04-18 DIAGNOSIS — H43813 Vitreous degeneration, bilateral: Secondary | ICD-10-CM | POA: Diagnosis not present

## 2020-05-07 ENCOUNTER — Other Ambulatory Visit: Payer: Self-pay

## 2020-05-07 ENCOUNTER — Encounter: Payer: Self-pay | Admitting: Family

## 2020-05-07 ENCOUNTER — Ambulatory Visit (INDEPENDENT_AMBULATORY_CARE_PROVIDER_SITE_OTHER): Payer: Medicare HMO | Admitting: Family

## 2020-05-07 VITALS — BP 140/66 | HR 54 | Temp 97.0°F | Ht 64.0 in | Wt 180.2 lb

## 2020-05-07 DIAGNOSIS — S70352A Superficial foreign body, left thigh, initial encounter: Secondary | ICD-10-CM | POA: Diagnosis not present

## 2020-05-07 DIAGNOSIS — W57XXXA Bitten or stung by nonvenomous insect and other nonvenomous arthropods, initial encounter: Secondary | ICD-10-CM | POA: Diagnosis not present

## 2020-05-07 DIAGNOSIS — Z1839 Other retained organic fragments: Secondary | ICD-10-CM

## 2020-05-07 DIAGNOSIS — S70362A Insect bite (nonvenomous), left thigh, initial encounter: Secondary | ICD-10-CM

## 2020-05-07 MED ORDER — DOXYCYCLINE HYCLATE 100 MG PO TABS: 200.0000 mg | ORAL_TABLET | Freq: Once | ORAL | 0 refills | Status: AC

## 2020-05-07 NOTE — Patient Instructions (Signed)

## 2020-05-07 NOTE — Progress Notes (Signed)
Subjective:    Patient ID: [name] [name], male    DOB: 09/28/1936, 84 y.o.   MRN: [id number]  Chief Complaint  Patient presents with  . Tick Removal    HPI PT presents to the office today with a tick left upper thigh. He states he found it this morning by rubbing his hand and he couldn't see it and could not get it off. He is unsure how long it has been attached. He s  Denies any fever, rash, headache, new joint pain.   Review of Systems  All other systems reviewed and are negative.      Objective:   Physical Exam Vitals reviewed.  Constitutional:      General: He is not in acute distress.    Appearance: He is well-developed.  HENT:     Head: Normocephalic.  Eyes:     General:        Right eye: No discharge.        Left eye: No discharge.     Pupils: Pupils are equal, round, and reactive to light.  Neck:     Thyroid: No thyromegaly.  Cardiovascular:     Rate and Rhythm: Normal rate and regular rhythm.     Heart sounds: Normal heart sounds. No murmur heard.   Pulmonary:     Effort: Pulmonary effort is normal. No respiratory distress.     Breath sounds: Normal breath sounds. No wheezing.  Abdominal:     General: Bowel sounds are normal. There is no distension.     Palpations: Abdomen is soft.     Tenderness: There is no abdominal tenderness.  Musculoskeletal:        General: No tenderness. Normal range of motion.     Cervical back: Normal range of motion and neck supple.  Skin:    General: Skin is warm and dry.     Findings: Rash present. No erythema.     Comments: Inner left thigh tick attached with approx 1.5X1.5 cm erythemas rash  Neurological:     Mental Status: He is alert and oriented to person, place, and time.     Cranial Nerves: No cranial nerve deficit.     Deep Tendon Reflexes: Reflexes are normal and symmetric.  Psychiatric:        Behavior: Behavior normal.        Thought Content: Thought content normal.        Judgment: Judgment normal.       BP 140/66   Pulse (!) 54   Temp (!) 97 F (36.1 C) (Temporal)   Ht 5\' 4"  (1.626 m)   Wt 180 lb 3.2 oz (81.7 kg)   SpO2 99%   BMI 30.93 kg/m        Assessment & Plan:  [name] [name] comes in today with chief complaint of Tick Removal   Diagnosis and orders addressed:  1. Embedded tick of left thigh, initial encounter - doxycycline (VIBRA-TABS) 100 MG tablet; Take 2 tablets (200 mg total) by mouth once for 1 dose.  Dispense: 2 tablet; Refill: 0  2. Tick bite of left thigh, initial encounter - doxycycline (VIBRA-TABS) 100 MG tablet; Take 2 tablets (200 mg total) by mouth once for 1 dose.  Dispense: 2 tablet; Refill: 0  Tick removed today Pt tolerated well -Pt to report any new fever, joint pain, or rash -Wear protective clothing while outside- Long sleeves and long pants -Put insect repellent on all exposed skin and along clothing -Take  a shower as soon as possible after being outside Call if symptoms worsen or do not improve   Evelina Dun, FNP

## 2020-05-16 ENCOUNTER — Encounter (INDEPENDENT_AMBULATORY_CARE_PROVIDER_SITE_OTHER): Payer: Medicare HMO | Admitting: Ophthalmology

## 2020-05-16 ENCOUNTER — Other Ambulatory Visit: Payer: Self-pay

## 2020-05-16 DIAGNOSIS — H43813 Vitreous degeneration, bilateral: Secondary | ICD-10-CM

## 2020-05-16 DIAGNOSIS — H35033 Hypertensive retinopathy, bilateral: Secondary | ICD-10-CM

## 2020-05-16 DIAGNOSIS — I10 Essential (primary) hypertension: Secondary | ICD-10-CM

## 2020-05-16 DIAGNOSIS — H34812 Central retinal vein occlusion, left eye, with macular edema: Secondary | ICD-10-CM

## 2020-05-16 DIAGNOSIS — D3131 Benign neoplasm of right choroid: Secondary | ICD-10-CM | POA: Diagnosis not present

## 2020-05-16 DIAGNOSIS — H353132 Nonexudative age-related macular degeneration, bilateral, intermediate dry stage: Secondary | ICD-10-CM

## 2020-05-22 ENCOUNTER — Other Ambulatory Visit: Payer: Self-pay | Admitting: Cardiology

## 2020-06-17 ENCOUNTER — Encounter (INDEPENDENT_AMBULATORY_CARE_PROVIDER_SITE_OTHER): Payer: Medicare HMO | Admitting: Ophthalmology

## 2020-06-17 ENCOUNTER — Other Ambulatory Visit: Payer: Self-pay

## 2020-06-17 DIAGNOSIS — H34812 Central retinal vein occlusion, left eye, with macular edema: Secondary | ICD-10-CM | POA: Diagnosis not present

## 2020-06-17 DIAGNOSIS — H35033 Hypertensive retinopathy, bilateral: Secondary | ICD-10-CM

## 2020-06-17 DIAGNOSIS — D3131 Benign neoplasm of right choroid: Secondary | ICD-10-CM

## 2020-06-17 DIAGNOSIS — H43813 Vitreous degeneration, bilateral: Secondary | ICD-10-CM

## 2020-06-17 DIAGNOSIS — H353132 Nonexudative age-related macular degeneration, bilateral, intermediate dry stage: Secondary | ICD-10-CM | POA: Diagnosis not present

## 2020-06-17 DIAGNOSIS — I10 Essential (primary) hypertension: Secondary | ICD-10-CM | POA: Diagnosis not present

## 2020-07-10 ENCOUNTER — Other Ambulatory Visit: Payer: Self-pay

## 2020-07-10 ENCOUNTER — Encounter: Payer: Self-pay | Admitting: Cardiology

## 2020-07-10 ENCOUNTER — Ambulatory Visit: Payer: Medicare HMO | Admitting: Cardiology

## 2020-07-10 VITALS — BP 128/68 | HR 48 | Ht 65.0 in | Wt 180.0 lb

## 2020-07-10 DIAGNOSIS — I25119 Atherosclerotic heart disease of native coronary artery with unspecified angina pectoris: Secondary | ICD-10-CM | POA: Diagnosis not present

## 2020-07-10 DIAGNOSIS — E782 Mixed hyperlipidemia: Secondary | ICD-10-CM | POA: Diagnosis not present

## 2020-07-10 MED ORDER — CARVEDILOL 6.25 MG PO TABS: 6.2500 mg | ORAL_TABLET | Freq: Two times a day (BID) | ORAL | 3 refills | Status: AC

## 2020-07-10 NOTE — Patient Instructions (Signed)
Medication Instructions:  DECREASE Coreg to 6.25 mg twice a day   *If you need a refill on your cardiac medications before your next appointment, please call your pharmacy*   Lab Work: None today If you have labs (blood work) drawn today and your tests are completely normal, you will receive your results only by: Marland Kitchen MyChart Message (if you have MyChart) OR . A paper copy in the mail If you have any lab test that is abnormal or we need to change your treatment, we will call you to review the results.   Testing/Procedures: None today   Follow-Up: At [facility], you and your health needs are our priority.  As part of our continuing mission to provide you with exceptional heart care, we have created designated Provider Care Teams.  These Care Teams include your primary Cardiologist (physician) and Advanced Practice Providers (APPs -  Physician Assistants and Nurse Practitioners) who all work together to provide you with the care you need, when you need it.  We recommend signing up for the patient portal called "MyChart".  Sign up information is provided on this After Visit Summary.  MyChart is used to connect with patients for Virtual Visits (Telemedicine).  Patients are able to view lab/test results, encounter notes, upcoming appointments, etc.  Non-urgent messages can be sent to your provider as well.   To learn more about what you can do with MyChart, go to [email or web address].    Your next appointment:   6 month(s)  The format for your next appointment:   In Person  Provider:   Rozann Lesches, MD   Other Instructions None         Thank you for choosing [facility] !

## 2020-07-10 NOTE — Progress Notes (Signed)
Cardiology Office Note  Date: 07/10/2020   ID: [name] [name], DOB January 11, 1936, MRN [id number]  PCP:  [name], MD  Cardiologist:  Rozann Lesches, MD Electrophysiologist:  None   Chief Complaint  Patient presents with  . Cardiac follow-up    History of Present Illness: [name] [name] is an 84 y.o. male last assessed via telehealth encounter in February.  He presents for a routine visit.  He does not report any active angina at this time or nitroglycerin use.  Remains functional with ADLs.  I reviewed his interval lab work as noted below.  He remains on Lipitor with last LDL 63.  I personally reviewed his ECG today which shows sinus bradycardia at 48 bpm with left bundle branch block.  He does not describe any dizziness or syncope.  We talked about cutting his Coreg back to 6.25 mg twice daily.  Past Medical History:  Diagnosis Date  . Arthritis   . CHF (congestive heart failure) ([place])   . Chronic constipation   . Coronary atherosclerosis of native coronary artery    a. s/p CABG in 2002 b. low-risk NST in 2013  . Diverticulosis 2007   Colonoscopy  . Essential hypertension   . Fatty liver disease, nonalcoholic   . GERD (gastroesophageal reflux disease)   . Headache(784.0)   . Hiatal hernia   . Ischemic cardiomyopathy    a. LVEF previously reduced to 35% with normalization since by review of prior notes  . Mixed hyperlipidemia   . Myocardial infarction ([place])   . Nephrolithiasis   . Renal insufficiency   . Schatzki's ring    Last EGD/ED Dr Paul Half    Past Surgical History:  Procedure Laterality Date  . CORONARY ARTERY BYPASS GRAFT  2002   LIMA to LAD, SVG to diagonal and ramus, SVG to PDA and PLA  . HEMORRHOID SURGERY    . KIDNEY STONE SURGERY     right  . KNEE ARTHROSCOPY     left  . NEPHRECTOMY     left  . TOTAL KNEE ARTHROPLASTY  08/01/2012   Procedure: TOTAL KNEE ARTHROPLASTY;  Surgeon: Tobi Bastos, MD;  Location: [facility];  Service:  Orthopedics;  Laterality: Left;    Current Outpatient Medications  Medication Sig Dispense Refill  . acetaminophen (TYLENOL) 500 MG tablet Take 1,000 mg by mouth every 6 (six) hours as needed for mild pain or moderate pain.    Marland Kitchen amLODipine (NORVASC) 10 MG tablet Take 1 tablet (10 mg total) by mouth at bedtime. 90 tablet 3  . atorvastatin (LIPITOR) 20 MG tablet Take 1 tablet (20 mg total) by mouth daily. 90 tablet 3  . brimonidine (ALPHAGAN) 0.2 % ophthalmic solution INSTILL 1 DROP INTO RIGHT EYE TWICE A DAY  3  . carvedilol (COREG) 12.5 MG tablet Take 1 tablet (12.5 mg total) by mouth 2 (two) times daily with a meal. 180 tablet 3  . cholecalciferol (VITAMIN D) 1000 units tablet Take 1,000 Units by mouth daily.    . clopidogrel (PLAVIX) 75 MG tablet TAKE 1 TABLET EVERY DAY 90 tablet 3  . dorzolamide (TRUSOPT) 2 % ophthalmic solution Apply to eye.    . dorzolamide-timolol (COSOPT) 22.3-6.8 MG/ML ophthalmic solution Place 1 drop into both eyes 2 (two) times daily.     Marland Kitchen erythromycin ophthalmic ointment Apply  a small amount  twice a day as directed    . finasteride (PROSCAR) 5 MG tablet Take 1 tablet (5 mg total) by mouth daily.  90 tablet 1  . latanoprost (XALATAN) 0.005 % ophthalmic solution Place 1 drop into both eyes at bedtime.    Marland Kitchen lisinopril (ZESTRIL) 30 MG tablet TAKE 1 TABLET EVERY DAY 90 tablet 3  . mometasone (ELOCON) 0.1 % cream Apply 1 application topically daily. To affected areas around eyes 15 g 1  . nitroGLYCERIN (NITROSTAT) 0.4 MG SL tablet Place 1 tablet (0.4 mg total) under the tongue every 5 (five) minutes as needed for chest pain. 25 tablet 3  . Olopatadine HCl (PATADAY OP) Apply to eye.    . pantoprazole (PROTONIX) 40 MG tablet Take 1 tablet (40 mg total) by mouth daily. 90 tablet 1  . trimethoprim-polymyxin b (POLYTRIM) ophthalmic solution every 4 (four) hours.     No current facility-administered medications for this visit.   Allergies:  Amoxicillin   ROS:   No  palpitations or syncope.  Physical Exam: VS:  BP 128/68   Pulse (!) 48   Ht 5\' 5"  (1.651 m)   Wt 180 lb (81.6 kg)   SpO2 97%   BMI 29.95 kg/m , BMI Body mass index is 29.95 kg/m.  Wt Readings from Last 3 Encounters:  07/10/20 180 lb (81.6 kg)  05/07/20 180 lb 3.2 oz (81.7 kg)  03/06/20 181 lb 2 oz (82.2 kg)    General: Elderly male, appears comfortable at rest. HEENT: Conjunctiva and lids normal, wearing a mask. Neck: Supple, no elevated JVP or carotid bruits, no thyromegaly. Lungs: Clear to auscultation, nonlabored breathing at rest. Cardiac: Regular rate and rhythm, no S3 or significant systolic murmur, no pericardial rub. Extremities: Trace ankle edema, distal pulses 2+.  ECG:  An ECG dated 05/17/2019 was personally reviewed today and demonstrated:  Sinus bradycardia with left bundle branch block.  Recent Labwork: 03/06/2020: ALT 6; AST 14; BUN 11; Creatinine, Ser 1.46; Hemoglobin 14.4; Platelets 193; Potassium 4.4; Sodium 141     Component Value Date/Time   CHOL 125 03/06/2020 1012   TRIG 97 03/06/2020 1012   HDL 44 03/06/2020 1012   CHOLHDL 2.8 03/06/2020 1012   LDLCALC 63 03/06/2020 1012    Other Studies Reviewed Today:  Lexiscan Myoview 02/21/2018:  Thinning with decreased tracer activity in the inferior and apical walls consistent with probable scar and soft tissue attenuation (diaphragm) No ischemia  This is an intermediate risk study.  Nuclear stress EF: 48%.  Assessment and Plan:  1.  Multivessel CAD status post CABG.  Follow-up Myoview from 2019 was overall intermediate risk as outlined above, we have continued medical therapy in the absence of accelerating angina.  Continue Plavix, Norvasc, Lipitor, Zestril, and Coreg although reduce the dose to 6.25 mg twice daily given significant bradycardia.  2.  Mixed hyperlipidemia, tolerating Lipitor with interval LDL 63.  Medication Adjustments/Labs and Tests Ordered: Current medicines are reviewed at length with  the patient today.  Concerns regarding medicines are outlined above.   Tests Ordered: Orders Placed This Encounter  Procedures  . EKG 12-Lead    Medication Changes: No orders of the defined types were placed in this encounter.   Disposition:  Follow up 6 months in the [place] office.  Signed, Satira Sark, MD, [facility] 07/10/2020 9:47 AM    [facility] [place]. [address], [place], [place] [address] Phone: [phone]; Fax: [phone]

## 2020-07-16 DIAGNOSIS — H0102A Squamous blepharitis right eye, upper and lower eyelids: Secondary | ICD-10-CM | POA: Diagnosis not present

## 2020-07-16 DIAGNOSIS — D3131 Benign neoplasm of right choroid: Secondary | ICD-10-CM | POA: Diagnosis not present

## 2020-07-16 DIAGNOSIS — H353111 Nonexudative age-related macular degeneration, right eye, early dry stage: Secondary | ICD-10-CM | POA: Diagnosis not present

## 2020-07-16 DIAGNOSIS — H43822 Vitreomacular adhesion, left eye: Secondary | ICD-10-CM | POA: Diagnosis not present

## 2020-07-16 DIAGNOSIS — H0102B Squamous blepharitis left eye, upper and lower eyelids: Secondary | ICD-10-CM | POA: Diagnosis not present

## 2020-07-16 DIAGNOSIS — H26491 Other secondary cataract, right eye: Secondary | ICD-10-CM | POA: Diagnosis not present

## 2020-07-16 DIAGNOSIS — H401133 Primary open-angle glaucoma, bilateral, severe stage: Secondary | ICD-10-CM | POA: Diagnosis not present

## 2020-07-16 DIAGNOSIS — Z961 Presence of intraocular lens: Secondary | ICD-10-CM | POA: Diagnosis not present

## 2020-07-16 DIAGNOSIS — H353221 Exudative age-related macular degeneration, left eye, with active choroidal neovascularization: Secondary | ICD-10-CM | POA: Diagnosis not present

## 2020-07-18 ENCOUNTER — Encounter (INDEPENDENT_AMBULATORY_CARE_PROVIDER_SITE_OTHER): Payer: Medicare HMO | Admitting: Ophthalmology

## 2020-07-25 ENCOUNTER — Encounter (INDEPENDENT_AMBULATORY_CARE_PROVIDER_SITE_OTHER): Payer: Medicare HMO | Admitting: Ophthalmology

## 2020-07-25 ENCOUNTER — Other Ambulatory Visit: Payer: Self-pay

## 2020-07-25 DIAGNOSIS — H34812 Central retinal vein occlusion, left eye, with macular edema: Secondary | ICD-10-CM

## 2020-07-25 DIAGNOSIS — H35033 Hypertensive retinopathy, bilateral: Secondary | ICD-10-CM | POA: Diagnosis not present

## 2020-07-25 DIAGNOSIS — H43813 Vitreous degeneration, bilateral: Secondary | ICD-10-CM

## 2020-07-25 DIAGNOSIS — H353112 Nonexudative age-related macular degeneration, right eye, intermediate dry stage: Secondary | ICD-10-CM

## 2020-07-25 DIAGNOSIS — D3131 Benign neoplasm of right choroid: Secondary | ICD-10-CM

## 2020-07-25 DIAGNOSIS — I10 Essential (primary) hypertension: Secondary | ICD-10-CM

## 2020-08-09 ENCOUNTER — Encounter: Payer: Self-pay | Admitting: *Deleted

## 2020-08-14 ENCOUNTER — Ambulatory Visit (INDEPENDENT_AMBULATORY_CARE_PROVIDER_SITE_OTHER): Payer: Medicare HMO | Admitting: Family Medicine

## 2020-08-14 ENCOUNTER — Encounter: Payer: Self-pay | Admitting: Family Medicine

## 2020-08-14 DIAGNOSIS — B9689 Other specified bacterial agents as the cause of diseases classified elsewhere: Secondary | ICD-10-CM | POA: Diagnosis not present

## 2020-08-14 DIAGNOSIS — R059 Cough, unspecified: Secondary | ICD-10-CM

## 2020-08-14 DIAGNOSIS — J22 Unspecified acute lower respiratory infection: Secondary | ICD-10-CM | POA: Diagnosis not present

## 2020-08-14 MED ORDER — METHYLPREDNISOLONE 4 MG PO TBPK: ORAL_TABLET | ORAL | 0 refills | Status: AC

## 2020-08-14 MED ORDER — ALBUTEROL SULFATE HFA 108 (90 BASE) MCG/ACT IN AERS: 2.0000 | INHALATION_SPRAY | Freq: Four times a day (QID) | RESPIRATORY_TRACT | 0 refills | Status: AC | PRN

## 2020-08-14 MED ORDER — AZITHROMYCIN 250 MG PO TABS: ORAL_TABLET | ORAL | 0 refills | Status: AC

## 2020-08-14 NOTE — Progress Notes (Signed)
Virtual Visit via Telephone Note  I connected with [name] [name] on 08/14/20 at 3:01 PM by telephone and verified that I am speaking with the correct person using two identifiers. [name] [name] is currently located at home and his son is currently with him during this visit. The provider, Loman Brooklyn, FNP is located in their home at time of visit.  I discussed the limitations, risks, security and privacy concerns of performing an evaluation and management service by telephone and the availability of in person appointments. I also discussed with the patient that there may be a patient responsible charge related to this service. The patient expressed understanding and agreed to proceed.  Subjective: PCP: [name], MD  Chief Complaint  Patient presents with  . URI   Patient complains of cough, chest congestion, sneezing, shortness of breath, wheezing and diarrhea. Onset of symptoms was 1 week ago, unchanged since that time. He is drinking plenty of fluids and is urinating per his usual. Evaluation to date: none. Treatment to date: Tylenol. He does not smoke.  Patient reports he was vaccinated against COVID-19.   ROS: Per HPI  Current Outpatient Medications:  .  acetaminophen (TYLENOL) 500 MG tablet, Take 1,000 mg by mouth every 6 (six) hours as needed for mild pain or moderate pain., Disp: , Rfl:  .  amLODipine (NORVASC) 10 MG tablet, Take 1 tablet (10 mg total) by mouth at bedtime., Disp: 90 tablet, Rfl: 3 .  atorvastatin (LIPITOR) 20 MG tablet, Take 1 tablet (20 mg total) by mouth daily., Disp: 90 tablet, Rfl: 3 .  brimonidine (ALPHAGAN) 0.2 % ophthalmic solution, INSTILL 1 DROP INTO RIGHT EYE TWICE A DAY, Disp: , Rfl: 3 .  carvedilol (COREG) 6.25 MG tablet, Take 1 tablet (6.25 mg total) by mouth 2 (two) times daily., Disp: 180 tablet, Rfl: 3 .  cholecalciferol (VITAMIN D) 1000 units tablet, Take 1,000 Units by mouth daily., Disp: , Rfl:  .  clopidogrel (PLAVIX) 75 MG  tablet, TAKE 1 TABLET EVERY DAY, Disp: 90 tablet, Rfl: 3 .  dorzolamide (TRUSOPT) 2 % ophthalmic solution, Apply to eye., Disp: , Rfl:  .  dorzolamide-timolol (COSOPT) 22.3-6.8 MG/ML ophthalmic solution, Place 1 drop into both eyes 2 (two) times daily. , Disp: , Rfl:  .  erythromycin ophthalmic ointment, Apply  a small amount  twice a day as directed, Disp: , Rfl:  .  finasteride (PROSCAR) 5 MG tablet, Take 1 tablet (5 mg total) by mouth daily., Disp: 90 tablet, Rfl: 1 .  latanoprost (XALATAN) 0.005 % ophthalmic solution, Place 1 drop into both eyes at bedtime., Disp: , Rfl:  .  lisinopril (ZESTRIL) 30 MG tablet, TAKE 1 TABLET EVERY DAY, Disp: 90 tablet, Rfl: 3 .  mometasone (ELOCON) 0.1 % cream, Apply 1 application topically daily. To affected areas around eyes, Disp: 15 g, Rfl: 1 .  nitroGLYCERIN (NITROSTAT) 0.4 MG SL tablet, Place 1 tablet (0.4 mg total) under the tongue every 5 (five) minutes as needed for chest pain., Disp: 25 tablet, Rfl: 3 .  Olopatadine HCl (PATADAY OP), Apply to eye., Disp: , Rfl:  .  pantoprazole (PROTONIX) 40 MG tablet, Take 1 tablet (40 mg total) by mouth daily., Disp: 90 tablet, Rfl: 1 .  trimethoprim-polymyxin b (POLYTRIM) ophthalmic solution, every 4 (four) hours., Disp: , Rfl:   Allergies  Allergen Reactions  . Amoxicillin     Other reaction(s): Unknown   Past Medical History:  Diagnosis Date  . Arthritis   .  CHF (congestive heart failure) ([place])   . Chronic constipation   . Coronary atherosclerosis of native coronary artery    a. s/p CABG in 2002 b. low-risk NST in 2013  . Diverticulosis 2007   Colonoscopy  . Essential hypertension   . Fatty liver disease, nonalcoholic   . GERD (gastroesophageal reflux disease)   . Headache(784.0)   . Hiatal hernia   . Ischemic cardiomyopathy    a. LVEF previously reduced to 35% with normalization since by review of prior notes  . Mixed hyperlipidemia   . Myocardial infarction ([place])   . Nephrolithiasis   . Renal  insufficiency   . Schatzki's ring    Last EGD/ED Dr Paul Half    Observations/Objective: A&O  No respiratory distress or wheezing audible over the phone Mood, judgement, and thought processes all WNL  Assessment and Plan: 1. Bacterial lower respiratory infection - Discussed symptom management. - albuterol (VENTOLIN HFA) 108 (90 Base) MCG/ACT inhaler; Inhale 2 puffs into the lungs every 6 (six) hours as needed for wheezing or shortness of breath.  Dispense: 18 g; Refill: 0 - methylPREDNISolone (MEDROL DOSEPAK) 4 MG TBPK tablet; Use as directed.  Dispense: 21 each; Refill: 0 - azithromycin (ZITHROMAX Z-PAK) 250 MG tablet; Take 2 tablets (500 mg) PO today, then 1 tablet (250 mg) PO daily x4 days.  Dispense: 6 tablet; Refill: 0  2. Cough - Novel Coronavirus, NAA ([facility]); Future   Follow Up Instructions:  I discussed the assessment and treatment plan with the patient. The patient was provided an opportunity to ask questions and all were answered. The patient agreed with the plan and demonstrated an understanding of the instructions.   The patient was advised to call back or seek an in-person evaluation if the symptoms worsen or if the condition fails to improve as anticipated.  The above assessment and management plan was discussed with the patient. The patient verbalized understanding of and has agreed to the management plan. Patient is aware to call the clinic if symptoms persist or worsen. Patient is aware when to return to the clinic for a follow-up visit. Patient educated on when it is appropriate to go to the emergency department.   Time call ended: 3:15 PM  I provided 16 minutes of non-face-to-face time during this encounter.  Hendricks Limes, MSN, APRN, FNP-C [facility] 08/14/20

## 2020-08-15 ENCOUNTER — Other Ambulatory Visit: Payer: Self-pay

## 2020-08-15 ENCOUNTER — Emergency Department (HOSPITAL_COMMUNITY): Payer: Medicare HMO

## 2020-08-15 ENCOUNTER — Inpatient Hospital Stay (HOSPITAL_COMMUNITY)
Admission: EM | Admit: 2020-08-15 | Discharge: 2020-09-09 | DRG: 177 | Disposition: E | Payer: Medicare HMO | Attending: Family Medicine | Admitting: Family Medicine

## 2020-08-15 ENCOUNTER — Encounter (HOSPITAL_COMMUNITY): Payer: Self-pay

## 2020-08-15 DIAGNOSIS — R0902 Hypoxemia: Secondary | ICD-10-CM | POA: Diagnosis not present

## 2020-08-15 DIAGNOSIS — R0600 Dyspnea, unspecified: Secondary | ICD-10-CM | POA: Diagnosis not present

## 2020-08-15 DIAGNOSIS — I959 Hypotension, unspecified: Secondary | ICD-10-CM | POA: Diagnosis not present

## 2020-08-15 DIAGNOSIS — Z905 Acquired absence of kidney: Secondary | ICD-10-CM

## 2020-08-15 DIAGNOSIS — N390 Urinary tract infection, site not specified: Secondary | ICD-10-CM | POA: Diagnosis present

## 2020-08-15 DIAGNOSIS — J9601 Acute respiratory failure with hypoxia: Secondary | ICD-10-CM | POA: Diagnosis not present

## 2020-08-15 DIAGNOSIS — R7989 Other specified abnormal findings of blood chemistry: Secondary | ICD-10-CM | POA: Diagnosis not present

## 2020-08-15 DIAGNOSIS — I517 Cardiomegaly: Secondary | ICD-10-CM | POA: Diagnosis not present

## 2020-08-15 DIAGNOSIS — Z515 Encounter for palliative care: Secondary | ICD-10-CM | POA: Diagnosis not present

## 2020-08-15 DIAGNOSIS — J189 Pneumonia, unspecified organism: Secondary | ICD-10-CM | POA: Diagnosis not present

## 2020-08-15 DIAGNOSIS — N400 Enlarged prostate without lower urinary tract symptoms: Secondary | ICD-10-CM | POA: Diagnosis present

## 2020-08-15 DIAGNOSIS — Z79899 Other long term (current) drug therapy: Secondary | ICD-10-CM

## 2020-08-15 DIAGNOSIS — I252 Old myocardial infarction: Secondary | ICD-10-CM | POA: Diagnosis not present

## 2020-08-15 DIAGNOSIS — J90 Pleural effusion, not elsewhere classified: Secondary | ICD-10-CM | POA: Diagnosis not present

## 2020-08-15 DIAGNOSIS — I214 Non-ST elevation (NSTEMI) myocardial infarction: Secondary | ICD-10-CM | POA: Diagnosis not present

## 2020-08-15 DIAGNOSIS — U071 COVID-19: Principal | ICD-10-CM

## 2020-08-15 DIAGNOSIS — I2510 Atherosclerotic heart disease of native coronary artery without angina pectoris: Secondary | ICD-10-CM | POA: Diagnosis present

## 2020-08-15 DIAGNOSIS — J1282 Pneumonia due to coronavirus disease 2019: Secondary | ICD-10-CM | POA: Diagnosis not present

## 2020-08-15 DIAGNOSIS — R0689 Other abnormalities of breathing: Secondary | ICD-10-CM | POA: Diagnosis not present

## 2020-08-15 DIAGNOSIS — I255 Ischemic cardiomyopathy: Secondary | ICD-10-CM | POA: Diagnosis present

## 2020-08-15 DIAGNOSIS — Z6830 Body mass index (BMI) 30.0-30.9, adult: Secondary | ICD-10-CM

## 2020-08-15 DIAGNOSIS — I509 Heart failure, unspecified: Secondary | ICD-10-CM | POA: Diagnosis not present

## 2020-08-15 DIAGNOSIS — Z96652 Presence of left artificial knee joint: Secondary | ICD-10-CM | POA: Diagnosis present

## 2020-08-15 DIAGNOSIS — E860 Dehydration: Secondary | ICD-10-CM | POA: Diagnosis not present

## 2020-08-15 DIAGNOSIS — R9431 Abnormal electrocardiogram [ECG] [EKG]: Secondary | ICD-10-CM | POA: Diagnosis not present

## 2020-08-15 DIAGNOSIS — E46 Unspecified protein-calorie malnutrition: Secondary | ICD-10-CM | POA: Diagnosis present

## 2020-08-15 DIAGNOSIS — Z66 Do not resuscitate: Secondary | ICD-10-CM | POA: Diagnosis not present

## 2020-08-15 DIAGNOSIS — J9811 Atelectasis: Secondary | ICD-10-CM | POA: Diagnosis not present

## 2020-08-15 DIAGNOSIS — N183 Chronic kidney disease, stage 3 unspecified: Secondary | ICD-10-CM

## 2020-08-15 DIAGNOSIS — R339 Retention of urine, unspecified: Secondary | ICD-10-CM | POA: Diagnosis present

## 2020-08-15 DIAGNOSIS — N179 Acute kidney failure, unspecified: Secondary | ICD-10-CM | POA: Diagnosis not present

## 2020-08-15 DIAGNOSIS — N1832 Chronic kidney disease, stage 3b: Secondary | ICD-10-CM | POA: Diagnosis present

## 2020-08-15 DIAGNOSIS — F17220 Nicotine dependence, chewing tobacco, uncomplicated: Secondary | ICD-10-CM | POA: Diagnosis present

## 2020-08-15 DIAGNOSIS — I2699 Other pulmonary embolism without acute cor pulmonale: Secondary | ICD-10-CM | POA: Diagnosis not present

## 2020-08-15 DIAGNOSIS — E440 Moderate protein-calorie malnutrition: Secondary | ICD-10-CM | POA: Diagnosis present

## 2020-08-15 DIAGNOSIS — I130 Hypertensive heart and chronic kidney disease with heart failure and stage 1 through stage 4 chronic kidney disease, or unspecified chronic kidney disease: Secondary | ICD-10-CM | POA: Diagnosis present

## 2020-08-15 DIAGNOSIS — Z951 Presence of aortocoronary bypass graft: Secondary | ICD-10-CM

## 2020-08-15 DIAGNOSIS — N401 Enlarged prostate with lower urinary tract symptoms: Secondary | ICD-10-CM | POA: Diagnosis present

## 2020-08-15 DIAGNOSIS — J159 Unspecified bacterial pneumonia: Secondary | ICD-10-CM | POA: Diagnosis present

## 2020-08-15 DIAGNOSIS — R600 Localized edema: Secondary | ICD-10-CM | POA: Diagnosis not present

## 2020-08-15 DIAGNOSIS — I5023 Acute on chronic systolic (congestive) heart failure: Secondary | ICD-10-CM | POA: Diagnosis present

## 2020-08-15 DIAGNOSIS — Z8249 Family history of ischemic heart disease and other diseases of the circulatory system: Secondary | ICD-10-CM

## 2020-08-15 DIAGNOSIS — I10 Essential (primary) hypertension: Secondary | ICD-10-CM | POA: Diagnosis present

## 2020-08-15 DIAGNOSIS — Z7902 Long term (current) use of antithrombotics/antiplatelets: Secondary | ICD-10-CM

## 2020-08-15 DIAGNOSIS — R0602 Shortness of breath: Secondary | ICD-10-CM | POA: Diagnosis not present

## 2020-08-15 DIAGNOSIS — E782 Mixed hyperlipidemia: Secondary | ICD-10-CM | POA: Diagnosis present

## 2020-08-15 DIAGNOSIS — B9562 Methicillin resistant Staphylococcus aureus infection as the cause of diseases classified elsewhere: Secondary | ICD-10-CM | POA: Diagnosis present

## 2020-08-15 LAB — CBC WITH DIFFERENTIAL/PLATELET
Abs Immature Granulocytes: 0.09 10*3/uL — ABNORMAL HIGH (ref 0.00–0.07)
Abs Immature Granulocytes: 0.06 10*3/uL (ref 0.00–0.07)
Basophils Absolute: 0 10*3/uL (ref 0.0–0.1)
Basophils Absolute: 0 10*3/uL (ref 0.0–0.1)
Basophils Relative: 0 %
Basophils Relative: 0 %
Eosinophils Absolute: 0 10*3/uL (ref 0.0–0.5)
Eosinophils Absolute: 0 10*3/uL (ref 0.0–0.5)
Eosinophils Relative: 0 %
Eosinophils Relative: 0 %
HCT: 40.5 % (ref 39.0–52.0)
HCT: 38.9 % — ABNORMAL LOW (ref 39.0–52.0)
Hemoglobin: 13.2 g/dL (ref 13.0–17.0)
Hemoglobin: 13 g/dL (ref 13.0–17.0)
Immature Granulocytes: 1 %
Immature Granulocytes: 0 %
Lymphocytes Relative: 3 %
Lymphocytes Relative: 2 %
Lymphs Abs: 0.5 10*3/uL — ABNORMAL LOW (ref 0.7–4.0)
Lymphs Abs: 0.3 10*3/uL — ABNORMAL LOW (ref 0.7–4.0)
MCH: 26.6 pg (ref 26.0–34.0)
MCH: 26.9 pg (ref 26.0–34.0)
MCHC: 32.6 g/dL (ref 30.0–36.0)
MCHC: 33.4 g/dL (ref 30.0–36.0)
MCV: 81.7 fL (ref 80.0–100.0)
MCV: 80.4 fL (ref 80.0–100.0)
Monocytes Absolute: 0.6 10*3/uL (ref 0.1–1.0)
Monocytes Absolute: 0.4 10*3/uL (ref 0.1–1.0)
Monocytes Relative: 4 %
Monocytes Relative: 3 %
Neutro Abs: 14.6 10*3/uL — ABNORMAL HIGH (ref 1.7–7.7)
Neutro Abs: 12.7 10*3/uL — ABNORMAL HIGH (ref 1.7–7.7)
Neutrophils Relative %: 92 %
Neutrophils Relative %: 95 %
Platelets: 169 10*3/uL (ref 150–400)
Platelets: 153 10*3/uL (ref 150–400)
RBC: 4.96 MIL/uL (ref 4.22–5.81)
RBC: 4.84 MIL/uL (ref 4.22–5.81)
RDW: 14.2 % (ref 11.5–15.5)
RDW: 14.4 % (ref 11.5–15.5)
WBC: 15.7 10*3/uL — ABNORMAL HIGH (ref 4.0–10.5)
WBC: 13.5 10*3/uL — ABNORMAL HIGH (ref 4.0–10.5)
nRBC: 0 % (ref 0.0–0.2)
nRBC: 0 % (ref 0.0–0.2)

## 2020-08-15 LAB — URINALYSIS, ROUTINE W REFLEX MICROSCOPIC
Bilirubin Urine: NEGATIVE
Glucose, UA: NEGATIVE mg/dL
Ketones, ur: NEGATIVE mg/dL
Nitrite: NEGATIVE
Protein, ur: 30 mg/dL — AB
RBC / HPF: >50 RBC/hpf — ABNORMAL HIGH (ref 0–5)
Specific Gravity, Urine: 1.02 (ref 1.005–1.030)
WBC, UA: >50 WBC/hpf — ABNORMAL HIGH (ref 0–5)
pH: 5 (ref 5.0–8.0)

## 2020-08-15 LAB — COMPREHENSIVE METABOLIC PANEL
ALT: 17 U/L (ref 0–44)
AST: 31 U/L (ref 15–41)
Albumin: 2.5 g/dL — ABNORMAL LOW (ref 3.5–5.0)
Alkaline Phosphatase: 71 U/L (ref 38–126)
Anion gap: 12 (ref 5–15)
BUN: 30 mg/dL — ABNORMAL HIGH (ref 8–23)
CO2: 18 mmol/L — ABNORMAL LOW (ref 22–32)
Calcium: 8.1 mg/dL — ABNORMAL LOW (ref 8.9–10.3)
Chloride: 101 mmol/L (ref 98–111)
Creatinine, Ser: 2.11 mg/dL — ABNORMAL HIGH (ref 0.61–1.24)
GFR calc non Af Amer: 28 mL/min — ABNORMAL LOW (ref >60)
Glucose, Bld: 234 mg/dL — ABNORMAL HIGH (ref 70–99)
Potassium: 4 mmol/L (ref 3.5–5.1)
Sodium: 131 mmol/L — ABNORMAL LOW (ref 135–145)
Total Bilirubin: 1.4 mg/dL — ABNORMAL HIGH (ref 0.3–1.2)
Total Protein: 6.1 g/dL — ABNORMAL LOW (ref 6.5–8.1)

## 2020-08-15 LAB — BLOOD GAS, ARTERIAL
Acid-base deficit: 6.6 mmol/L — ABNORMAL HIGH (ref 0.0–2.0)
Bicarbonate: 18.3 mmol/L — ABNORMAL LOW (ref 20.0–28.0)
FIO2: 100
O2 Saturation: 68 %
Patient temperature: 37
pCO2 arterial: 40.6 mmHg (ref 32.0–48.0)
pH, Arterial: 7.287 — ABNORMAL LOW (ref 7.350–7.450)
pO2, Arterial: 43.1 mmHg — ABNORMAL LOW (ref 83.0–108.0)

## 2020-08-15 LAB — PROTIME-INR
INR: 1.3 — ABNORMAL HIGH (ref 0.8–1.2)
Prothrombin Time: 15.7 seconds — ABNORMAL HIGH (ref 11.4–15.2)

## 2020-08-15 LAB — RESP PANEL BY RT PCR (RSV, FLU A&B, COVID)
Influenza A by PCR: NEGATIVE
Influenza B by PCR: NEGATIVE
Respiratory Syncytial Virus by PCR: NEGATIVE
SARS Coronavirus 2 by RT PCR: POSITIVE — AB

## 2020-08-15 LAB — TROPONIN I (HIGH SENSITIVITY)
Troponin I (High Sensitivity): 327 ng/L (ref <18)
Troponin I (High Sensitivity): 384 ng/L (ref <18)
Troponin I (High Sensitivity): 439 ng/L (ref <18)

## 2020-08-15 LAB — SARS-COV-2, NAA 2 DAY TAT

## 2020-08-15 LAB — FERRITIN: Ferritin: 867 ng/mL — ABNORMAL HIGH (ref 24–336)

## 2020-08-15 LAB — TRIGLYCERIDES: Triglycerides: 134 mg/dL (ref <150)

## 2020-08-15 LAB — NOVEL CORONAVIRUS, NAA: SARS-CoV-2, NAA: DETECTED — AB

## 2020-08-15 LAB — FIBRINOGEN: Fibrinogen: 684 mg/dL — ABNORMAL HIGH (ref 210–475)

## 2020-08-15 LAB — MRSA PCR SCREENING: MRSA by PCR: NEGATIVE

## 2020-08-15 LAB — BRAIN NATRIURETIC PEPTIDE: B Natriuretic Peptide: 1029 pg/mL — ABNORMAL HIGH (ref 0.0–100.0)

## 2020-08-15 LAB — LACTIC ACID, PLASMA
Lactic Acid, Venous: 3.2 mmol/L (ref 0.5–1.9)
Lactic Acid, Venous: 1.6 mmol/L (ref 0.5–1.9)

## 2020-08-15 LAB — LACTATE DEHYDROGENASE: LDH: 352 U/L — ABNORMAL HIGH (ref 98–192)

## 2020-08-15 LAB — D-DIMER, QUANTITATIVE: D-Dimer, Quant: 0.64 ug/mL-FEU — ABNORMAL HIGH (ref 0.00–0.50)

## 2020-08-15 LAB — MAGNESIUM: Magnesium: 1.9 mg/dL (ref 1.7–2.4)

## 2020-08-15 LAB — APTT: aPTT: 33 seconds (ref 24–36)

## 2020-08-15 LAB — C-REACTIVE PROTEIN: CRP: 29 mg/dL — ABNORMAL HIGH (ref <1.0)

## 2020-08-15 LAB — PROCALCITONIN: Procalcitonin: 1.85 ng/mL

## 2020-08-15 MED ORDER — ONDANSETRON HCL 4 MG/2ML IJ SOLN: 4.0000 mg | Freq: Four times a day (QID) | INTRAMUSCULAR | Status: DC | PRN

## 2020-08-15 MED ORDER — DEXAMETHASONE SODIUM PHOSPHATE 10 MG/ML IJ SOLN
6.0000 mg | INTRAMUSCULAR | Status: DC
  Filled 2020-08-15: qty 1

## 2020-08-15 MED ORDER — SODIUM CHLORIDE 0.9 % IV BOLUS
1000.0000 mL | Freq: Once | INTRAVENOUS | Status: AC
  Administered 2020-08-15: 1000 mL via INTRAVENOUS

## 2020-08-15 MED ORDER — ALBUTEROL SULFATE HFA 108 (90 BASE) MCG/ACT IN AERS
2.0000 | INHALATION_SPRAY | Freq: Four times a day (QID) | RESPIRATORY_TRACT | Status: DC
  Administered 2020-08-16 – 2020-08-20 (×16): 2 via RESPIRATORY_TRACT
  Filled 2020-08-15: qty 6.7

## 2020-08-15 MED ORDER — ACETAMINOPHEN 325 MG PO TABS: 650.0000 mg | ORAL_TABLET | Freq: Four times a day (QID) | ORAL | Status: DC | PRN

## 2020-08-15 MED ORDER — SODIUM CHLORIDE 0.9 % IV SOLN
100.0000 mg | Freq: Two times a day (BID) | INTRAVENOUS | Status: DC
  Administered 2020-08-16 (×2): 100 mg via INTRAVENOUS
  Filled 2020-08-15 (×10): qty 100

## 2020-08-15 MED ORDER — HYDRALAZINE HCL 20 MG/ML IJ SOLN: 10.0000 mg | INTRAMUSCULAR | Status: DC | PRN

## 2020-08-15 MED ORDER — FUROSEMIDE 10 MG/ML IJ SOLN
40.0000 mg | Freq: Once | INTRAMUSCULAR | Status: AC
  Administered 2020-08-15: 40 mg via INTRAVENOUS
  Filled 2020-08-15: qty 4

## 2020-08-15 MED ORDER — SODIUM CHLORIDE 0.9 % IV SOLN
100.0000 mg | Freq: Every day | INTRAVENOUS | Status: AC
  Administered 2020-08-16 – 2020-08-19 (×4): 100 mg via INTRAVENOUS
  Filled 2020-08-15 (×4): qty 20

## 2020-08-15 MED ORDER — PANTOPRAZOLE SODIUM 40 MG IV SOLR
40.0000 mg | INTRAVENOUS | Status: DC
  Administered 2020-08-15 – 2020-08-20 (×6): 40 mg via INTRAVENOUS
  Filled 2020-08-15 (×6): qty 40

## 2020-08-15 MED ORDER — DEXAMETHASONE SODIUM PHOSPHATE 10 MG/ML IJ SOLN
6.0000 mg | INTRAMUSCULAR | Status: DC
  Administered 2020-08-16: 6 mg via INTRAVENOUS
  Filled 2020-08-15: qty 1

## 2020-08-15 MED ORDER — DEXAMETHASONE SODIUM PHOSPHATE 10 MG/ML IJ SOLN
10.0000 mg | Freq: Once | INTRAMUSCULAR | Status: AC
  Administered 2020-08-15: 10 mg via INTRAVENOUS
  Filled 2020-08-15: qty 1

## 2020-08-15 MED ORDER — SODIUM CHLORIDE 0.9 % IV SOLN
1.0000 g | INTRAVENOUS | Status: AC
  Administered 2020-08-16 – 2020-08-19 (×4): 1 g via INTRAVENOUS
  Filled 2020-08-15 (×4): qty 10

## 2020-08-15 MED ORDER — LORAZEPAM 2 MG/ML IJ SOLN
0.5000 mg | Freq: Once | INTRAMUSCULAR | Status: AC
  Administered 2020-08-15: 0.5 mg via INTRAVENOUS
  Filled 2020-08-15: qty 1

## 2020-08-15 MED ORDER — LACTATED RINGERS IV SOLN: INTRAVENOUS | Status: DC

## 2020-08-15 MED ORDER — LEVOFLOXACIN IN D5W 750 MG/150ML IV SOLN
750.0000 mg | Freq: Once | INTRAVENOUS | Status: AC
  Administered 2020-08-15: 750 mg via INTRAVENOUS
  Filled 2020-08-15: qty 150

## 2020-08-15 MED ORDER — GUAIFENESIN-DM 100-10 MG/5ML PO SYRP
10.0000 mL | ORAL_SOLUTION | ORAL | Status: DC | PRN
  Administered 2020-08-16: 10 mL via ORAL
  Filled 2020-08-15: qty 10

## 2020-08-15 MED ORDER — ONDANSETRON HCL 4 MG PO TABS: 4.0000 mg | ORAL_TABLET | Freq: Four times a day (QID) | ORAL | Status: DC | PRN

## 2020-08-15 MED ORDER — POLYETHYLENE GLYCOL 3350 17 G PO PACK: 17.0000 g | PACK | Freq: Every day | ORAL | Status: DC | PRN

## 2020-08-15 MED ORDER — FUROSEMIDE 10 MG/ML IJ SOLN
40.0000 mg | Freq: Two times a day (BID) | INTRAMUSCULAR | Status: DC
  Administered 2020-08-16 – 2020-08-18 (×5): 40 mg via INTRAVENOUS
  Filled 2020-08-15 (×5): qty 4

## 2020-08-15 MED ORDER — SODIUM CHLORIDE 0.9 % IV SOLN
100.0000 mg | INTRAVENOUS | Status: AC
  Administered 2020-08-15 (×2): 100 mg via INTRAVENOUS
  Filled 2020-08-15 (×2): qty 20

## 2020-08-15 MED ORDER — ZINC SULFATE 220 (50 ZN) MG PO CAPS
220.0000 mg | ORAL_CAPSULE | Freq: Every day | ORAL | Status: DC
  Administered 2020-08-16 – 2020-08-18 (×3): 220 mg via ORAL
  Filled 2020-08-15 (×4): qty 1

## 2020-08-15 MED ORDER — ASCORBIC ACID 500 MG PO TABS
500.0000 mg | ORAL_TABLET | Freq: Every day | ORAL | Status: DC
  Administered 2020-08-16 – 2020-08-18 (×3): 500 mg via ORAL
  Filled 2020-08-15 (×4): qty 1

## 2020-08-15 MED ORDER — HEPARIN SODIUM (PORCINE) 5000 UNIT/ML IJ SOLN
5000.0000 [IU] | Freq: Three times a day (TID) | INTRAMUSCULAR | Status: DC
  Administered 2020-08-15 – 2020-08-16 (×3): 5000 [IU] via SUBCUTANEOUS
  Filled 2020-08-15 (×3): qty 1

## 2020-08-15 NOTE — ED Notes (Signed)
Pt son, Legrand Como,  called and inquired about pt status. Legrand Como not listed on chart. Contact information, [phone], taken and given to EDP. Conversing with EDP when pt daughter calls. EDP discussed care plan and pt condition with pt daughter. EDP has daughter's contact information, [phone].

## 2020-08-15 NOTE — ED Notes (Signed)
RT at bedside. About to switch patient heated high flow oxygen.

## 2020-08-15 NOTE — ED Notes (Addendum)
EDP at pt bedside and reported would like to speak to family in family waiting room. Screeners aware and reported would bring pt family back to family waiting room.   Per screener, pt family did not pick up when called. Screener left voicemail with family and is awaiting return phone call. Screener reported would notify nursing if pt family member made contact.

## 2020-08-15 NOTE — H&P (Signed)
History and Physical    RAKAN SOFFER TTS:[id number] DOB: 1936-06-10 DOA: 08/29/2020  PCP: [name], MD   Patient coming from: Home  I have personally briefly reviewed patient's old medical records in [place]  Chief Complaint: Difficulty breathing  HPI: [name] [name] is a 84 y.o. male with medical history significant for CABG, hypertension, ischemic cardiomyopathy, BPH.  Patient presented to the ED today with complaints of difficulty breathing of 5 days duration, associated with cough, chest congestion and sneezing wheezing and diarrhea.  At the time of my evaluation patient is on BiPAP.  He also has bilateral lower extremity swelling which he tells me has been for a while.   Patient was vaccinated for Covid in May.  Patient's wife passed from [place] yesterday 10/6.    Per EMS patient's O2 sats was 67% on nonrebreather.  ED Course: Patient placed on BiPAP with O2 sats initially 77% improved, now up to 95 at the time of my evaluation.  WBC 15.7.  Elevation in most inflammatory markers.  ABG showed pH of 7.28, PCO2 42, PO2 of 43.  Lactic acid 3.2 >> 1.6.  Portable chest x-ray shows hazy infiltrate in the left lung question pneumonia, less likely symmetric edema.  Also pulmonary vascular congestion and atelectasis right base. IV Levaquin was started.  1 L bolus given.  10 mg dexamethasone also given.  Hospitalist to admit for acute respiratory failure with hypoxia according to COVID-19 pneumonia.  Review of Systems: Limited exam patient on BiPAP.  Past Medical History:  Diagnosis Date  . Arthritis   . CHF (congestive heart failure) ([place])   . Chronic constipation   . Coronary atherosclerosis of native coronary artery    a. s/p CABG in 2002 b. low-risk NST in 2013  . Diverticulosis 2007   Colonoscopy  . Essential hypertension   . Fatty liver disease, nonalcoholic   . GERD (gastroesophageal reflux disease)   . Headache(784.0)   . Hiatal hernia   . Ischemic  cardiomyopathy    a. LVEF previously reduced to 35% with normalization since by review of prior notes  . Mixed hyperlipidemia   . Myocardial infarction ([place])   . Nephrolithiasis   . Renal insufficiency   . Schatzki's ring    Last EGD/ED Dr Paul Half    Past Surgical History:  Procedure Laterality Date  . CORONARY ARTERY BYPASS GRAFT  2002   LIMA to LAD, SVG to diagonal and ramus, SVG to PDA and PLA  . HEMORRHOID SURGERY    . KIDNEY STONE SURGERY     right  . KNEE ARTHROSCOPY     left  . NEPHRECTOMY     left  . TOTAL KNEE ARTHROPLASTY  08/01/2012   Procedure: TOTAL KNEE ARTHROPLASTY;  Surgeon: Tobi Bastos, MD;  Location: [facility];  Service: Orthopedics;  Laterality: Left;     reports that he quit smoking about 59 years ago. His smoking use included cigarettes. He started smoking about 69 years ago. He has a 1.00 pack-year smoking history. His smokeless tobacco use includes chew. He reports that he does not drink alcohol and does not use drugs.  Allergies  Allergen Reactions  . Amoxicillin     Other reaction(s): Unknown    Family History  Problem Relation Age of Onset  . Coronary artery disease Father     Prior to Admission medications   Medication Sig Start Date End Date Taking? Authorizing Provider  acetaminophen (TYLENOL) 500 MG tablet Take 1,000 mg by  mouth every 6 (six) hours as needed for mild pain or moderate pain.    [provider]  albuterol (VENTOLIN HFA) 108 (90 Base) MCG/ACT inhaler Inhale 2 puffs into the lungs every 6 (six) hours as needed for wheezing or shortness of breath. 08/14/20   Loman Brooklyn, FNP  amLODipine (NORVASC) 10 MG tablet Take 1 tablet (10 mg total) by mouth at bedtime. 03/06/20   [name], MD  atorvastatin (LIPITOR) 20 MG tablet Take 1 tablet (20 mg total) by mouth daily. 03/06/20   [name], MD  azithromycin (ZITHROMAX Z-PAK) 250 MG tablet Take 2 tablets (500 mg) PO today, then 1 tablet (250 mg) PO daily x4 days.  08/14/20   Loman Brooklyn, FNP  brimonidine (ALPHAGAN) 0.2 % ophthalmic solution INSTILL 1 DROP INTO RIGHT EYE TWICE A DAY 01/13/18   [provider]  carvedilol (COREG) 6.25 MG tablet Take 1 tablet (6.25 mg total) by mouth 2 (two) times daily. 07/10/20   Satira Sark, MD  cholecalciferol (VITAMIN D) 1000 units tablet Take 1,000 Units by mouth daily.    [provider]  clopidogrel (PLAVIX) 75 MG tablet TAKE 1 TABLET EVERY DAY 05/23/20   Satira Sark, MD  dorzolamide (TRUSOPT) 2 % ophthalmic solution Apply to eye. 10/12/14   [provider]  dorzolamide-timolol (COSOPT) 22.3-6.8 MG/ML ophthalmic solution Place 1 drop into both eyes 2 (two) times daily.  05/28/15   [provider]  erythromycin ophthalmic ointment Apply  a small amount  twice a day as directed 08/21/19   [provider]  finasteride (PROSCAR) 5 MG tablet Take 1 tablet (5 mg total) by mouth daily. 03/06/20   [name], MD  latanoprost (XALATAN) 0.005 % ophthalmic solution Place 1 drop into both eyes at bedtime. 06/28/17   [provider]  lisinopril (ZESTRIL) 30 MG tablet TAKE 1 TABLET EVERY DAY 01/31/20   Satira Sark, MD  methylPREDNISolone (MEDROL DOSEPAK) 4 MG TBPK tablet Use as directed. 08/14/20   Loman Brooklyn, FNP  mometasone (ELOCON) 0.1 % cream Apply 1 application topically daily. To affected areas around eyes 09/06/19   [name], MD  nitroGLYCERIN (NITROSTAT) 0.4 MG SL tablet Place 1 tablet (0.4 mg total) under the tongue every 5 (five) minutes as needed for chest pain. 03/06/20   [name], MD  Olopatadine HCl (PATADAY OP) Apply to eye.    [provider]  pantoprazole (PROTONIX) 40 MG tablet Take 1 tablet (40 mg total) by mouth daily. 03/06/20   [name], MD  trimethoprim-polymyxin b (POLYTRIM) ophthalmic solution every 4 (four) hours.    [provider]    Physical Exam: Vitals:   08/11/2020 1445 08/31/2020 1500  08/19/2020 1526 08/21/2020 1530  BP:  (!) 148/69  139/69  Pulse: (!) 59 62  61  Resp: (!) 42 (!) 33  (!) 39  SpO2: (!) 85% 90% (!) 87% 90%  Weight:        Constitutional: On BiPAP Vitals:   08/26/2020 1445 08/13/2020 1500 08/25/2020 1526 08/31/2020 1530  BP:  (!) 148/69  139/69  Pulse: (!) 59 62  61  Resp: (!) 42 (!) 33  (!) 39  SpO2: (!) 85% 90% (!) 87% 90%  Weight:       Eyes: PERRL, lids and conjunctivae normal ENMT: On BiPAP Neck: normal, supple, no masses, no thyromegaly Respiratory: On BiPAP, at this time appears calm and comfortable. Cardiovascular: Regular rate and rhythm, no murmurs / rubs /  gallops.  1+ pitting extremity edema to upper legs bilaterally.   2+ pedal pulses. .  Abdomen: no tenderness, no masses palpated. No hepatosplenomegaly. Bowel sounds positive.  Musculoskeletal: no clubbing / cyanosis. No joint deformity upper and lower extremities. Good ROM, no contractures. Normal muscle tone.  Skin: no rashes, lesions, ulcers. No induration Neurologic: No apparent cranial abnormality, moving extremities spontaneously. Psychiatric: Normal judgment and insight. Alert and oriented x 3. Normal mood.   Labs on Admission: I have personally reviewed following labs and imaging studies  CBC: Recent Labs  Lab 09/06/2020 1318 08/21/2020 1541  WBC 15.7* 13.5*  NEUTROABS 14.6* 12.7*  HGB 13.2 13.0  HCT 40.5 38.9*  MCV 81.7 80.4  PLT 169 557   Basic Metabolic Panel: Recent Labs  Lab 08/21/2020 1318  NA 131*  K 4.0  CL 101  CO2 18*  GLUCOSE 234*  BUN 30*  CREATININE 2.11*  CALCIUM 8.1*   Liver Function Tests: Recent Labs  Lab 08/17/2020 1318  AST 31  ALT 17  ALKPHOS 71  BILITOT 1.4*  PROT 6.1*  ALBUMIN 2.5*   Coagulation Profile: Recent Labs  Lab 08/14/2020 1318  INR 1.3*   Lipid Profile: Recent Labs    08/27/2020 1541  TRIG 134   Anemia Panel: Recent Labs    08/13/2020 1541  FERRITIN 867*   Urine analysis:    Component Value Date/Time   COLORURINE AMBER  (A) 09/02/2020 1318   APPEARANCEUR CLOUDY (A) 08/09/2020 1318   LABSPEC 1.020 08/30/2020 1318   PHURINE 5.0 08/31/2020 1318   GLUCOSEU NEGATIVE 08/11/2020 1318   HGBUR LARGE (A) 08/13/2020 1318   BILIRUBINUR NEGATIVE 08/19/2020 1318   KETONESUR NEGATIVE 08/19/2020 1318   PROTEINUR 30 (A) 09/07/2020 1318   UROBILINOGEN 0.2 07/27/2014 0810   NITRITE NEGATIVE 08/21/2020 1318   LEUKOCYTESUR LARGE (A) 09/05/2020 1318    Radiological Exams on Admission: DG Chest Port 1 View  Result Date: 08/22/2020 CLINICAL DATA:  Shortness of breath, oxygen desaturation EXAM: PORTABLE CHEST 1 VIEW COMPARISON:  Portable exam 1320 hours without priors for comparison FINDINGS: Lordotic positioning limits exam. Enlargement of cardiac silhouette with slight vascular congestion post CABG. Mediastinal contours normal. Subsegmental atelectasis RIGHT base. Hazy infiltrates in LEFT lung which could represent infection or less likely asymmetric edema. New no pleural effusion or pneumothorax. Bones demineralized. IMPRESSION: Hazy infiltrates in LEFT lung question pneumonia less likely asymmetric edema. Enlargement of cardiac silhouette with pulmonary vascular congestion. Atelectasis at RIGHT base. Electronically Signed   By: Lavonia Dana M.D.   On: 08/19/2020 13:54    EKG: Independently reviewed.  Sinus rhythm, QTC 501.  Old left bundle branch block.  T wave abnormalities inferior lateral leads, but this is unchanged from prior.  Assessment/Plan Principal Problem:   Pneumonia due to COVID-19 virus Active Problems:   Essential hypertension, benign   Acute renal failure superimposed on stage 3 chronic kidney disease (HCC)   CHF (congestive heart failure) (HCC)   Acute respiratory failure with hypoxemia (HCC)   Protein calorie malnutrition (HCC)   Acute respiratory failure with hypoxia (HCC)  Acute hypoxic respiratory failure-O2 sats were down to 6 7% on nonrebreather, is currently on BiPAP, O2 sats have improved  currently mid 90s.  D-dimer not significantly elevated at 0.64.  Likely due to combination of Covid pneumonia and decompensated CHF.  EDP consulted critical care, okay to admit here, -Continue BiPAP for now -Monitor closely, high risk for intubation. -Remain n.p.o. for now  Pneumonia due to COVID-19-portable chest  x-ray shows hazy infiltrates in left lung question pneumonia less likely asymmetric edema.  Elevation in some inflammatory markers, LDH, fibrinogen, CRP, ferritin. -Remdesivir pharmacy to dose -Dexamethasone 10 mg x 1 continue 6 mg daily -COVID-19 admission protocol - Mucolytic's, bronchodilators, incentive spirometry, flutter valve. -Multivitamins -Trend inflammatory markers -Follow-up urine strep and Legionella antigen -Leukocytosis of 15, procalcitonin elevation of 1.86, will continue CAP coverage with IV ceftriaxone and doxycycline.  Decompensated systolic CHF- markedly elevated at 1029.  Chest x-ray shows pulmonary vascular congestion, 1+ pitting extremity edema.  In the past EF has been as low as 35%, but as per notes his EF normalized.  Per Lexiscan 2019 EF of 48%. -IV Lasix 40 mg twice daily -Updated echocardiogram -Strict input output, daily weights -Daily BMP  AKI on CKD3b- creatinine 2.1, baseline 1.3-1.4.  Home medication list has lisinopril. -1 L bolus given in ED, but it appears he would benefit from diuresis, monitor creatinine closely   Elevated troponin- 327.  EKG shows old left bundle branch block and old inferior lateral T wave abnormalities. -Trend troponin. -Echocardiogram  Hypertension-stable.  Hold Norvasc, lisinopril, carvedilol while n.p.o. -IV Lasix -IV hydralazine 10mg  as needed -N.p.o. for now  Prolonged QTC-501. -Magnesium normal 1.9   DVT prophylaxis: Lovenox Code Status: Full code Family Communication: PCCM talked to family.  Patient's daughter is a respiratory therapist .  She is power of attorney.  Per notes patient's daughter agreed  to short-term life support if needed. Disposition Plan: > 2 days Consults called: Critical care. Admission status: Inpatient, stepdown. I certify that at the point of admission it is my clinical judgment that the patient will require inpatient hospital care spanning beyond 2 midnights from the point of admission due to high intensity of service, high risk for further deterioration and high frequency of surveillance required.    Bethena Roys MD [facility]  08/25/2020, 5:26 PM

## 2020-08-15 NOTE — Progress Notes (Signed)
[facility] notified of increasing troponin. No c/o chest pain.  Patient is short of breath with diagnosis of COVID PNA and BNP > 1000. No new orders at this time.

## 2020-08-15 NOTE — Plan of Care (Signed)
  Problem: Education: Goal: Knowledge of General Education information will improve Description: Including pain rating scale, medication(s)/side effects and non-pharmacologic comfort measures Outcome: Not Progressing   Problem: Health Behavior/Discharge Planning: Goal: Ability to manage health-related needs will improve Outcome: Not Progressing   Problem: Clinical Measurements: Goal: Ability to maintain clinical measurements within normal limits will improve Outcome: Not Progressing   Problem: Clinical Measurements: Goal: Will remain free from infection Outcome: Not Progressing

## 2020-08-15 NOTE — ED Notes (Signed)
Date and time results received: 08/30/2020 1410   Test: Lactic Acid Critical Value: 3.2  Name of Provider Notified: Dr. Roderic Palau  Orders Received? Or Actions Taken?: No new orders given.

## 2020-08-15 NOTE — ED Notes (Signed)
Pt arrived via EMS, pt family in waiting room. Pt family updated on care plan and that no visitors are allowed at this time due to suspicion of covid related symptoms. Pt family verbalized understanding and aware will continue to be updated.

## 2020-08-15 NOTE — ED Notes (Signed)
Date and time results received: 08/29/2020 1644   Test: Troponin Critical Value: 327  Name of Provider Notified: Dr. Melvyn Novas  Orders Received? Or Actions Taken?: No new orders given.

## 2020-08-15 NOTE — Consult Note (Addendum)
NAME:  [name] [name], MRN:  [id number], DOB:  10/06/1936, LOS: 0 ADMISSION DATE:  09/01/2020, CONSULTATION DATE:  10/7 REFERRING MD:  [place]/ EDP, CHIEF COMPLAINT:  sob   Brief History   83 yowm with progressive sob with bilateral infiltrates and POS covid testing and PCCM consulted pm 10/7 for ? Need for life support   History of present illness   10/6 televisit with pcp:  Patient complains of cough, chest congestion, sneezing, shortness of breath, wheezing and diarrhea. Onset of symptoms was last week in September,  unchanged since that time. rx zpak/ medrol dose pack   Wife passed from covid 10/6 and both tested on 9/30 and he was neg and vacinated but progressive sob > ER with bilateral infiltrates and placed on bipap and PCCM consulted.  No additional hx avail from pt.  Daughter is RT at [employer] and he was initially NCB but she is POA and he agreed to short term life support if needed.    Past Medical History  CHF EF 35%  HBP CRI  Significant Hospital Events      Consults:  PCCM  Procedures:    Significant Diagnostic Tests:    Micro Data:  covid 19 PCR  10/7  Pos BC x 2  10/7   >>> UC  10/7 >>> Urine pneum 10/7 >>> Urine strep  10/7 >>>  Antimicrobials:  zpak  10/6 Levaquin 10/7    Scheduled Meds: Continuous Infusions: . lactated ringers Stopped (08/12/2020 1418)   PRN Meds:.  Interim history/subjective:  Looks more comfortable on bipap but sats still in mid 90s on 100%  Objective   Blood pressure 139/69, pulse 61, resp. rate (!) 39, weight 86.2 kg, SpO2 90 %.    Vent Mode: BIPAP FiO2 (%):  [100 %] 100 % Set Rate:  [22 bmp] 22 bmp PEEP:  [8 cmH20] 8 cmH20   Intake/Output Summary (Last 24 hours) at 08/10/2020 1635 Last data filed at 08/24/2020 1418 Gross per 24 hour  Intake 451.61 ml  Output --  Net 451.61 ml   Filed Weights   09/07/2020 1342  Weight: 86.2 kg    Examination: Tmax 97 General: chronically and acutely ill appearing sitting up 45  degrees on strecher HENT: bipap mask on  Lungs: distant bs / few crackles both bases  Cardiovascular: RRR NSR  No s3  Abdomen: soft, benign Extremities: warm trace edema  Neuro: moves all 4 to req GU:  Foley in place    I personally reviewed images and agree with radiology impression as follows:  CXR:  10/7   Portable Hazy infiltrates in LEFT lung question pneumonia less likely asymmetric edema.  Enlargement of cardiac silhouette with pulmonary vascular congestion.  Atelectasis at RIGHT base.   Resolved Hospital Problem list      Assessment & Plan:  1)  Acute hypoxemic resp failure in pt with covid 19 infection s/p vaccination ? May 2021  - degree of hypoxemia typical of covid pna - can't r/o superimposed bact infection esp staph - also can't r/o chf with CM on cxr and bnp over 1000  >>>  Continue bipap prn/ diuresis/ abx/ steroids and check serial PCT   2) CHF/ EF 35%  R/o MI with serial troponins  3) acute on chronic Renal insuff on ACEi PTA Lab Results  Component Value Date   CREATININE 2.11 (H) 08/25/2020   CREATININE 1.46 (H) 03/06/2020   CREATININE 1.39 (H) 09/06/2019   >> foley, avoid toxins/ hypotension/ no  more acei  4)  HBP  - hold all meds for now, exp acei  5) Low albumin on admit c/w protein calorie malnutrition and a poor prognostic sign   Best practice:  Diet: npo Pain/Anxiety/Delirium protocol (if indicated): per [facility] VAP protocol (if indicated):  DVT prophylaxis: Heparin sq GI prophylaxis: PPI Glucose control: per [facility] Mobility: sbr  For nwo Code Status: full code for now Family Communication: spoke to daughter [name], wants all reasonable care that will restore previous qol, not prolonged vent Disposition: ICU  Labs   CBC: Recent Labs  Lab 08/24/2020 1318 08/11/2020 1541  WBC 15.7* 13.5*  NEUTROABS 14.6* 12.7*  HGB 13.2 13.0  HCT 40.5 38.9*  MCV 81.7 80.4  PLT 169 637    Basic Metabolic Panel: Recent Labs  Lab 08/17/2020 1318   NA 131*  K 4.0  CL 101  CO2 18*  GLUCOSE 234*  BUN 30*  CREATININE 2.11*  CALCIUM 8.1*   GFR: Estimated Creatinine Clearance: 27.7 mL/min (A) (by C-G formula based on SCr of 2.11 mg/dL (H)). Recent Labs  Lab 08/17/2020 1318 08/17/2020 1410 08/10/2020 1541  WBC 15.7*  --  13.5*  LATICACIDVEN  --  3.2* 1.6    Liver Function Tests: Recent Labs  Lab 08/22/2020 1318  AST 31  ALT 17  ALKPHOS 71  BILITOT 1.4*  PROT 6.1*  ALBUMIN 2.5*   No results for input(s): LIPASE, AMYLASE in the last 168 hours. No results for input(s): AMMONIA in the last 168 hours.  ABG    Component Value Date/Time   PHART 7.287 (L) 08/19/2020 1342   PCO2ART 40.6 08/25/2020 1342   PO2ART 43.1 (L) 08/16/2020 1342   HCO3 18.3 (L) 08/22/2020 1342   TCO2 27 07/06/2014 1412   ACIDBASEDEF 6.6 (H) 08/16/2020 1342   O2SAT 68.0 09/01/2020 1342     Coagulation Profile: Recent Labs  Lab 08/27/2020 1318  INR 1.3*    Cardiac Enzymes: No results for input(s): CKTOTAL, CKMB, CKMBINDEX, TROPONINI in the last 168 hours.  HbA1C: No results found for: HGBA1C  CBG: No results for input(s): GLUCAP in the last 168 hours.     Past Medical History  He,  has a past medical history of Arthritis, CHF (congestive heart failure) ([place]), Chronic constipation, Coronary atherosclerosis of native coronary artery, Diverticulosis (2007), Essential hypertension, Fatty liver disease, nonalcoholic, GERD (gastroesophageal reflux disease), Headache(784.0), Hiatal hernia, Ischemic cardiomyopathy, Mixed hyperlipidemia, Myocardial infarction ([place]), Nephrolithiasis, Renal insufficiency, and Schatzki's ring.   Surgical History    Past Surgical History:  Procedure Laterality Date  . CORONARY ARTERY BYPASS GRAFT  2002   LIMA to LAD, SVG to diagonal and ramus, SVG to PDA and PLA  . HEMORRHOID SURGERY    . KIDNEY STONE SURGERY     right  . KNEE ARTHROSCOPY     left  . NEPHRECTOMY     left  . TOTAL KNEE ARTHROPLASTY  08/01/2012    Procedure: TOTAL KNEE ARTHROPLASTY;  Surgeon: Tobi Bastos, MD;  Location: [facility];  Service: Orthopedics;  Laterality: Left;     Social History   reports that he quit smoking about 59 years ago. His smoking use included cigarettes. He started smoking about 69 years ago. He has a 1.00 pack-year smoking history. His smokeless tobacco use includes chew. He reports that he does not drink alcohol and does not use drugs.   Family History   His family history includes Coronary artery disease in his father.   Allergies Allergies  Allergen Reactions  . Amoxicillin     Other reaction(s): Unknown     Home Medications  Prior to Admission medications   Medication Sig Start Date End Date Taking? Authorizing Provider  acetaminophen (TYLENOL) 500 MG tablet Take 1,000 mg by mouth every 6 (six) hours as needed for mild pain or moderate pain.    [provider]  albuterol (VENTOLIN HFA) 108 (90 Base) MCG/ACT inhaler Inhale 2 puffs into the lungs every 6 (six) hours as needed for wheezing or shortness of breath. 08/14/20   Loman Brooklyn, FNP  amLODipine (NORVASC) 10 MG tablet Take 1 tablet (10 mg total) by mouth at bedtime. 03/06/20   [name], MD  atorvastatin (LIPITOR) 20 MG tablet Take 1 tablet (20 mg total) by mouth daily. 03/06/20   [name], MD  azithromycin (ZITHROMAX Z-PAK) 250 MG tablet Take 2 tablets (500 mg) PO today, then 1 tablet (250 mg) PO daily x4 days. 08/14/20   Loman Brooklyn, FNP  brimonidine (ALPHAGAN) 0.2 % ophthalmic solution INSTILL 1 DROP INTO RIGHT EYE TWICE A DAY 01/13/18   [provider]  carvedilol (COREG) 6.25 MG tablet Take 1 tablet (6.25 mg total) by mouth 2 (two) times daily. 07/10/20   Satira Sark, MD  cholecalciferol (VITAMIN D) 1000 units tablet Take 1,000 Units by mouth daily.    [provider]  clopidogrel (PLAVIX) 75 MG tablet TAKE 1 TABLET EVERY DAY 05/23/20   Satira Sark, MD  dorzolamide (TRUSOPT) 2 % ophthalmic  solution Apply to eye. 10/12/14   [provider]  dorzolamide-timolol (COSOPT) 22.3-6.8 MG/ML ophthalmic solution Place 1 drop into both eyes 2 (two) times daily.  05/28/15   [provider]  erythromycin ophthalmic ointment Apply  a small amount  twice a day as directed 08/21/19   [provider]  finasteride (PROSCAR) 5 MG tablet Take 1 tablet (5 mg total) by mouth daily. 03/06/20   [name], MD  latanoprost (XALATAN) 0.005 % ophthalmic solution Place 1 drop into both eyes at bedtime. 06/28/17   [provider]  lisinopril (ZESTRIL) 30 MG tablet TAKE 1 TABLET EVERY DAY 01/31/20   Satira Sark, MD  methylPREDNISolone (MEDROL DOSEPAK) 4 MG TBPK tablet Use as directed. 08/14/20   Loman Brooklyn, FNP  mometasone (ELOCON) 0.1 % cream Apply 1 application topically daily. To affected areas around eyes 09/06/19   [name], MD  nitroGLYCERIN (NITROSTAT) 0.4 MG SL tablet Place 1 tablet (0.4 mg total) under the tongue every 5 (five) minutes as needed for chest pain. 03/06/20   [name], MD  Olopatadine HCl (PATADAY OP) Apply to eye.    [provider]  pantoprazole (PROTONIX) 40 MG tablet Take 1 tablet (40 mg total) by mouth daily. 03/06/20   [name], MD  trimethoprim-polymyxin b (POLYTRIM) ophthalmic solution every 4 (four) hours.    [provider]       The patient is critically ill with multiple organ systems failure and requires high complexity decision making for assessment and support, frequent evaluation and titration of therapies, application of advanced monitoring technologies and extensive interpretation of multiple databases. Critical Care Time devoted to patient care services described in this note is 45 minutes.    Christinia Gully, MD Pulmonary and [place] [phone]   After 7:00 pm call Elink  [phone]

## 2020-08-15 NOTE — Progress Notes (Signed)
eLink Physician-Brief Progress Note Patient Name: [name] [name] DOB: 1936-04-12 MRN: [id number]   Date of Service  08/26/2020  HPI/Events of Note  Patient admitted with Covid 19 pneumonia and acute hypoxemic respiratory failure despite being vaccinated, he also has a history of CHF.  eICU Interventions  New Patient Evaluation completed. Patient is on BIPAP.        [name] 08/16/2020, 8:33 PM

## 2020-08-15 NOTE — ED Provider Notes (Signed)
[facility] EMERGENCY DEPARTMENT Provider Note   CSN: [id number] Arrival date & time: 09/08/2020  1258     History Chief Complaint  Patient presents with  . Shortness of Breath    [name] [name] is a 84 y.o. male.  Patient has been sick with a cough and shortness of breath for a week.  His wife just died of Covid.  He did get his vaccine.  The history is provided by the patient and medical records. No language interpreter was used.  Shortness of Breath Severity:  Moderate Onset quality:  Sudden Timing:  Constant Progression:  Worsening Chronicity:  New Relieved by:  Nothing Worsened by:  Nothing Ineffective treatments:  None tried Associated symptoms: cough   Associated symptoms: no abdominal pain, no chest pain, no headaches and no rash        Past Medical History:  Diagnosis Date  . Arthritis   . CHF (congestive heart failure) ([place])   . Chronic constipation   . Coronary atherosclerosis of native coronary artery    a. s/p CABG in 2002 b. low-risk NST in 2013  . Diverticulosis 2007   Colonoscopy  . Essential hypertension   . Fatty liver disease, nonalcoholic   . GERD (gastroesophageal reflux disease)   . Headache(784.0)   . Hiatal hernia   . Ischemic cardiomyopathy    a. LVEF previously reduced to 35% with normalization since by review of prior notes  . Mixed hyperlipidemia   . Myocardial infarction ([place])   . Nephrolithiasis   . Renal insufficiency   . Schatzki's ring    Last EGD/ED Dr Paul Half    Patient Active Problem List   Diagnosis Date Noted  . Tinea corporis 05/10/2018  . Benign prostatic hyperplasia without lower urinary tract symptoms 05/05/2016  . CKD (chronic kidney disease) stage 2, GFR 60-89 ml/min 05/05/2016  . Dyslipidemia 05/05/2016  . Vitamin D deficiency 03/05/2014  . S/P total knee arthroplasty 08/02/2012  . Essential hypertension, benign 06/04/2010  . CORONARY ATHEROSCLEROSIS NATIVE CORONARY ARTERY 06/04/2010  . CARDIOMYOPATHY,  ISCHEMIC 07/26/2009  . HYPERCHOLESTEROLEMIA 02/18/2009  . TOBACCO USER 02/18/2009  . Esophageal reflux 02/18/2009    Past Surgical History:  Procedure Laterality Date  . CORONARY ARTERY BYPASS GRAFT  2002   LIMA to LAD, SVG to diagonal and ramus, SVG to PDA and PLA  . HEMORRHOID SURGERY    . KIDNEY STONE SURGERY     right  . KNEE ARTHROSCOPY     left  . NEPHRECTOMY     left  . TOTAL KNEE ARTHROPLASTY  08/01/2012   Procedure: TOTAL KNEE ARTHROPLASTY;  Surgeon: Tobi Bastos, MD;  Location: [facility];  Service: Orthopedics;  Laterality: Left;       Family History  Problem Relation Age of Onset  . Coronary artery disease Father     Social History   Tobacco Use  . Smoking status: Former Smoker    Packs/day: 0.50    Years: 2.00    Pack years: 1.00    Types: Cigarettes    Start date: 01/07/1951    Quit date: 05/20/1961    Years since quitting: 59.2  . Smokeless tobacco: Current User    Types: Chew  . Tobacco comment: quit 50 yrs ago  Vaping Use  . Vaping Use: Never used  Substance Use Topics  . Alcohol use: No    Alcohol/week: 0.0 standard drinks  . Drug use: No    Home Medications Prior to Admission medications   Medication  Sig Start Date End Date Taking? Authorizing Provider  acetaminophen (TYLENOL) 500 MG tablet Take 1,000 mg by mouth every 6 (six) hours as needed for mild pain or moderate pain.    [provider]  albuterol (VENTOLIN HFA) 108 (90 Base) MCG/ACT inhaler Inhale 2 puffs into the lungs every 6 (six) hours as needed for wheezing or shortness of breath. 08/14/20   Loman Brooklyn, FNP  amLODipine (NORVASC) 10 MG tablet Take 1 tablet (10 mg total) by mouth at bedtime. 03/06/20   [name], MD  atorvastatin (LIPITOR) 20 MG tablet Take 1 tablet (20 mg total) by mouth daily. 03/06/20   [name], MD  azithromycin (ZITHROMAX Z-PAK) 250 MG tablet Take 2 tablets (500 mg) PO today, then 1 tablet (250 mg) PO daily x4 days. 08/14/20   Loman Brooklyn, FNP  brimonidine (ALPHAGAN) 0.2 % ophthalmic solution INSTILL 1 DROP INTO RIGHT EYE TWICE A DAY 01/13/18   [provider]  carvedilol (COREG) 6.25 MG tablet Take 1 tablet (6.25 mg total) by mouth 2 (two) times daily. 07/10/20   Satira Sark, MD  cholecalciferol (VITAMIN D) 1000 units tablet Take 1,000 Units by mouth daily.    [provider]  clopidogrel (PLAVIX) 75 MG tablet TAKE 1 TABLET EVERY DAY 05/23/20   Satira Sark, MD  dorzolamide (TRUSOPT) 2 % ophthalmic solution Apply to eye. 10/12/14   [provider]  dorzolamide-timolol (COSOPT) 22.3-6.8 MG/ML ophthalmic solution Place 1 drop into both eyes 2 (two) times daily.  05/28/15   [provider]  erythromycin ophthalmic ointment Apply  a small amount  twice a day as directed 08/21/19   [provider]  finasteride (PROSCAR) 5 MG tablet Take 1 tablet (5 mg total) by mouth daily. 03/06/20   [name], MD  latanoprost (XALATAN) 0.005 % ophthalmic solution Place 1 drop into both eyes at bedtime. 06/28/17   [provider]  lisinopril (ZESTRIL) 30 MG tablet TAKE 1 TABLET EVERY DAY 01/31/20   Satira Sark, MD  methylPREDNISolone (MEDROL DOSEPAK) 4 MG TBPK tablet Use as directed. 08/14/20   Loman Brooklyn, FNP  mometasone (ELOCON) 0.1 % cream Apply 1 application topically daily. To affected areas around eyes 09/06/19   [name], MD  nitroGLYCERIN (NITROSTAT) 0.4 MG SL tablet Place 1 tablet (0.4 mg total) under the tongue every 5 (five) minutes as needed for chest pain. 03/06/20   [name], MD  Olopatadine HCl (PATADAY OP) Apply to eye.    [provider]  pantoprazole (PROTONIX) 40 MG tablet Take 1 tablet (40 mg total) by mouth daily. 03/06/20   [name], MD  trimethoprim-polymyxin b (POLYTRIM) ophthalmic solution every 4 (four) hours.    [provider]    Allergies    Amoxicillin  Review of Systems   Review of Systems  Constitutional:  Negative for appetite change and fatigue.  HENT: Negative for congestion, ear discharge and sinus pressure.   Eyes: Negative for discharge.  Respiratory: Positive for cough and shortness of breath.   Cardiovascular: Negative for chest pain.  Gastrointestinal: Negative for abdominal pain and diarrhea.  Genitourinary: Negative for frequency and hematuria.  Musculoskeletal: Negative for back pain.  Skin: Negative for rash.  Neurological: Negative for seizures and headaches.  Psychiatric/Behavioral: Negative for hallucinations.    Physical Exam Updated Vital Signs BP (!) 148/69   Pulse 62   Resp (!) 33   Wt 86.2 kg   SpO2 90%   BMI  31.62 kg/m   Physical Exam Vitals and nursing note reviewed.  Constitutional:      Appearance: He is well-developed.  HENT:     Head: Normocephalic.     Nose: Nose normal.  Eyes:     General: No scleral icterus.    Conjunctiva/sclera: Conjunctivae normal.  Neck:     Thyroid: No thyromegaly.  Cardiovascular:     Rate and Rhythm: Normal rate and regular rhythm.     Heart sounds: No murmur heard.  No friction rub. No gallop.   Pulmonary:     Breath sounds: No stridor. Wheezing present. No rales.  Chest:     Chest wall: No tenderness.  Abdominal:     General: There is no distension.     Tenderness: There is no abdominal tenderness. There is no rebound.  Musculoskeletal:        General: Normal range of motion.     Cervical back: Neck supple.  Lymphadenopathy:     Cervical: No cervical adenopathy.  Skin:    Findings: No erythema or rash.  Neurological:     Mental Status: He is alert and oriented to person, place, and time.     Motor: No abnormal muscle tone.     Coordination: Coordination normal.  Psychiatric:        Behavior: Behavior normal.     ED Results / Procedures / Treatments   Labs (all labs ordered are listed, but only abnormal results are displayed) Labs Reviewed  RESP PANEL BY RT PCR (RSV, FLU A&B, COVID) - Abnormal;  Notable for the following components:      Result Value   SARS Coronavirus 2 by RT PCR POSITIVE (*)    All other components within normal limits  LACTIC ACID, PLASMA - Abnormal; Notable for the following components:   Lactic Acid, Venous 3.2 (*)    All other components within normal limits  COMPREHENSIVE METABOLIC PANEL - Abnormal; Notable for the following components:   Sodium 131 (*)    CO2 18 (*)    Glucose, Bld 234 (*)    BUN 30 (*)    Creatinine, Ser 2.11 (*)    Calcium 8.1 (*)    Total Protein 6.1 (*)    Albumin 2.5 (*)    Total Bilirubin 1.4 (*)    GFR calc non Af Amer 28 (*)    All other components within normal limits  CBC WITH DIFFERENTIAL/PLATELET - Abnormal; Notable for the following components:   WBC 15.7 (*)    Neutro Abs 14.6 (*)    Lymphs Abs 0.5 (*)    Abs Immature Granulocytes 0.09 (*)    All other components within normal limits  PROTIME-INR - Abnormal; Notable for the following components:   Prothrombin Time 15.7 (*)    INR 1.3 (*)    All other components within normal limits  BLOOD GAS, ARTERIAL - Abnormal; Notable for the following components:   pH, Arterial 7.287 (*)    pO2, Arterial 43.1 (*)    Bicarbonate 18.3 (*)    Acid-base deficit 6.6 (*)    All other components within normal limits  CULTURE, BLOOD (ROUTINE X 2)  CULTURE, BLOOD (ROUTINE X 2)  URINE CULTURE  APTT  LACTIC ACID, PLASMA  URINALYSIS, ROUTINE W REFLEX MICROSCOPIC  CBC WITH DIFFERENTIAL/PLATELET  D-DIMER, QUANTITATIVE (NOT AT [facility])  PROCALCITONIN  LACTATE DEHYDROGENASE  FERRITIN  TRIGLYCERIDES  FIBRINOGEN  C-REACTIVE PROTEIN  BRAIN NATRIURETIC PEPTIDE  TROPONIN I (HIGH SENSITIVITY)    EKG  None  Radiology DG Chest Port 1 View  Result Date: 08/27/2020 CLINICAL DATA:  Shortness of breath, oxygen desaturation EXAM: PORTABLE CHEST 1 VIEW COMPARISON:  Portable exam 1320 hours without priors for comparison FINDINGS: Lordotic positioning limits exam. Enlargement of cardiac  silhouette with slight vascular congestion post CABG. Mediastinal contours normal. Subsegmental atelectasis RIGHT base. Hazy infiltrates in LEFT lung which could represent infection or less likely asymmetric edema. New no pleural effusion or pneumothorax. Bones demineralized. IMPRESSION: Hazy infiltrates in LEFT lung question pneumonia less likely asymmetric edema. Enlargement of cardiac silhouette with pulmonary vascular congestion. Atelectasis at RIGHT base. Electronically Signed   By: Lavonia Dana M.D.   On: 08/09/2020 13:54    Procedures Procedures (including critical care time)  Medications Ordered in ED Medications  lactated ringers infusion ( Intravenous Stopped 09/08/2020 1418)  levofloxacin (LEVAQUIN) IVPB 750 mg (0 mg Intravenous Stopped 09/03/2020 1418)  sodium chloride 0.9 % bolus 1,000 mL (0 mLs Intravenous Stopped 08/25/2020 1418)  dexamethasone (DECADRON) injection 10 mg (10 mg Intravenous Given 08/10/2020 1344)    ED Course  I have reviewed the triage vital signs and the nursing notes.  Pertinent labs & imaging results that were available during my care of the patient were reviewed by me and considered in my medical decision making (see chart for details). CRITICAL CARE Performed by: Milton Ferguson Total critical care time:105 minutes Critical care time was exclusive of separately billable procedures and treating other patients. Critical care was necessary to treat or prevent imminent or life-threatening deterioration. Critical care was time spent personally by me on the following activities: development of treatment plan with patient and/or surrogate as well as nursing, discussions with consultants, evaluation of patient's response to treatment, examination of patient, obtaining history from patient or surrogate, ordering and performing treatments and interventions, ordering and review of laboratory studies, ordering and review of radiographic studies, pulse oximetry and re-evaluation of  patient's condition.    MDM Rules/Calculators/A&P                          Patient with severe respiratory failure.  He is on 100% oxygen on BiPAP and his O2 sats are 88%.  He is positive for Covid.  He will be seen by the critical care specialist Dr. Melvyn Novas and it will be decided where he should be admitted.     Critical care saw the patient and the patient will be admitted to [facility].      This patient presents to the ED for concern of shortness of breath, this involves an extensive number of treatment options, and is a complaint that carries with it a high risk of complications and morbidity.  The differential diagnosis includes bacterial or viral pneumonia.  Possible congestive heart failure   Lab Tests:   I Ordered, reviewed, and interpreted labs, which included CBC chemistries troponin.  Patient has elevated white count positive Covid test  Medicines ordered:   I ordered medication antibiotics for pneumonia  Imaging Studies ordered:   I ordered imaging studies which included chest x-ray  I independently visualized and interpreted imaging which showed pneumonia  Additional history obtained:   Additional history obtained from daughter  Previous records obtained and reviewed.  Consultations Obtained:   I consulted critical care and hospitalist and discussed lab and imaging findings  Reevaluation:  After the interventions stated above, I reevaluated the patient and found mild improvement  Critical Interventions:  .   Final  Clinical Impression(s) / ED Diagnoses Final diagnoses:  None    Rx / DC Orders ED Discharge Orders    None       Milton Ferguson, MD 08/16/20 [phone]

## 2020-08-15 NOTE — Progress Notes (Signed)
Patient was continually messing with mask.  Bipap alarm was going off about excessive leak.  Will change mask to medium if patient has to go back on Bipap due to excessive leak.  Placed patient on NRB and HHFNC at 30L 100% FiO2.  Patient sat at this time is going between 91 to 93%.  Will continue to monitor.

## 2020-08-15 NOTE — Progress Notes (Signed)
A consult was received from an ED physician for [place] per pharmacy dosing.  The patient's profile has been reviewed for ht/wt/allergies/indication/available labs.   A one time order was given for levaquin 750mg  IV.  After investigating amoxicillin allergy, patient can take cephalosporins and has tolerated cefazolin and ceftriaxone. . Recommend continue tx with ceftriaxone and azithromycin for CAP   Further antibiotics/pharmacy consults should be ordered by admitting physician if indicated.                       Thank you, Cristy Friedlander 08/09/2020  2:04 PM

## 2020-08-15 NOTE — ED Notes (Signed)
Pt family waiting in [place] in ED parking lot awaiting Dr. Melvyn Novas to assess pt. Dr. Melvyn Novas given daughter's contact information.

## 2020-08-15 NOTE — ED Triage Notes (Signed)
Pt has been SOB for the last 5 days. O2 sats with EMS 67% on nonrebreather. O2 sats here is 62% on nonrebreather. RT at bedside. Dr. Roderic Palau notified.

## 2020-08-16 ENCOUNTER — Inpatient Hospital Stay (HOSPITAL_COMMUNITY): Payer: Medicare HMO

## 2020-08-16 ENCOUNTER — Encounter (HOSPITAL_COMMUNITY): Payer: Self-pay | Admitting: Internal Medicine

## 2020-08-16 DIAGNOSIS — J9601 Acute respiratory failure with hypoxia: Secondary | ICD-10-CM | POA: Diagnosis not present

## 2020-08-16 DIAGNOSIS — R9431 Abnormal electrocardiogram [ECG] [EKG]: Secondary | ICD-10-CM

## 2020-08-16 DIAGNOSIS — J1282 Pneumonia due to Coronavirus disease 2019: Secondary | ICD-10-CM | POA: Diagnosis not present

## 2020-08-16 DIAGNOSIS — U071 COVID-19: Secondary | ICD-10-CM | POA: Diagnosis not present

## 2020-08-16 DIAGNOSIS — I214 Non-ST elevation (NSTEMI) myocardial infarction: Secondary | ICD-10-CM | POA: Diagnosis present

## 2020-08-16 DIAGNOSIS — N179 Acute kidney failure, unspecified: Secondary | ICD-10-CM | POA: Diagnosis not present

## 2020-08-16 LAB — CBC WITH DIFFERENTIAL/PLATELET
Abs Immature Granulocytes: 0.06 10*3/uL (ref 0.00–0.07)
Basophils Absolute: 0 10*3/uL (ref 0.0–0.1)
Basophils Relative: 0 %
Eosinophils Absolute: 0 10*3/uL (ref 0.0–0.5)
Eosinophils Relative: 0 %
HCT: 37.7 % — ABNORMAL LOW (ref 39.0–52.0)
Hemoglobin: 12.8 g/dL — ABNORMAL LOW (ref 13.0–17.0)
Immature Granulocytes: 1 %
Lymphocytes Relative: 3 %
Lymphs Abs: 0.3 10*3/uL — ABNORMAL LOW (ref 0.7–4.0)
MCH: 26.8 pg (ref 26.0–34.0)
MCHC: 34 g/dL (ref 30.0–36.0)
MCV: 79 fL — ABNORMAL LOW (ref 80.0–100.0)
Monocytes Absolute: 0.4 10*3/uL (ref 0.1–1.0)
Monocytes Relative: 4 %
Neutro Abs: 9.6 10*3/uL — ABNORMAL HIGH (ref 1.7–7.7)
Neutrophils Relative %: 92 %
Platelets: 161 10*3/uL (ref 150–400)
RBC: 4.77 MIL/uL (ref 4.22–5.81)
RDW: 14.2 % (ref 11.5–15.5)
WBC: 10.4 10*3/uL (ref 4.0–10.5)
nRBC: 0 % (ref 0.0–0.2)

## 2020-08-16 LAB — COMPREHENSIVE METABOLIC PANEL
ALT: 17 U/L (ref 0–44)
AST: 28 U/L (ref 15–41)
Albumin: 2.4 g/dL — ABNORMAL LOW (ref 3.5–5.0)
Alkaline Phosphatase: 67 U/L (ref 38–126)
Anion gap: 11 (ref 5–15)
BUN: 32 mg/dL — ABNORMAL HIGH (ref 8–23)
CO2: 20 mmol/L — ABNORMAL LOW (ref 22–32)
Calcium: 8.1 mg/dL — ABNORMAL LOW (ref 8.9–10.3)
Chloride: 105 mmol/L (ref 98–111)
Creatinine, Ser: 1.86 mg/dL — ABNORMAL HIGH (ref 0.61–1.24)
GFR calc non Af Amer: 33 mL/min — ABNORMAL LOW (ref >60)
Glucose, Bld: 152 mg/dL — ABNORMAL HIGH (ref 70–99)
Potassium: 3.5 mmol/L (ref 3.5–5.1)
Sodium: 136 mmol/L (ref 135–145)
Total Bilirubin: 0.8 mg/dL (ref 0.3–1.2)
Total Protein: 5.9 g/dL — ABNORMAL LOW (ref 6.5–8.1)

## 2020-08-16 LAB — D-DIMER, QUANTITATIVE: D-Dimer, Quant: 0.85 ug/mL-FEU — ABNORMAL HIGH (ref 0.00–0.50)

## 2020-08-16 LAB — GLUCOSE, CAPILLARY: Glucose-Capillary: 135 mg/dL — ABNORMAL HIGH (ref 70–99)

## 2020-08-16 LAB — ECHOCARDIOGRAM LIMITED
AV Peak grad: 12.8 mmHg
Ao pk vel: 1.79 m/s
Area-P 1/2: 3.1 cm2
Height: 64 in
S' Lateral: 3.89 cm
Weight: 2850.11 oz

## 2020-08-16 LAB — TROPONIN I (HIGH SENSITIVITY)
Troponin I (High Sensitivity): 658 ng/L (ref <18)
Troponin I (High Sensitivity): 716 ng/L (ref <18)
Troponin I (High Sensitivity): 684 ng/L (ref <18)

## 2020-08-16 LAB — LEGIONELLA PNEUMOPHILA SEROGP 1 UR AG: L. pneumophila Serogp 1 Ur Ag: NEGATIVE

## 2020-08-16 LAB — STREP PNEUMONIAE URINARY ANTIGEN: Strep Pneumo Urinary Antigen: NEGATIVE

## 2020-08-16 LAB — FERRITIN: Ferritin: 870 ng/mL — ABNORMAL HIGH (ref 24–336)

## 2020-08-16 LAB — PHOSPHORUS: Phosphorus: 4.4 mg/dL (ref 2.5–4.6)

## 2020-08-16 LAB — MAGNESIUM: Magnesium: 1.9 mg/dL (ref 1.7–2.4)

## 2020-08-16 LAB — HEPARIN LEVEL (UNFRACTIONATED): Heparin Unfractionated: 0.6 IU/mL (ref 0.30–0.70)

## 2020-08-16 LAB — C-REACTIVE PROTEIN: CRP: 26.9 mg/dL — ABNORMAL HIGH (ref <1.0)

## 2020-08-16 MED ORDER — HYDROXYZINE HCL 50 MG/ML IM SOLN
50.0000 mg | Freq: Once | INTRAMUSCULAR | Status: AC
  Administered 2020-08-17: 50 mg via INTRAMUSCULAR
  Filled 2020-08-16 (×2): qty 1

## 2020-08-16 MED ORDER — TRAZODONE HCL 50 MG PO TABS
50.0000 mg | ORAL_TABLET | Freq: Once | ORAL | Status: AC
  Administered 2020-08-16: 50 mg via ORAL
  Filled 2020-08-16: qty 1

## 2020-08-16 MED ORDER — HEPARIN BOLUS VIA INFUSION
2000.0000 [IU] | Freq: Once | INTRAVENOUS | Status: AC
  Administered 2020-08-16: 2000 [IU] via INTRAVENOUS
  Filled 2020-08-16: qty 2000

## 2020-08-16 MED ORDER — ATORVASTATIN CALCIUM 40 MG PO TABS
40.0000 mg | ORAL_TABLET | Freq: Every day | ORAL | Status: DC
  Administered 2020-08-17 – 2020-08-18 (×2): 40 mg via ORAL
  Filled 2020-08-16 (×3): qty 1

## 2020-08-16 MED ORDER — CLONIDINE HCL 0.1 MG PO TABS
0.1000 mg | ORAL_TABLET | Freq: Four times a day (QID) | ORAL | Status: DC | PRN
  Administered 2020-08-20: 0.1 mg via ORAL
  Filled 2020-08-16: qty 1

## 2020-08-16 MED ORDER — TECHNETIUM TO 99M ALBUMIN AGGREGATED
4.0000 | Freq: Once | INTRAVENOUS | Status: AC | PRN
  Administered 2020-08-16: 4 via INTRAVENOUS

## 2020-08-16 MED ORDER — CHLORHEXIDINE GLUCONATE CLOTH 2 % EX PADS
6.0000 | MEDICATED_PAD | Freq: Every day | CUTANEOUS | Status: DC
  Administered 2020-08-16 – 2020-08-20 (×6): 6 via TOPICAL

## 2020-08-16 MED ORDER — TOCILIZUMAB 400 MG/20ML IV SOLN
800.0000 mg | Freq: Once | INTRAVENOUS | Status: AC
  Administered 2020-08-17: 800 mg via INTRAVENOUS
  Filled 2020-08-16: qty 40

## 2020-08-16 MED ORDER — GLUCERNA SHAKE PO LIQD
237.0000 mL | Freq: Three times a day (TID) | ORAL | Status: DC
  Administered 2020-08-17 – 2020-08-18 (×4): 237 mL via ORAL

## 2020-08-16 MED ORDER — METOPROLOL TARTRATE 25 MG PO TABS
25.0000 mg | ORAL_TABLET | Freq: Two times a day (BID) | ORAL | Status: DC
  Administered 2020-08-16 – 2020-08-19 (×6): 25 mg via ORAL
  Filled 2020-08-16 (×6): qty 1

## 2020-08-16 MED ORDER — METHYLPREDNISOLONE SODIUM SUCC 40 MG IJ SOLR
40.0000 mg | Freq: Three times a day (TID) | INTRAMUSCULAR | Status: DC
  Administered 2020-08-16 – 2020-08-20 (×12): 40 mg via INTRAVENOUS
  Filled 2020-08-16 (×11): qty 1

## 2020-08-16 MED ORDER — HEPARIN (PORCINE) 25000 UT/250ML-% IV SOLN
1000.0000 [IU]/h | INTRAVENOUS | Status: AC
  Administered 2020-08-16 – 2020-08-17 (×2): 1000 [IU]/h via INTRAVENOUS
  Filled 2020-08-16 (×4): qty 250

## 2020-08-16 MED ORDER — SODIUM CHLORIDE 0.9 % IV SOLN
500.0000 mg | INTRAVENOUS | Status: AC
  Administered 2020-08-16 – 2020-08-18 (×3): 500 mg via INTRAVENOUS
  Filled 2020-08-16 (×3): qty 500

## 2020-08-16 MED ORDER — ASPIRIN EC 81 MG PO TBEC
81.0000 mg | DELAYED_RELEASE_TABLET | Freq: Every day | ORAL | Status: DC
  Administered 2020-08-17 – 2020-08-18 (×2): 81 mg via ORAL
  Filled 2020-08-16 (×3): qty 1

## 2020-08-16 MED ORDER — INSULIN ASPART 100 UNIT/ML ~~LOC~~ SOLN: 0.0000 [IU] | Freq: Every day | SUBCUTANEOUS | Status: DC

## 2020-08-16 MED ORDER — INSULIN ASPART 100 UNIT/ML ~~LOC~~ SOLN
0.0000 [IU] | Freq: Three times a day (TID) | SUBCUTANEOUS | Status: DC
  Administered 2020-08-17 – 2020-08-19 (×7): 1 [IU] via SUBCUTANEOUS
  Administered 2020-08-19 – 2020-08-20 (×3): 2 [IU] via SUBCUTANEOUS
  Administered 2020-08-20: 1 [IU] via SUBCUTANEOUS

## 2020-08-16 NOTE — Progress Notes (Signed)
CRITICAL VALUE ALERT  Critical Value:  Troponin 716  Date & Time Notied:  08/16/2020 0602  Provider Notified: Dr. Josephine Cables

## 2020-08-16 NOTE — Progress Notes (Signed)
Patient has continued to mess with mask that he broke seal around right side of mask.  Took off and replaced and also placed a foam dressing on nose to lessen irritation.  Patient has been warned to leave mask alone.  It is hard to get good seal due to patient's facial hair.  Currently have a leak of 77 which is the best that I have had.

## 2020-08-16 NOTE — Progress Notes (Signed)
ANTICOAGULATION CONSULT NOTE - Initial Consult  Pharmacy Consult for Heparin Indication: chest pain/ACS  Allergies  Allergen Reactions  . Amoxicillin     Other reaction(s): Unknown pt can take cephalosporin.    Patient Measurements: Height: 5\' 4"  (162.6 cm) Weight: 80.8 kg (178 lb 2.1 oz) IBW/kg (Calculated) : 59.2 Heparin Dosing Weight: 75 kg  Vital Signs: Temp: 98 F (36.7 C) (10/08 1225) BP: 146/70 (10/08 0900) Pulse Rate: 66 (10/08 0900)  Labs: Recent Labs    08/10/2020 1318 08/24/2020 1318 08/22/2020 1541 09/08/2020 1757 08/16/20 0219 08/16/20 0456 08/16/20 0640 08/16/20 1524  HGB 13.2   < > 13.0  --  12.8*  --   --   --   HCT 40.5  --  38.9*  --  37.7*  --   --   --   PLT 169  --  153  --  161  --   --   --   APTT 33  --   --   --   --   --   --   --   LABPROT 15.7*  --   --   --   --   --   --   --   INR 1.3*  --   --   --   --   --   --   --   HEPARINUNFRC  --   --   --   --   --   --   --  0.60  CREATININE 2.11*  --   --   --  1.86*  --   --   --   TROPONINIHS  --   --  327*   < > 658* 716* 684*  --    < > = values in this interval not displayed.    Estimated Creatinine Clearance: 29.9 mL/min (A) (by C-G formula based on SCr of 1.86 mg/dL (H)).   Medical History: Past Medical History:  Diagnosis Date  . Arthritis   . CHF (congestive heart failure) ([place])   . Chronic constipation   . Coronary atherosclerosis of native coronary artery    a. s/p CABG in 2002 b. low-risk NST in 2013  . Diverticulosis 2007   Colonoscopy  . Essential hypertension   . Fatty liver disease, nonalcoholic   . GERD (gastroesophageal reflux disease)   . Headache(784.0)   . Hiatal hernia   . Ischemic cardiomyopathy    a. LVEF previously reduced to 35% with normalization since by review of prior notes  . Mixed hyperlipidemia   . Myocardial infarction ([place])   . Nephrolithiasis   . Renal insufficiency   . Schatzki's ring    Last EGD/ED Dr Paul Half    Medications:   Scheduled:  . albuterol  2 puff Inhalation Q6H  . vitamin C  500 mg Oral Daily  . [START ON 08/17/2020] aspirin EC  81 mg Oral Q breakfast  . atorvastatin  40 mg Oral Daily  . Chlorhexidine Gluconate Cloth  6 each Topical Daily  . dexamethasone (DECADRON) injection  6 mg Intravenous Q24H  . furosemide  40 mg Intravenous Q12H  . metoprolol tartrate  25 mg Oral BID  . pantoprazole (PROTONIX) IV  40 mg Intravenous Q24H  . zinc sulfate  220 mg Oral Daily    Assessment: 84 y.o. male with COVID PNA and CHF, elevated troponins, for heparin.  On Heparin 5000 units SQ q8h for DVT prophylaxis, last dose at 0440  HL 0.60, therapeutic  Goal of Therapy:  Heparin level 0.3-0.7 units/ml Monitor platelets by anticoagulation protocol: Yes   Plan:  Continue heparin 1000 units/hr Check heparin level in 8 hours.   Donna Christen Biddie Sebek 08/16/2020,6:04 PM

## 2020-08-16 NOTE — Progress Notes (Signed)
Has trouble prone ( can't or won't ) did cpt with bed started incentive.

## 2020-08-16 NOTE — Progress Notes (Addendum)
NAME:  [name] [name], MRN:  [id number], DOB:  15-Apr-1936, LOS: 1 ADMISSION DATE:  09/01/2020, CONSULTATION DATE:  10/7 REFERRING MD:  [place]/ EDP, CHIEF COMPLAINT:  sob   Brief History   83 yowm with progressive sob with bilateral infiltrates and POS covid testing and PCCM consulted pm 10/7 for ? Need for life support   History of present illness   10/6 televisit with pcp:  Patient complains of cough, chest congestion, sneezing, shortness of breath, wheezing and diarrhea. Onset of symptoms was last week in September,  unchanged since that time. rx zpak/ medrol dose pack   Wife passed from covid 10/6 and both tested on 9/30 and he was neg and vacinated but progressive sob > ER with bilateral infiltrates and placed on bipap and PCCM consulted.  No additional hx avail from pt.  Daughter is RT at [facility] and he was initially NCB but she is POA and he agreed to short term life support if needed.    Past Medical History  CHF EF 35%  HBP CRI  Significant Hospital Events      Consults:  PCCM  Procedures:    Significant Diagnostic Tests:  Q scan 10/ 8  wnl  Echo    10/8 >>>  Micro Data:  MRSA  PCR  10/7 neg Urine pneum 10/7 >>> neg  Urine strep  10/7 >>> neg  covid 19 PCR  10/7  Pos BC x 2  10/7   >>> UC  10/7 >>>   ID rx   zpak  10/6 only Levaquin 10/7 only  Remdesovir  10/7 >>> Doxy  10/7  >>> Rocephin  10/8  >>>   Scheduled Meds: . albuterol  2 puff Inhalation Q6H  . vitamin C  500 mg Oral Daily  . Chlorhexidine Gluconate Cloth  6 each Topical Daily  . dexamethasone (DECADRON) injection  6 mg Intravenous Q24H  . furosemide  40 mg Intravenous Q12H  . pantoprazole (PROTONIX) IV  40 mg Intravenous Q24H  . zinc sulfate  220 mg Oral Daily   Continuous Infusions: . cefTRIAXone (ROCEPHIN)  IV 1 g (08/16/20 1223)  . doxycycline (VIBRAMYCIN) IV 100 mg (08/16/20 1437)  . heparin 1,000 Units/hr (08/16/20 0641)  . remdesivir 100 mg in NS 100 mL 100 mg (08/16/20 1041)    PRN Meds:.  Interim history/subjective:  Resting on bipap on am rounds with sats around 89% on FIO2 0.75   Objective   Blood pressure (!) 146/70, pulse 66, temperature 98 F (36.7 C), resp. rate (!) 42, height 5\' 4"  (1.626 m), weight 80.8 kg, SpO2 90 %.    Vent Mode: BIPAP FiO2 (%):  [75 %-100 %] 100 % Set Rate:  [22 bmp] 22 bmp PEEP:  [8 cmH20] 8 cmH20 Plateau Pressure:  [11 cmH20] 11 cmH20   Intake/Output Summary (Last 24 hours) at 08/16/2020 1441 Last data filed at 08/16/2020 0641 Gross per 24 hour  Intake 750 ml  Output 400 ml  Net 350 ml   Filed Weights   09/07/2020 1342 08/26/2020 1915 08/16/20 0355  Weight: 86.2 kg 81.7 kg 80.8 kg    Examination: Tmax  99.7  Pt alert, approp nad @ 30 degrees  No jvd Neck supple Lungs with a few scattered exp > insp rhonchi bilaterally RRR no s3 or or sign murmur Abd obese with limted  excursion  Extr warm with no edema or clubbing noted Neuro  Sensorium appears intact though somber affect,  no apparent motor deficits  Resolved Hospital Problem list      Assessment & Plan:  1)  Acute hypoxemic resp failure in pt with covid 19 infection s/p vaccination ? May 2021  -  MRSA less likely with neg PCR  But note PCT elevated not typical for covid so agree with Rocephin/doxy   >>>  Continue bipap prn/ diuresis/ abx/ steroids and check serial PCT   2) CHF/ EF 35% with pos troponins R/o MI with serial troponins and f/u cards planned >>> check echo w/a  3) acute on chronic Renal insuff on ACEi PTA Lab Results  Component Value Date   CREATININE 1.86 (H) 08/16/2020   CREATININE 2.11 (H) 08/29/2020   CREATININE 1.46 (H) 03/06/2020  Pt remains with reduced uop but creat a bit better   >> foley, avoid toxins/ hypotension/ no more acei  4)  HBP  - hold  acei >> add clonidine prn pm 6/8   5) Low albumin on admit c/w protein calorie malnutrition and a poor prognostic sign   Best practice:  Diet: npo Pain/Anxiety/Delirium  protocol (if indicated): per [facility] VAP protocol (if indicated):  DVT prophylaxis: Heparin IV GI prophylaxis: PPI Glucose control: per [facility] Mobility: sbr  For now Code Status: full code for now Family Communication: spoke to daughter [name] 10/7, wants all reasonable care that will restore previous qol, not prolonged vent Disposition: ICU  Labs   CBC: Recent Labs  Lab 09/06/2020 1318 08/25/2020 1541 08/16/20 0219  WBC 15.7* 13.5* 10.4  NEUTROABS 14.6* 12.7* 9.6*  HGB 13.2 13.0 12.8*  HCT 40.5 38.9* 37.7*  MCV 81.7 80.4 79.0*  PLT 169 153 681    Basic Metabolic Panel: Recent Labs  Lab 08/13/2020 1318 09/04/2020 1541 08/16/20 0219  NA 131*  --  136  K 4.0  --  3.5  CL 101  --  105  CO2 18*  --  20*  GLUCOSE 234*  --  152*  BUN 30*  --  32*  CREATININE 2.11*  --  1.86*  CALCIUM 8.1*  --  8.1*  MG  --  1.9 1.9  PHOS  --   --  4.4   GFR: Estimated Creatinine Clearance: 29.9 mL/min (A) (by C-G formula based on SCr of 1.86 mg/dL (H)). Recent Labs  Lab 08/19/2020 1318 08/18/2020 1410 09/02/2020 1541 08/16/20 0219  PROCALCITON  --   --  1.85  --   WBC 15.7*  --  13.5* 10.4  LATICACIDVEN  --  3.2* 1.6  --     Liver Function Tests: Recent Labs  Lab 08/30/2020 1318 08/16/20 0219  AST 31 28  ALT 17 17  ALKPHOS 71 67  BILITOT 1.4* 0.8  PROT 6.1* 5.9*  ALBUMIN 2.5* 2.4*   No results for input(s): LIPASE, AMYLASE in the last 168 hours. No results for input(s): AMMONIA in the last 168 hours.  ABG    Component Value Date/Time   PHART 7.287 (L) 09/05/2020 1342   PCO2ART 40.6 08/29/2020 1342   PO2ART 43.1 (L) 08/17/2020 1342   HCO3 18.3 (L) 09/07/2020 1342   TCO2 27 07/06/2014 1412   ACIDBASEDEF 6.6 (H) 08/22/2020 1342   O2SAT 68.0 08/27/2020 1342     Coagulation Profile: Recent Labs  Lab 08/14/2020 1318  INR 1.3*    Cardiac Enzymes: No results for input(s): CKTOTAL, CKMB, CKMBINDEX, TROPONINI in the last 168 hours.  HbA1C: No results found for:  HGBA1C  CBG: No results for input(s): GLUCAP in the last 168 hours.  The patient is critically ill with multiple organ systems failure and requires high complexity decision making for assessment and support, frequent evaluation and titration of therapies, application of advanced monitoring technologies and extensive interpretation of multiple databases. Critical Care Time devoted to patient care services described in this note is 35 minutes.    Christinia Gully, MD Pulmonary and [place] [phone]   After 7:00 pm call Elink  [phone]

## 2020-08-16 NOTE — Progress Notes (Signed)
RN called due to patient's elevated troponin (327> 334> 439> 658> 716), he was admitted yesterday due to acute hypoxic respiratory failure secondary to COVID-19 virus pneumonia. EKG was done with no change from EKG on admission except for improved QT interval (now 483 vs 501).  D-dimer was also elevated at 0.85 (which may be due to Covid 19 virus pneumonia) and unfortunately CT angiography of chest cannot be done to rule out PE due to patient having AKI Patient denies any chest pain and was in no acute distress.  This is possibly secondary to type II demand ischemia.  However, considering the level of troponin as well as elevated D-dimer, patient will be started on IV heparin drip with plan to do VQ scan in the morning.  Consult was placed for cardiology due to the elevated troponin.  We shall await further recommendation.

## 2020-08-16 NOTE — Progress Notes (Addendum)
Gave patient his inhaler.  He did not want to go back on Bipap unless he had to,  so I had patient lay on his side.  Also readjusted his HFNC, it had moved over to the side.  Patient's sats are now above 90%.  Will continue to monitor.  RN made aware.

## 2020-08-16 NOTE — Progress Notes (Signed)
ANTICOAGULATION CONSULT NOTE - Initial Consult  Pharmacy Consult for Heparin Indication: chest pain/ACS  Allergies  Allergen Reactions  . Amoxicillin     Other reaction(s): Unknown    Patient Measurements: Height: 5\' 4"  (162.6 cm) Weight: 80.8 kg (178 lb 2.1 oz) IBW/kg (Calculated) : 59.2 Heparin Dosing Weight: 75 kg  Vital Signs: Temp: 97.9 F (36.6 C) (10/08 0500) Temp Source: Axillary (10/08 0500) BP: 128/78 (10/08 0600) Pulse Rate: 60 (10/08 0600)  Labs: Recent Labs    09/08/2020 1318 09/03/2020 1318 08/12/2020 1541 09/07/2020 1757 08/19/2020 1941 08/16/20 0219 08/16/20 0456  HGB 13.2   < > 13.0  --   --  12.8*  --   HCT 40.5  --  38.9*  --   --  37.7*  --   PLT 169  --  153  --   --  161  --   APTT 33  --   --   --   --   --   --   LABPROT 15.7*  --   --   --   --   --   --   INR 1.3*  --   --   --   --   --   --   CREATININE 2.11*  --   --   --   --  1.86*  --   TROPONINIHS  --   --  327*   < > 439* 658* 716*   < > = values in this interval not displayed.    Estimated Creatinine Clearance: 29.9 mL/min (A) (by C-G formula based on SCr of 1.86 mg/dL (H)).   Medical History: Past Medical History:  Diagnosis Date  . Arthritis   . CHF (congestive heart failure) ([place])   . Chronic constipation   . Coronary atherosclerosis of native coronary artery    a. s/p CABG in 2002 b. low-risk NST in 2013  . Diverticulosis 2007   Colonoscopy  . Essential hypertension   . Fatty liver disease, nonalcoholic   . GERD (gastroesophageal reflux disease)   . Headache(784.0)   . Hiatal hernia   . Ischemic cardiomyopathy    a. LVEF previously reduced to 35% with normalization since by review of prior notes  . Mixed hyperlipidemia   . Myocardial infarction ([place])   . Nephrolithiasis   . Renal insufficiency   . Schatzki's ring    Last EGD/ED Dr Paul Half    Medications:  Scheduled:  . albuterol  2 puff Inhalation Q6H  . vitamin C  500 mg Oral Daily  . Chlorhexidine Gluconate  Cloth  6 each Topical Daily  . dexamethasone (DECADRON) injection  6 mg Intravenous Q24H  . furosemide  40 mg Intravenous Q12H  . pantoprazole (PROTONIX) IV  40 mg Intravenous Q24H  . zinc sulfate  220 mg Oral Daily    Assessment: 84 y.o. male with COVID PNA and CHF, elevated troponins, for heparin.  On Heparin 5000 units SQ q8h for DVT prophylaxis, last dose at 0440  Goal of Therapy:  Heparin level 0.3-0.7 units/ml Monitor platelets by anticoagulation protocol: Yes   Plan:  Heparin 2000 units IV bolus, then start heparin 1000 units/hr Check heparin level in 8 hours.   Kenzlee Fishburn, Bronson Curb 08/16/2020,6:18 AM

## 2020-08-16 NOTE — Progress Notes (Signed)
CRITICAL VALUE ALERT  Critical Value:  Troponin 658  Date & Time Notied:  08/16/2020   0315  Provider Notified: Dr. Josephine Cables

## 2020-08-16 NOTE — Progress Notes (Signed)
  Echocardiogram 2D Echocardiogram has been performed.  [name] [name] 08/16/2020, 2:13 PM

## 2020-08-16 NOTE — Progress Notes (Signed)
Patient Demographics:    [name] [name], is a 84 y.o. male, DOB - 11/18/1935, HQR:[id number]  Admit date - 08/21/2020   Admitting Physician Ejiroghene Arlyce Dice, MD  Outpatient Primary MD for the patient is [name], MD  LOS - 1   Chief Complaint  Patient presents with  . Shortness of Breath        Subjective:    [name] [name] today has persistent cough and shortness of breath with persistent hypoxia  -Requiring BiPAP alternating with heated high flow oxygen at 100% FiO2 with 50 L in addition to a nonrebreather bag   Assessment  & Plan :    Principal Problem:   Pneumonia due to COVID-19 virus Active Problems:   Acute respiratory failure with hypoxemia (HCC)   Acute respiratory failure with hypoxia (HCC)   Essential hypertension, benign   Acute renal failure superimposed on stage 3 chronic kidney disease (HCC)   CHF (congestive heart failure) (HCC)   Protein calorie malnutrition (HCC)   NSTEMI (non-ST elevated myocardial infarction) [facility])  Brief Summary:- 85 y.o. male with medical history significant for CABG, hypertension, ischemic cardiomyopathy, BPH admitted 08/16/2020 with severe Acute Hypoxic Resp Failure due to Covid Pneumonia and found to have elevated troponins with echocardiogram showing wall motion abnormalities and EF of 45 to 50%   A/p 1)Acute hypoxic respiratory failure secondary to Covid pneumonia--- -patient previously vaccinated Dxed 08/14/20 -Severe persistent hypoxia--- alternating between BiPAP and heated high flow oxygen with FiO2 of 100% and 50 L -VQ scan negative for PE -Lower extremity Dopplers requested -Continue IV steroids and remdesivir started on 09/08/2020 -Given persistent severe hypoxia discussed with patient and daughter regarding Actemra initiation The patient has hypoxia and is high-risk for intubation, but expected to survive >48 hours and has good  baseline functional status.  She is not known to be on immunomodulators, anti-rejection medications, or cancer chemotherapy, has no history of TB or latent TB, and no history of diverticulitis or intestinal perforation.  Platelets are >50K, ANC is >500, and ALT/AST are below 5x ULN with no known hepatitis B infection. Baricitinib is being used under EUA by the FDA. The patient has no ESRD or AKI, known history of TB, severe neutropenia (ANC <500) or lymphopenia, or severe LFT elevations. They are not on DMARDs or probenecid, and are not pregnant. The option to use/refuse baricitinib treatment under FDA authorization (not approval), the significant known and potential risks and benefits, the extent to which these are unknown, and information regarding all available alternatives were discussed in detail. Specifically the risk of VTE and secondary infections were discussed in detail with the patient and/or HCPOA. They consent to proceed with treatment. - Continue Baricitinib/Actemra, c/n 14 days or until hospital discharge. Monitor Cr, LFTs, differential. Keep on VTE ppx COVID-19 Labs  Recent Labs    09/03/2020 1541 08/16/20 0219  DDIMER 0.64* 0.85*  FERRITIN 867* 870*  LDH 352*  --   CRP 29.0* 26.9*    Lab Results  Component Value Date   SARSCOV2NAA POSITIVE (A) 08/17/2020   SARSCOV2NAA Detected (A) 08/14/2020   [place] Not Detected 10/19/2019   -Encourage prone positioning for More than 16 hours/day in increments of 2 to 3 hours at a time if able to tolerate --  Attempt to maintain euvolemic state --Zinc and vitamin C as ordered -Albuterol inhaler as needed -Accu-Cheks/fingersticks while on high-dose steroids -PPI while on high-dose steroids -Enhanced dosage of anticoagulant for DVT prophylaxis given hypercoagulable state with COVID-19 infections   2)NSTEMI-elevated troponins noted, suspect this is related to severe hypoxia in the setting of Covid 19 pneumonia with acute hypoxic  respiratory failure ----echo from 08/16/2020 with EF of 45 to 50% which is similar to prior EF based on gated images from April 2019 Echo from 08/16/2020 showed wall motion abnormalities-The basal inferolateral segment and basal inferior segment are hypokinetic -Continue IV heparin at least for 48 hours -Aspirin, metoprolol and Lipitor ordered -Patient remains chest pain-free Troponin--- 384>>439>>[phone]>>[phone]>>[phone] -He did very hypoxic currently requiring -Requiring BiPAP alternating with heated high flow oxygen at 100% FiO2 with 50 L in addition to a nonrebreather bag -QT segment was prolonged on admission--monitor mag and potassium closely  3) possible concomitant community-acquired bacterial pneumonia--- patient with leukocytosis and elevated procalcitonin at 1.85 noted -Okay to continue Rocephin and azithromycin at this time -Strep and Legionella antigen requested  4)HFrEF--patient with history of systolic dysfunction CHF/ischemic cardiomyopathy--- repeat echo with EF of 45 to 50% with wall motion abnormalities  5)AKI----acute kidney injury on CKD stage - IIIb   --- due to decreased p.o. intake in the setting of Covid infection -creatinine on admission= 2.11  , baseline creatinine = 1.3 to 1.4 (03/07/20)    , creatinine is now=1.86  ,  -Patient has only one kidney --renally adjust medications, avoid nephrotoxic agents / dehydration  / hypotension  6)Social/Ethics--- plan of care discussed with patient and daughter, patient is full code with full scope of treatment -Patient's wife just passed away from COVID-19 infection -Risk versus benefits of Actemra units discussed with patient and daughter consent obtained for administration of Actemra   Disposition/Need for in-Hospital Stay- patient unable to be discharged at this time due to --severe hypoxic respiratory failure secondary to COVID-19 infection/pneumonia requiring IV remdesivir, IV steroids and high flow supplemental oxygen on BiPAP, as  well as NSTEMI requiring IV heparin  Status is: Inpatient  Remains inpatient appropriate because:severe hypoxic respiratory failure secondary to COVID-19 infection/pneumonia requiring IV remdesivir, IV steroids and high flow supplemental oxygen on BiPAP, as well as NSTEMI requiring IV heparin   Disposition: The patient is from: Home              Anticipated d/c is to: Home              Anticipated d/c date is: > 3 days              Patient currently is not medically stable to d/c. Barriers: Not Clinically Stable- severe hypoxic respiratory failure secondary to COVID-19 infection/pneumonia requiring IV remdesivir, IV steroids and high flow supplemental oxygen on BiPAP, as well as NSTEMI requiring IV heparin  Code Status : Full Code  Family Communication:     (patient is alert, awake and coherent)  -Discussed with patient daughter [name] who is a respiratory therapist  Consults  :  PCCM  DVT Prophylaxis  : IV heparin SCDs   Lab Results  Component Value Date   PLT 161 08/16/2020    Inpatient Medications  Scheduled Meds: . albuterol  2 puff Inhalation Q6H  . vitamin C  500 mg Oral Daily  . [START ON 08/17/2020] aspirin EC  81 mg Oral Q breakfast  . atorvastatin  40 mg Oral Daily  . Chlorhexidine Gluconate Cloth  6 each  Topical Daily  . dexamethasone (DECADRON) injection  6 mg Intravenous Q24H  . furosemide  40 mg Intravenous Q12H  . metoprolol tartrate  25 mg Oral BID  . pantoprazole (PROTONIX) IV  40 mg Intravenous Q24H  . zinc sulfate  220 mg Oral Daily   Continuous Infusions: . azithromycin    . cefTRIAXone (ROCEPHIN)  IV 1 g (08/16/20 1223)  . heparin 1,000 Units/hr (08/16/20 0641)  . remdesivir 100 mg in NS 100 mL 100 mg (08/16/20 1041)  . tocilizumab (ACTEMRA) - non-COVID treatment     PRN Meds:.acetaminophen, cloNIDine, guaiFENesin-dextromethorphan, ondansetron **OR** ondansetron (ZOFRAN) IV, polyethylene glycol    Anti-infectives (From admission, onward)   Start      Dose/Rate Route Frequency Ordered Stop   08/16/20 1730  azithromycin (ZITHROMAX) 500 mg in sodium chloride 0.9 % 250 mL IVPB        500 mg 250 mL/hr over 60 Minutes Intravenous Every 24 hours 08/16/20 1728     08/16/20 1300  cefTRIAXone (ROCEPHIN) 1 g in sodium chloride 0.9 % 100 mL IVPB        1 g 200 mL/hr over 30 Minutes Intravenous Every 24 hours 08/26/2020 2055     08/16/20 1000  remdesivir 100 mg in sodium chloride 0.9 % 100 mL IVPB        100 mg 200 mL/hr over 30 Minutes Intravenous Daily 08/10/2020 1728 September 19, 2020 0959   08/16/20 0100  doxycycline (VIBRAMYCIN) 100 mg in sodium chloride 0.9 % 250 mL IVPB  Status:  Discontinued        100 mg 125 mL/hr over 120 Minutes Intravenous Every 12 hours 09/07/2020 2055 08/16/20 1728   09/02/2020 1730  remdesivir 100 mg in sodium chloride 0.9 % 100 mL IVPB        100 mg 200 mL/hr over 30 Minutes Intravenous Every 1 hr x 2 08/26/2020 1728 09/05/2020 2008   09/06/2020 1330  levofloxacin (LEVAQUIN) IVPB 750 mg        750 mg 100 mL/hr over 90 Minutes Intravenous  Once 08/17/2020 1318 08/25/2020 1418        Objective:   Vitals:   08/16/20 0900 08/16/20 0904 08/16/20 1225 08/16/20 1324  BP: (!) 146/70     Pulse: 66     Resp: (!) 42     Temp: 97.9 F (36.6 C)  98 F (36.7 C)   TempSrc:      SpO2: (!) 88% (!) 89%  90%  Weight:      Height:        Wt Readings from Last 3 Encounters:  08/16/20 80.8 kg  07/10/20 81.6 kg  05/07/20 81.7 kg     Intake/Output Summary (Last 24 hours) at 08/16/2020 1803 Last data filed at 08/16/2020 1223 Gross per 24 hour  Intake 1200 ml  Output 400 ml  Net 800 ml    Physical Exam  Gen:- Awake Alert, conversational dyspnea HEENT:- [place].AT, No sclera icterus Nose- Bipap Mask alternating with HFNC Neck-Supple Neck,No JVD,.  Lungs-diminished breath sounds with scattered rhonchi CV- S1, S2 normal, regular  Abd-  +ve B.Sounds, Abd Soft, No tenderness,    Extremity/Skin:-Trace to +1 edema, pedal pulses present    Psych-affect is appropriate, oriented x3 Neuro-paralyzed weakness, no new focal deficits, no tremors   Data Review:   Micro Results Recent Results (from the past 240 hour(s))  Novel Coronavirus, NAA ([facility])     Status: Abnormal   Collection Time: 08/14/20  3:50 PM   Specimen: Nasopharyngeal(NP)  swabs in vial transport medium  Result Value Ref Range Status   SARS-CoV-2, NAA Detected (A) Not Detected Final    Comment: Patients who have a positive COVID-19 test result may now have treatment options. Treatment options are available for patients with mild to moderate symptoms and for hospitalized patients. Visit our website at [email or web address] for resources and information. This nucleic acid amplification test was developed and its performance characteristics determined by [employer]. Nucleic acid amplification tests include RT-PCR and TMA. This test has not been FDA cleared or approved. This test has been authorized by FDA under an Emergency Use Authorization (EUA). This test is only authorized for the duration of time the declaration that circumstances exist justifying the authorization of the emergency use of in vitro diagnostic tests for detection of SARS-CoV-2 virus and/or diagnosis of COVID-19 infection under section 564(b)(1) of the Act, 21 U.S.C. 315VVO-1(Y) (1), unless the authorization is terminated or revoked sooner. When diagnostic testing is negativ e, the possibility of a false negative result should be considered in the context of a patient's recent exposures and the presence of clinical signs and symptoms consistent with COVID-19. An individual without symptoms of COVID-19 and who is not shedding SARS-CoV-2 virus would expect to have a negative (not detected) result in this assay.   SARS-COV-2, NAA 2 DAY TAT     Status: None   Collection Time: 08/14/20  3:50 PM  Result Value Ref Range Status   SARS-CoV-2, NAA 2 DAY TAT Performed  Final   Resp Panel by RT PCR (RSV, Flu A&B, Covid) - Nasopharyngeal Swab     Status: Abnormal   Collection Time: 08/10/2020  1:13 PM   Specimen: Nasopharyngeal Swab  Result Value Ref Range Status   SARS Coronavirus 2 by RT PCR POSITIVE (A) NEGATIVE Final    Comment: RESULT CALLED TO, READ BACK BY AND VERIFIED WITH: DR. ZAMMIT AT 0737 ON [id number] BY THOMPSON S. (NOTE) SARS-CoV-2 target nucleic acids are DETECTED.  SARS-CoV-2 RNA is generally detectable in upper respiratory specimens  during the acute phase of infection. Positive results are indicative of the presence of the identified virus, but do not rule out bacterial infection or co-infection with other pathogens not detected by the test. Clinical correlation with patient history and other diagnostic information is necessary to determine patient infection status. The expected result is Negative.  Fact Sheet for Patients:  PinkCheek.be  Fact Sheet for Healthcare Providers: GravelBags.it  This test is not yet approved or cleared by the [place] FDA and  has been authorized for detection and/or diagnosis of SARS-CoV-2 by FDA under an Emergency Use Authorization (EUA).  This EUA will remain in effect (meaning this tes t can be used) for the duration of  the COVID-19 declaration under Section 564(b)(1) of the Act, 21 U.S.C. section 360bbb-3(b)(1), unless the authorization is terminated or revoked sooner.      Influenza A by PCR NEGATIVE NEGATIVE Final   Influenza B by PCR NEGATIVE NEGATIVE Final    Comment: (NOTE) The Xpert Xpress SARS-CoV-2/FLU/RSV assay is intended as an aid in  the diagnosis of influenza from Nasopharyngeal swab specimens and  should not be used as a sole basis for treatment. Nasal washings and  aspirates are unacceptable for Xpert Xpress SARS-CoV-2/FLU/RSV  testing.  Fact Sheet for Patients: PinkCheek.be  Fact Sheet for  Healthcare Providers: GravelBags.it  This test is not yet approved or cleared by the [place] FDA and  has been authorized for detection and/or diagnosis of  SARS-CoV-2 by  FDA under an Emergency Use Authorization (EUA). This EUA will remain  in effect (meaning this test can be used) for the duration of the  Covid-19 declaration under Section 564(b)(1) of the Act, 21  U.S.C. section 360bbb-3(b)(1), unless the authorization is  terminated or revoked.    Respiratory Syncytial Virus by PCR NEGATIVE NEGATIVE Final    Comment: (NOTE) Fact Sheet for Patients: PinkCheek.be  Fact Sheet for Healthcare Providers: GravelBags.it  This test is not yet approved or cleared by the [place] FDA and  has been authorized for detection and/or diagnosis of SARS-CoV-2 by  FDA under an Emergency Use Authorization (EUA). This EUA will remain  in effect (meaning this test can be used) for the duration of the  COVID-19 declaration under Section 564(b)(1) of the Act, 21 U.S.C.  section 360bbb-3(b)(1), unless the authorization is terminated or  revoked. Performed at [facility], [address], [place], [place] [address]   Blood Culture (routine x 2)     Status: None (Preliminary result)   Collection Time: 08/11/2020  1:42 PM   Specimen: BLOOD LEFT HAND  Result Value Ref Range Status   Specimen Description BLOOD LEFT HAND  Final   Special Requests   Final    BOTTLES DRAWN AEROBIC AND ANAEROBIC Blood Culture adequate volume   Culture   Final    NO GROWTH < 24 HOURS Performed at [facility], [address], [place], [place] [address]    Report Status PENDING  Incomplete  Blood Culture (routine x 2)     Status: None (Preliminary result)   Collection Time: 08/29/2020  1:42 PM   Specimen: BLOOD LEFT ARM  Result Value Ref Range Status   Specimen Description BLOOD LEFT ARM  Final   Special Requests   Final    BOTTLES  DRAWN AEROBIC ONLY Blood Culture results may not be optimal due to an inadequate volume of blood received in culture bottles   Culture   Final    NO GROWTH < 24 HOURS Performed at [facility], [address] [place], [place] [address]    Report Status PENDING  Incomplete  MRSA PCR Screening     Status: None   Collection Time: 08/28/2020  7:55 PM   Specimen: Nasal Mucosa; Nasopharyngeal  Result Value Ref Range Status   MRSA by PCR NEGATIVE NEGATIVE Final    Comment:        The GeneXpert MRSA Assay (FDA approved for NASAL specimens only), is one component of a comprehensive MRSA colonization surveillance program. It is not intended to diagnose MRSA infection nor to guide or monitor treatment for MRSA infections. Performed at [facility], [address] [place], [place] [address]     Radiology Reports NM Pulmonary Perf and Vent  Result Date: 08/16/2020 CLINICAL DATA:  Elevated D-dimer, COVID-19 positive EXAM: NUCLEAR MEDICINE PERFUSION LUNG SCAN TECHNIQUE: Perfusion images were obtained in multiple projections after intravenous injection of radiopharmaceutical. Ventilation scans intentionally deferred if perfusion scan and chest x-ray adequate for interpretation during COVID 19 epidemic. RADIOPHARMACEUTICALS:  4 mCi Tc-62m MAA IV COMPARISON:  None Correlation: Chest radiograph 09/01/2020 FINDINGS: Slightly diminished perfusion diffusely in LEFT lung versus RIGHT. Perfusion is much better than expected versus LEFT lung infiltrates seen on chest radiograph. No segmental or subsegmental perfusion defects. IMPRESSION: No scintigraphic evidence of pulmonary embolism. Electronically Signed   By: Lavonia Dana M.D.   On: 08/16/2020 14:35   DG Chest Port 1 View  Result Date: 08/29/2020 CLINICAL  DATA:  Shortness of breath, oxygen desaturation EXAM: PORTABLE CHEST 1 VIEW COMPARISON:  Portable exam 1320 hours without priors for comparison FINDINGS: Lordotic positioning limits exam. Enlargement of  cardiac silhouette with slight vascular congestion post CABG. Mediastinal contours normal. Subsegmental atelectasis RIGHT base. Hazy infiltrates in LEFT lung which could represent infection or less likely asymmetric edema. New no pleural effusion or pneumothorax. Bones demineralized. IMPRESSION: Hazy infiltrates in LEFT lung question pneumonia less likely asymmetric edema. Enlargement of cardiac silhouette with pulmonary vascular congestion. Atelectasis at RIGHT base. Electronically Signed   By: Lavonia Dana M.D.   On: 08/28/2020 13:54   ECHOCARDIOGRAM LIMITED  Result Date: 08/16/2020    ECHOCARDIOGRAM LIMITED REPORT   Patient Name:   DACIAN ORRICO Date of Exam: 08/16/2020 Medical Rec #:  [id number]        Height:       64.0 in Accession #:    [id number]       Weight:       178.1 lb Date of Birth:  1936-01-04       BSA:          1.862 m Patient Age:    51 years         BP:           146/70 mmHg Patient Gender: M                HR:           66 bpm. Exam Location:  [facility] Procedure: Limited Echo, Cardiac Doppler and Color Doppler Indications:    Abnormal EKG  History:        Patient has no prior history of Echocardiogram examinations. CHF                 and Cardiomyopathy, CAD and Previous Myocardial Infarction,                 Prior CABG, Signs/Symptoms:Shortness of Breath; Risk                 Factors:Hypertension and Dyslipidemia. COVID+, resp. failure.  Sonographer:    Dustin Flock RDCS Referring Phys: [place]  1. Left ventricular ejection fraction, by estimation, is 45 to 50%. The left ventricle has mildly decreased function. The left ventricle demonstrates regional wall motion abnormalities (see scoring diagram/findings for description). There is moderate left ventricular hypertrophy.  2. Right ventricular systolic function is normal. The right ventricular size is normal. There is normal pulmonary artery systolic pressure. The estimated right ventricular systolic  pressure is 81.4 mmHg.  3. The mitral valve is grossly normal. Mild mitral valve regurgitation.  4. The aortic valve is tricuspid. There is moderate calcification of the aortic valve. Aortic valve Vmax measures 1.79 m/s.  5. The inferior vena cava is normal in size with greater than 50% respiratory variability, suggesting right atrial pressure of 3 mmHg. FINDINGS  Left Ventricle: Left ventricular ejection fraction, by estimation, is 45 to 50%. The left ventricle has mildly decreased function. The left ventricle demonstrates regional wall motion abnormalities. The left ventricular internal cavity size was normal in size. There is moderate left ventricular hypertrophy.  LV Wall Scoring: The basal inferolateral segment and basal inferior segment are hypokinetic. Right Ventricle: The right ventricular size is normal. No increase in right ventricular wall thickness. Right ventricular systolic function is normal. There is normal pulmonary artery systolic pressure. The tricuspid regurgitant velocity is 2.44 m/s, and  with an assumed right atrial  pressure of 3 mmHg, the estimated right ventricular systolic pressure is 40.9 mmHg. Left Atrium: Left atrial size was normal in size. Right Atrium: Right atrial size was normal in size. Pericardium: There is no evidence of pericardial effusion. Mitral Valve: The mitral valve is grossly normal. Mild mitral annular calcification. Mild mitral valve regurgitation. Aortic Valve: The aortic valve is tricuspid. There is moderate calcification of the aortic valve. There is mild aortic valve annular calcification. Aortic valve peak gradient measures 12.8 mmHg. Pulmonic Valve: The pulmonic valve was grossly normal. Pulmonic valve regurgitation is trivial. Aorta: The aortic root is normal in size and structure. Venous: The inferior vena cava is normal in size with greater than 50% respiratory variability, suggesting right atrial pressure of 3 mmHg. IAS/Shunts: No atrial level shunt detected  by color flow Doppler. LEFT VENTRICLE PLAX 2D LVIDd:         4.69 cm  Diastology LVIDs:         3.89 cm  LV e' medial:    3.05 cm/s LV PW:         1.43 cm  LV E/e' medial:  23.3 LV IVS:        1.49 cm  LV e' lateral:   6.42 cm/s LVOT diam:     2.30 cm  LV E/e' lateral: 11.1 LVOT Area:     4.15 cm  LEFT ATRIUM         Index LA diam:    4.30 cm 2.31 cm/m  AORTIC VALVE AV Vmax:      179.00 cm/s AV Peak Grad: 12.8 mmHg  AORTA Ao Root diam: 3.40 cm MITRAL VALVE               TRICUSPID VALVE MV Area (PHT): 3.10 cm    TR Peak grad:   23.8 mmHg MV Decel Time: 245 msec    TR Vmax:        244.00 cm/s MV E velocity: 71.10 cm/s MV A velocity: 82.70 cm/s  SHUNTS MV E/A ratio:  0.86        Systemic Diam: 2.30 cm Rozann Lesches MD Electronically signed by Rozann Lesches MD Signature Date/Time: 08/16/2020/4:46:06 PM    Final      CBC Recent Labs  Lab 08/10/2020 1318 08/14/2020 1541 08/16/20 0219  WBC 15.7* 13.5* 10.4  HGB 13.2 13.0 12.8*  HCT 40.5 38.9* 37.7*  PLT 169 153 161  MCV 81.7 80.4 79.0*  MCH 26.6 26.9 26.8  MCHC 32.6 33.4 34.0  RDW 14.2 14.4 14.2  LYMPHSABS 0.5* 0.3* 0.3*  MONOABS 0.6 0.4 0.4  EOSABS 0.0 0.0 0.0  BASOSABS 0.0 0.0 0.0    Chemistries  Recent Labs  Lab 08/28/2020 1318 08/26/2020 1541 08/16/20 0219  NA 131*  --  136  K 4.0  --  3.5  CL 101  --  105  CO2 18*  --  20*  GLUCOSE 234*  --  152*  BUN 30*  --  32*  CREATININE 2.11*  --  1.86*  CALCIUM 8.1*  --  8.1*  MG  --  1.9 1.9  AST 31  --  28  ALT 17  --  17  ALKPHOS 71  --  67  BILITOT 1.4*  --  0.8   ------------------------------------------------------------------------------------------------------------------ Recent Labs    08/29/2020 1541  TRIG 134    No results found for: HGBA1C ------------------------------------------------------------------------------------------------------------------ No results for input(s): TSH, T4TOTAL, T3FREE, THYROIDAB in the last 72 hours.  Invalid input(s):  FREET3 ------------------------------------------------------------------------------------------------------------------  Recent Labs    09/06/2020 1541 08/16/20 0219  FERRITIN 867* 870*    Coagulation profile Recent Labs  Lab 08/13/2020 1318  INR 1.3*    Recent Labs    09/07/2020 1541 08/16/20 0219  DDIMER 0.64* 0.85*    Cardiac Enzymes No results for input(s): CKMB, TROPONINI, MYOGLOBIN in the last 168 hours.  Invalid input(s): CK ------------------------------------------------------------------------------------------------------------------    Component Value Date/Time   BNP 1,029.0 (H) 09/04/2020 1541   Roxan Hockey M.D on 08/16/2020 at 6:03 PM  Go to [email or web address] - for contact info  [facility] - Office  [phone]

## 2020-08-17 ENCOUNTER — Inpatient Hospital Stay (HOSPITAL_COMMUNITY): Payer: Medicare HMO

## 2020-08-17 LAB — COMPREHENSIVE METABOLIC PANEL
ALT: 17 U/L (ref 0–44)
AST: 29 U/L (ref 15–41)
Albumin: 2.3 g/dL — ABNORMAL LOW (ref 3.5–5.0)
Alkaline Phosphatase: 59 U/L (ref 38–126)
Anion gap: 12 (ref 5–15)
BUN: 46 mg/dL — ABNORMAL HIGH (ref 8–23)
CO2: 22 mmol/L (ref 22–32)
Calcium: 8.4 mg/dL — ABNORMAL LOW (ref 8.9–10.3)
Chloride: 106 mmol/L (ref 98–111)
Creatinine, Ser: 1.87 mg/dL — ABNORMAL HIGH (ref 0.61–1.24)
GFR, Estimated: 33 mL/min — ABNORMAL LOW (ref >60)
Glucose, Bld: 141 mg/dL — ABNORMAL HIGH (ref 70–99)
Potassium: 3.4 mmol/L — ABNORMAL LOW (ref 3.5–5.1)
Sodium: 140 mmol/L (ref 135–145)
Total Bilirubin: 1 mg/dL (ref 0.3–1.2)
Total Protein: 5.7 g/dL — ABNORMAL LOW (ref 6.5–8.1)

## 2020-08-17 LAB — GLUCOSE, CAPILLARY
Glucose-Capillary: 146 mg/dL — ABNORMAL HIGH (ref 70–99)
Glucose-Capillary: 131 mg/dL — ABNORMAL HIGH (ref 70–99)
Glucose-Capillary: 138 mg/dL — ABNORMAL HIGH (ref 70–99)
Glucose-Capillary: 147 mg/dL — ABNORMAL HIGH (ref 70–99)

## 2020-08-17 LAB — MAGNESIUM: Magnesium: 2.3 mg/dL (ref 1.7–2.4)

## 2020-08-17 LAB — CBC WITH DIFFERENTIAL/PLATELET
Abs Immature Granulocytes: 0.06 10*3/uL (ref 0.00–0.07)
Basophils Absolute: 0 10*3/uL (ref 0.0–0.1)
Basophils Relative: 0 %
Eosinophils Absolute: 0 10*3/uL (ref 0.0–0.5)
Eosinophils Relative: 0 %
HCT: 39.3 % (ref 39.0–52.0)
Hemoglobin: 13.2 g/dL (ref 13.0–17.0)
Immature Granulocytes: 1 %
Lymphocytes Relative: 4 %
Lymphs Abs: 0.3 10*3/uL — ABNORMAL LOW (ref 0.7–4.0)
MCH: 26.6 pg (ref 26.0–34.0)
MCHC: 33.6 g/dL (ref 30.0–36.0)
MCV: 79.1 fL — ABNORMAL LOW (ref 80.0–100.0)
Monocytes Absolute: 0.5 10*3/uL (ref 0.1–1.0)
Monocytes Relative: 6 %
Neutro Abs: 8.1 10*3/uL — ABNORMAL HIGH (ref 1.7–7.7)
Neutrophils Relative %: 89 %
Platelets: 257 10*3/uL (ref 150–400)
RBC: 4.97 MIL/uL (ref 4.22–5.81)
RDW: 14.6 % (ref 11.5–15.5)
WBC: 9 10*3/uL (ref 4.0–10.5)
nRBC: 0 % (ref 0.0–0.2)

## 2020-08-17 LAB — D-DIMER, QUANTITATIVE: D-Dimer, Quant: 0.58 ug/mL-FEU — ABNORMAL HIGH (ref 0.00–0.50)

## 2020-08-17 LAB — PHOSPHORUS: Phosphorus: 4.4 mg/dL (ref 2.5–4.6)

## 2020-08-17 LAB — BLOOD GAS, ARTERIAL
Acid-base deficit: 2.1 mmol/L — ABNORMAL HIGH (ref 0.0–2.0)
Bicarbonate: 22.8 mmol/L (ref 20.0–28.0)
FIO2: 100
O2 Saturation: 87.8 %
Patient temperature: 36.6
pCO2 arterial: 33.5 mmHg (ref 32.0–48.0)
pH, Arterial: 7.426 (ref 7.350–7.450)
pO2, Arterial: 55.3 mmHg — ABNORMAL LOW (ref 83.0–108.0)

## 2020-08-17 LAB — HEPARIN LEVEL (UNFRACTIONATED): Heparin Unfractionated: 0.55 IU/mL (ref 0.30–0.70)

## 2020-08-17 LAB — TROPONIN I (HIGH SENSITIVITY): Troponin I (High Sensitivity): 412 ng/L (ref <18)

## 2020-08-17 LAB — C-REACTIVE PROTEIN: CRP: 18.7 mg/dL — ABNORMAL HIGH (ref <1.0)

## 2020-08-17 LAB — FERRITIN: Ferritin: 1094 ng/mL — ABNORMAL HIGH (ref 24–336)

## 2020-08-17 MED ORDER — FENTANYL CITRATE (PF) 100 MCG/2ML IJ SOLN
INTRAMUSCULAR | Status: AC
  Administered 2020-08-17: 25 ug via INTRAVENOUS
  Filled 2020-08-17: qty 2

## 2020-08-17 MED ORDER — FENTANYL CITRATE (PF) 100 MCG/2ML IJ SOLN
25.0000 ug | INTRAMUSCULAR | Status: DC | PRN
  Administered 2020-08-17 – 2020-08-20 (×7): 25 ug via INTRAVENOUS
  Filled 2020-08-17 (×7): qty 2

## 2020-08-17 MED ORDER — FENTANYL CITRATE (PF) 100 MCG/2ML IJ SOLN: 25.0000 ug | INTRAMUSCULAR | Status: DC | PRN

## 2020-08-17 MED ORDER — HALOPERIDOL LACTATE 5 MG/ML IJ SOLN
5.0000 mg | Freq: Four times a day (QID) | INTRAMUSCULAR | Status: DC | PRN
  Administered 2020-08-20: 5 mg via INTRAMUSCULAR
  Filled 2020-08-17: qty 1

## 2020-08-17 NOTE — Progress Notes (Signed)
Off BiPAP on HFNC 40/100, NRB mask.

## 2020-08-17 NOTE — Progress Notes (Signed)
Patient has been on and off numerous times BiPAP through vent. He does well on but does not keep on. Nurse placed on heated high flow and NRB mask.

## 2020-08-17 NOTE — Progress Notes (Signed)
Patient Demographics:    [name] [name], is a 84 y.o. male, DOB - 1936/10/13, ELF:[id number]  Admit date - 09/08/2020   Admitting Physician Ejiroghene Arlyce Dice, MD  Outpatient Primary MD for the patient is [name], MD  LOS - 2   Chief Complaint  Patient presents with  . Shortness of Breath        Subjective:    [name] [name] today has persistent cough and shortness of breath with persistent hypoxia  -Patient had a pretty rough night from a respiratory standpoint mostly due to lack of compliance with BiPAP-- -- After further conversations with patient and his daughter who is a respiratory therapist today patient is more compliant with BiPAP--- we have been  giving fentanyl as needed to allow him to keep the BiPAP on - Patient oxygen saturations are usually okay as long as he is on BiPAP, off BiPAP he does not do as well-- -ABG done on BiPAP with 100% FiO2 is reassuring   Assessment  & Plan :    Principal Problem:   Pneumonia due to COVID-19 virus Active Problems:   Acute respiratory failure with hypoxemia (HCC)   Acute respiratory failure with hypoxia ([place])   Essential hypertension, benign   Acute renal failure superimposed on stage 3 chronic kidney disease ([place])   CHF (congestive heart failure) ([place])   Protein calorie malnutrition ([place])   NSTEMI (non-ST elevated myocardial infarction) [facility])  Brief Summary:- 84 y.o. male with medical history significant for CABG, hypertension, ischemic cardiomyopathy, BPH admitted 08/16/2020 with severe Acute Hypoxic Resp Failure due to Covid Pneumonia and found to have elevated troponins with echocardiogram showing wall motion abnormalities and EF of 45 to 50%   A/p 1)Acute hypoxic respiratory failure secondary to Covid pneumonia--- -patient previously vaccinated Dxed 08/14/20 -Severe persistent hypoxia--- alternating between BiPAP and heated  high flow oxygen with FiO2 of 100% and 50 L -VQ scan negative for PE -Lower extremity Dopplers requested -Continue IV steroids and remdesivir started on 08/31/2020 -Actemra was ordered on 08/16/2028 but not given to 08/17/2028 -Given persistent severe hypoxia discussed with patient and daughter regarding Actemra initiation The patient has hypoxia and is high-risk for intubation, but expected to survive >48 hours and has good baseline functional status.  She is not known to be on immunomodulators, anti-rejection medications, or cancer chemotherapy, has no history of TB or latent TB, and no history of diverticulitis or intestinal perforation.  Platelets are >50K, ANC is >500, and ALT/AST are below 5x ULN with no known hepatitis B infection. Baricitinib is being used under EUA by the FDA. The patient has no ESRD or AKI, known history of TB, severe neutropenia (ANC <500) or lymphopenia, or severe LFT elevations. They are not on DMARDs or probenecid, and are not pregnant. The option to use/refuse baricitinib treatment under FDA authorization (not approval), the significant known and potential risks and benefits, the extent to which these are unknown, and information regarding all available alternatives were discussed in detail. Specifically the risk of VTE and secondary infections were discussed in detail with the patient and/or HCPOA. They consent to proceed with treatment. - Continue Baricitinib/Actemra, c/n 14 days or until hospital discharge. Monitor Cr, LFTs, differential. Keep on VTE ppx COVID-19 Labs  Recent Labs  08/11/2020 1541 08/16/20 0219 08/17/20 0551  DDIMER 0.64* 0.85* 0.58*  FERRITIN 867* 870* 1,094*  LDH 352*  --   --   CRP 29.0* 26.9* 18.7*    Lab Results  Component Value Date   SARSCOV2NAA POSITIVE (A) 09/08/2020   SARSCOV2NAA Detected (A) 08/14/2020   [place] Not Detected 10/19/2019   -Encourage prone positioning for More than 16 hours/day in increments of 2 to 3 hours at a  time if able to tolerate --Attempt to maintain euvolemic state --Zinc and vitamin C as ordered -Albuterol inhaler as needed -Accu-Cheks/fingersticks while on high-dose steroids -PPI while on high-dose steroids -Enhanced dosage of anticoagulant for DVT prophylaxis given hypercoagulable state with COVID-19 infections   2)NSTEMI-elevated troponins noted, suspect this is related to severe hypoxia in the setting of Covid 19 pneumonia with acute hypoxic respiratory failure ----echo from 08/16/2020 with EF of 45 to 50% which is similar to prior EF based on gated images from April 2019 Echo from 08/16/2020 showed wall motion abnormalities-The basal inferolateral segment and basal inferior segment are hypokinetic -Continue IV heparin at least for 48 hours, possibly 72 hrs -Aspirin, metoprolol and Lipitor ordered -Patient remains chest pain-free Troponin--- 384>>439>>658>>716>>684>412 -He is very hypoxic currently requiring -Requiring mostly continuous BiPAP alternating with heated high flow oxygen at 100% FiO2 with 50 L in addition to a nonrebreather bag -QT segment was prolonged on admission--monitor mag and potassium closely  3) possible concomitant community-acquired bacterial pneumonia--- patient with leukocytosis and elevated procalcitonin at 1.85 noted -Continue Rocephin and azithromycin -Strep and Legionella antigen requested  4)HFrEF--patient with history of systolic dysfunction CHF/ischemic cardiomyopathy--- repeat echo with EF of 45 to 50% with wall motion abnormalities -Please see #2 above  5)AKI----acute kidney injury on CKD stage - IIIb   --- due to decreased p.o. intake in the setting of Covid infection -creatinine on admission= 2.11  , baseline creatinine = 1.3 to 1.4 (03/07/20)    , creatinine is now=1.87  ,  -Patient has only one kidney --renally adjust medications, avoid nephrotoxic agents / dehydration  / hypotension  6)Social/Ethics--- plan of care discussed with patient and  daughter, patient is full code with full scope of treatment -However patient and patient's daughter would like to hold off on intubation for as long as possible and try all other needs including BiPAP prior to considering intubation if patient does not improve -Patient's wife just passed away from COVID-19 infection on 08/08/20 -Risk versus benefits of Actemra units discussed with patient and daughter consent obtained for administration of Actemra   Disposition/Need for in-Hospital Stay- patient unable to be discharged at this time due to --severe hypoxic respiratory failure secondary to COVID-19 infection/pneumonia requiring IV remdesivir, IV steroids and high flow supplemental oxygen on BiPAP, as well as NSTEMI requiring IV heparin  Status is: Inpatient  Remains inpatient appropriate because:severe hypoxic respiratory failure secondary to COVID-19 infection/pneumonia requiring IV remdesivir, IV steroids and high flow supplemental oxygen on BiPAP, as well as NSTEMI requiring IV heparin   Disposition: The patient is from: Home              Anticipated d/c is to: Home              Anticipated d/c date is: > 3 days              Patient currently is not medically stable to d/c. Barriers: Not Clinically Stable- severe hypoxic respiratory failure secondary to COVID-19 infection/pneumonia requiring IV remdesivir, IV steroids and high flow supplemental  oxygen on BiPAP, as well as NSTEMI requiring IV heparin  Code Status : Full Code  Family Communication:     (patient is alert, awake and coherent)  -Discussed with patient daughter [name] who is a respiratory therapist  Consults  :  PCCM  DVT Prophylaxis  : IV heparin SCDs   Lab Results  Component Value Date   PLT 257 08/17/2020    Inpatient Medications  Scheduled Meds: . albuterol  2 puff Inhalation Q6H  . vitamin C  500 mg Oral Daily  . aspirin EC  81 mg Oral Q breakfast  . atorvastatin  40 mg Oral Daily  . Chlorhexidine Gluconate  Cloth  6 each Topical Daily  . feeding supplement (GLUCERNA SHAKE)  237 mL Oral TID BM  . furosemide  40 mg Intravenous Q12H  . insulin aspart  0-5 Units Subcutaneous QHS  . insulin aspart  0-9 Units Subcutaneous TID WC  . methylPREDNISolone (SOLU-MEDROL) injection  40 mg Intravenous Q8H  . metoprolol tartrate  25 mg Oral BID  . pantoprazole (PROTONIX) IV  40 mg Intravenous Q24H  . zinc sulfate  220 mg Oral Daily   Continuous Infusions: . azithromycin 500 mg (08/16/20 1850)  . cefTRIAXone (ROCEPHIN)  IV 1 g (08/17/20 1330)  . heparin 1,000 Units/hr (08/17/20 0013)  . remdesivir 100 mg in NS 100 mL 100 mg (08/17/20 0945)   PRN Meds:.acetaminophen, cloNIDine, fentaNYL (SUBLIMAZE) injection, guaiFENesin-dextromethorphan, haloperidol lactate, ondansetron **OR** ondansetron (ZOFRAN) IV, polyethylene glycol    Anti-infectives (From admission, onward)   Start     Dose/Rate Route Frequency Ordered Stop   08/16/20 1730  azithromycin (ZITHROMAX) 500 mg in sodium chloride 0.9 % 250 mL IVPB        500 mg 250 mL/hr over 60 Minutes Intravenous Every 24 hours 08/16/20 1728     08/16/20 1300  cefTRIAXone (ROCEPHIN) 1 g in sodium chloride 0.9 % 100 mL IVPB        1 g 200 mL/hr over 30 Minutes Intravenous Every 24 hours 08/17/2020 2055     08/16/20 1000  remdesivir 100 mg in sodium chloride 0.9 % 100 mL IVPB        100 mg 200 mL/hr over 30 Minutes Intravenous Daily 08/31/2020 1728 09/01/20 0959   08/16/20 0100  doxycycline (VIBRAMYCIN) 100 mg in sodium chloride 0.9 % 250 mL IVPB  Status:  Discontinued        100 mg 125 mL/hr over 120 Minutes Intravenous Every 12 hours 09/06/2020 2055 08/16/20 1728   08/14/2020 1730  remdesivir 100 mg in sodium chloride 0.9 % 100 mL IVPB        100 mg 200 mL/hr over 30 Minutes Intravenous Every 1 hr x 2 08/22/2020 1728 09/05/2020 2008   08/10/2020 1330  levofloxacin (LEVAQUIN) IVPB 750 mg        750 mg 100 mL/hr over 90 Minutes Intravenous  Once 08/30/2020 1318 09/03/2020 1418          Objective:   Vitals:   08/17/20 1000 08/17/20 1100 08/17/20 1125 08/17/20 1425  BP: (!) 129/56 (!) 142/55    Pulse: 63 64  62  Resp: (!) 35 (!) 34  (!) 34  Temp:      TempSrc:      SpO2: 92% 90% 94% 92%  Weight:      Height:        Wt Readings from Last 3 Encounters:  08/17/20 81.5 kg  07/10/20 81.6 kg  05/07/20 81.7 kg  Intake/Output Summary (Last 24 hours) at 08/17/2020 1542 Last data filed at 08/17/2020 0938 Gross per 24 hour  Intake 1203.03 ml  Output 900 ml  Net 303.03 ml    Physical Exam  Gen:- Awake Alert, conversational dyspnea HEENT:- [place].AT, No sclera icterus Nose- Bipap Mask alternating with HFNC Neck-Supple Neck,No JVD,.  Lungs-diminished breath sounds with scattered rhonchi CV- S1, S2 normal, regular  Abd-  +ve B.Sounds, Abd Soft, No tenderness,    Extremity/Skin:-Trace to +1 edema, pedal pulses present  Psych-affect is anxious at times, oriented x3 Neuro-generalized weakness, no new focal deficits, no tremors   Data Review:   Micro Results Recent Results (from the past 240 hour(s))  Novel Coronavirus, NAA ([facility])     Status: Abnormal   Collection Time: 08/14/20  3:50 PM   Specimen: Nasopharyngeal(NP) swabs in vial transport medium  Result Value Ref Range Status   SARS-CoV-2, NAA Detected (A) Not Detected Final    Comment: Patients who have a positive COVID-19 test result may now have treatment options. Treatment options are available for patients with mild to moderate symptoms and for hospitalized patients. Visit our website at [email or web address] for resources and information. This nucleic acid amplification test was developed and its performance characteristics determined by [employer]. Nucleic acid amplification tests include RT-PCR and TMA. This test has not been FDA cleared or approved. This test has been authorized by FDA under an Emergency Use Authorization (EUA). This test is only authorized for the  duration of time the declaration that circumstances exist justifying the authorization of the emergency use of in vitro diagnostic tests for detection of SARS-CoV-2 virus and/or diagnosis of COVID-19 infection under section 564(b)(1) of the Act, 21 U.S.C. 242PNT-6(R) (1), unless the authorization is terminated or revoked sooner. When diagnostic testing is negativ e, the possibility of a false negative result should be considered in the context of a patient's recent exposures and the presence of clinical signs and symptoms consistent with COVID-19. An individual without symptoms of COVID-19 and who is not shedding SARS-CoV-2 virus would expect to have a negative (not detected) result in this assay.   SARS-COV-2, NAA 2 DAY TAT     Status: None   Collection Time: 08/14/20  3:50 PM  Result Value Ref Range Status   SARS-CoV-2, NAA 2 DAY TAT Performed  Final  Resp Panel by RT PCR (RSV, Flu A&B, Covid) - Nasopharyngeal Swab     Status: Abnormal   Collection Time: 08/19/2020  1:13 PM   Specimen: Nasopharyngeal Swab  Result Value Ref Range Status   SARS Coronavirus 2 by RT PCR POSITIVE (A) NEGATIVE Final    Comment: RESULT CALLED TO, READ BACK BY AND VERIFIED WITH: DR. ZAMMIT AT [id number] ON [id number] BY THOMPSON S. (NOTE) SARS-CoV-2 target nucleic acids are DETECTED.  SARS-CoV-2 RNA is generally detectable in upper respiratory specimens  during the acute phase of infection. Positive results are indicative of the presence of the identified virus, but do not rule out bacterial infection or co-infection with other pathogens not detected by the test. Clinical correlation with patient history and other diagnostic information is necessary to determine patient infection status. The expected result is Negative.  Fact Sheet for Patients:  PinkCheek.be  Fact Sheet for Healthcare Providers: GravelBags.it  This test is not yet approved or cleared by  the [place] FDA and  has been authorized for detection and/or diagnosis of SARS-CoV-2 by FDA under an Emergency Use Authorization (EUA).  This EUA will remain in effect (meaning  this tes t can be used) for the duration of  the COVID-19 declaration under Section 564(b)(1) of the Act, 21 U.S.C. section 360bbb-3(b)(1), unless the authorization is terminated or revoked sooner.      Influenza A by PCR NEGATIVE NEGATIVE Final   Influenza B by PCR NEGATIVE NEGATIVE Final    Comment: (NOTE) The Xpert Xpress SARS-CoV-2/FLU/RSV assay is intended as an aid in  the diagnosis of influenza from Nasopharyngeal swab specimens and  should not be used as a sole basis for treatment. Nasal washings and  aspirates are unacceptable for Xpert Xpress SARS-CoV-2/FLU/RSV  testing.  Fact Sheet for Patients: PinkCheek.be  Fact Sheet for Healthcare Providers: GravelBags.it  This test is not yet approved or cleared by the [place] FDA and  has been authorized for detection and/or diagnosis of SARS-CoV-2 by  FDA under an Emergency Use Authorization (EUA). This EUA will remain  in effect (meaning this test can be used) for the duration of the  Covid-19 declaration under Section 564(b)(1) of the Act, 21  U.S.C. section 360bbb-3(b)(1), unless the authorization is  terminated or revoked.    Respiratory Syncytial Virus by PCR NEGATIVE NEGATIVE Final    Comment: (NOTE) Fact Sheet for Patients: PinkCheek.be  Fact Sheet for Healthcare Providers: GravelBags.it  This test is not yet approved or cleared by the [place] FDA and  has been authorized for detection and/or diagnosis of SARS-CoV-2 by  FDA under an Emergency Use Authorization (EUA). This EUA will remain  in effect (meaning this test can be used) for the duration of the  COVID-19 declaration under Section 564(b)(1) of the Act,  21 U.S.C.  section 360bbb-3(b)(1), unless the authorization is terminated or  revoked. Performed at [facility], [address] [place], [place] [address]   Urine culture     Status: Abnormal (Preliminary result)   Collection Time: 08/17/2020  1:18 PM   Specimen: In/Out Cath Urine  Result Value Ref Range Status   Specimen Description   Final    IN/OUT CATH URINE Performed at [facility], [address] [place], [place] [address]    Special Requests   Final    NONE Performed at [facility], [address] [place], [place] [address]    Culture (A)  Final    30,000 COLONIES/mL STAPHYLOCOCCUS AUREUS SUSCEPTIBILITIES TO FOLLOW Performed at [facility], [place] [address] [place],  [address]    Report Status PENDING  Incomplete  Blood Culture (routine x 2)     Status: None (Preliminary result)   Collection Time: 08/30/2020  1:42 PM   Specimen: BLOOD LEFT HAND  Result Value Ref Range Status   Specimen Description BLOOD LEFT HAND  Final   Special Requests   Final    BOTTLES DRAWN AEROBIC AND ANAEROBIC Blood Culture adequate volume   Culture   Final    NO GROWTH 2 DAYS Performed at [facility], [address] [place],  [address]    Report Status PENDING  Incomplete  Blood Culture (routine x 2)     Status: None (Preliminary result)   Collection Time: 08/10/2020  1:42 PM   Specimen: BLOOD LEFT ARM  Result Value Ref Range Status   Specimen Description BLOOD LEFT ARM  Final   Special Requests   Final    BOTTLES DRAWN AEROBIC ONLY Blood Culture results may not be optimal due to an inadequate volume of blood received in culture bottles   Culture   Final    NO  GROWTH 2 DAYS Performed at [facility], [address], [place], [place] [address]    Report Status PENDING  Incomplete  MRSA PCR Screening     Status: None   Collection Time: 09/03/2020  7:55 PM   Specimen: Nasal Mucosa; Nasopharyngeal  Result Value Ref Range Status   MRSA by PCR NEGATIVE NEGATIVE Final     Comment:        The GeneXpert MRSA Assay (FDA approved for NASAL specimens only), is one component of a comprehensive MRSA colonization surveillance program. It is not intended to diagnose MRSA infection nor to guide or monitor treatment for MRSA infections. Performed at [facility], [address], [place],  [address]     Radiology Reports NM Pulmonary Perf and Vent  Result Date: 08/16/2020 CLINICAL DATA:  Elevated D-dimer, COVID-19 positive EXAM: NUCLEAR MEDICINE PERFUSION LUNG SCAN TECHNIQUE: Perfusion images were obtained in multiple projections after intravenous injection of radiopharmaceutical. Ventilation scans intentionally deferred if perfusion scan and chest x-ray adequate for interpretation during COVID 19 epidemic. RADIOPHARMACEUTICALS:  4 mCi Tc-43m MAA IV COMPARISON:  None Correlation: Chest radiograph 08/19/2020 FINDINGS: Slightly diminished perfusion diffusely in LEFT lung versus RIGHT. Perfusion is much better than expected versus LEFT lung infiltrates seen on chest radiograph. No segmental or subsegmental perfusion defects. IMPRESSION: No scintigraphic evidence of pulmonary embolism. Electronically Signed   By: Lavonia Dana M.D.   On: 08/16/2020 14:35   DG CHEST PORT 1 VIEW  Result Date: 08/17/2020 CLINICAL DATA:  Dyspnea and respiratory abnormalities, COVID-19 positive, hypertension CHF EXAM: PORTABLE CHEST 1 VIEW COMPARISON:  Radiograph 09/07/2020 FINDINGS: Lung volumes appear slightly improved. There are persistent heterogeneous airspace opacities in both lungs as well as fissural and septal thickening and possible trace left effusion. Postsurgical changes from prior sternotomy and CABG with enlarged cardiac silhouette, similar to comparison and possibly reflecting some cardiomegaly or pericardial effusion. The aorta is calcified. The remaining cardiomediastinal contours are unremarkable. No acute osseous or soft tissue abnormality. Degenerative changes are present in  the imaged spine and shoulders. Telemetry leads overlie the chest. IMPRESSION: 1. Slightly improved lung volumes. 2. Persistent bilateral heterogeneous airspace opacities and fissural thickening, compatible with multifocal infection in the setting of COVID-19 though some mild edema may be present as well with trace left effusion. 3. Enlarged cardiac silhouette, cardiomegaly versus pericardial effusion. 4.  Aortic Atherosclerosis (ICD10-I70.0). Electronically Signed   By: Lovena Le M.D.   On: 08/17/2020 15:13   DG Chest Port 1 View  Result Date: 09/02/2020 CLINICAL DATA:  Shortness of breath, oxygen desaturation EXAM: PORTABLE CHEST 1 VIEW COMPARISON:  Portable exam 1320 hours without priors for comparison FINDINGS: Lordotic positioning limits exam. Enlargement of cardiac silhouette with slight vascular congestion post CABG. Mediastinal contours normal. Subsegmental atelectasis RIGHT base. Hazy infiltrates in LEFT lung which could represent infection or less likely asymmetric edema. New no pleural effusion or pneumothorax. Bones demineralized. IMPRESSION: Hazy infiltrates in LEFT lung question pneumonia less likely asymmetric edema. Enlargement of cardiac silhouette with pulmonary vascular congestion. Atelectasis at RIGHT base. Electronically Signed   By: Lavonia Dana M.D.   On: 08/14/2020 13:54   ECHOCARDIOGRAM LIMITED  Result Date: 08/16/2020    ECHOCARDIOGRAM LIMITED REPORT   Patient Name:   EJAY LASHLEY Date of Exam: 08/16/2020 Medical Rec #:  [id number]        Height:       64.0 in Accession #:    [id number]       Weight:  178.1 lb Date of Birth:  Mar 19, 1936       BSA:          1.862 m Patient Age:    73 years         BP:           146/70 mmHg Patient Gender: M                HR:           66 bpm. Exam Location:  [facility] Procedure: Limited Echo, Cardiac Doppler and Color Doppler Indications:    Abnormal EKG  History:        Patient has no prior history of Echocardiogram examinations. CHF                  and Cardiomyopathy, CAD and Previous Myocardial Infarction,                 Prior CABG, Signs/Symptoms:Shortness of Breath; Risk                 Factors:Hypertension and Dyslipidemia. COVID+, resp. failure.  Sonographer:    Dustin Flock RDCS Referring Phys: [place]  1. Left ventricular ejection fraction, by estimation, is 45 to 50%. The left ventricle has mildly decreased function. The left ventricle demonstrates regional wall motion abnormalities (see scoring diagram/findings for description). There is moderate left ventricular hypertrophy.  2. Right ventricular systolic function is normal. The right ventricular size is normal. There is normal pulmonary artery systolic pressure. The estimated right ventricular systolic pressure is 54.6 mmHg.  3. The mitral valve is grossly normal. Mild mitral valve regurgitation.  4. The aortic valve is tricuspid. There is moderate calcification of the aortic valve. Aortic valve Vmax measures 1.79 m/s.  5. The inferior vena cava is normal in size with greater than 50% respiratory variability, suggesting right atrial pressure of 3 mmHg. FINDINGS  Left Ventricle: Left ventricular ejection fraction, by estimation, is 45 to 50%. The left ventricle has mildly decreased function. The left ventricle demonstrates regional wall motion abnormalities. The left ventricular internal cavity size was normal in size. There is moderate left ventricular hypertrophy.  LV Wall Scoring: The basal inferolateral segment and basal inferior segment are hypokinetic. Right Ventricle: The right ventricular size is normal. No increase in right ventricular wall thickness. Right ventricular systolic function is normal. There is normal pulmonary artery systolic pressure. The tricuspid regurgitant velocity is 2.44 m/s, and  with an assumed right atrial pressure of 3 mmHg, the estimated right ventricular systolic pressure is 27.0 mmHg. Left Atrium: Left atrial size was  normal in size. Right Atrium: Right atrial size was normal in size. Pericardium: There is no evidence of pericardial effusion. Mitral Valve: The mitral valve is grossly normal. Mild mitral annular calcification. Mild mitral valve regurgitation. Aortic Valve: The aortic valve is tricuspid. There is moderate calcification of the aortic valve. There is mild aortic valve annular calcification. Aortic valve peak gradient measures 12.8 mmHg. Pulmonic Valve: The pulmonic valve was grossly normal. Pulmonic valve regurgitation is trivial. Aorta: The aortic root is normal in size and structure. Venous: The inferior vena cava is normal in size with greater than 50% respiratory variability, suggesting right atrial pressure of 3 mmHg. IAS/Shunts: No atrial level shunt detected by color flow Doppler. LEFT VENTRICLE PLAX 2D LVIDd:         4.69 cm  Diastology LVIDs:         3.89 cm  LV e' medial:  3.05 cm/s LV PW:         1.43 cm  LV E/e' medial:  23.3 LV IVS:        1.49 cm  LV e' lateral:   6.42 cm/s LVOT diam:     2.30 cm  LV E/e' lateral: 11.1 LVOT Area:     4.15 cm  LEFT ATRIUM         Index LA diam:    4.30 cm 2.31 cm/m  AORTIC VALVE AV Vmax:      179.00 cm/s AV Peak Grad: 12.8 mmHg  AORTA Ao Root diam: 3.40 cm MITRAL VALVE               TRICUSPID VALVE MV Area (PHT): 3.10 cm    TR Peak grad:   23.8 mmHg MV Decel Time: 245 msec    TR Vmax:        244.00 cm/s MV E velocity: 71.10 cm/s MV A velocity: 82.70 cm/s  SHUNTS MV E/A ratio:  0.86        Systemic Diam: 2.30 cm Rozann Lesches MD Electronically signed by Rozann Lesches MD Signature Date/Time: 08/16/2020/4:46:06 PM    Final      CBC Recent Labs  Lab 08/18/2020 1318 08/16/2020 1541 08/16/20 0219 08/17/20 0551  WBC 15.7* 13.5* 10.4 9.0  HGB 13.2 13.0 12.8* 13.2  HCT 40.5 38.9* 37.7* 39.3  PLT 169 153 161 257  MCV 81.7 80.4 79.0* 79.1*  MCH 26.6 26.9 26.8 26.6  MCHC 32.6 33.4 34.0 33.6  RDW 14.2 14.4 14.2 14.6  LYMPHSABS 0.5* 0.3* 0.3* 0.3*  MONOABS 0.6  0.4 0.4 0.5  EOSABS 0.0 0.0 0.0 0.0  BASOSABS 0.0 0.0 0.0 0.0    Chemistries  Recent Labs  Lab 09/06/2020 1318 09/01/2020 1541 08/16/20 0219 08/17/20 0551  NA 131*  --  136 140  K 4.0  --  3.5 3.4*  CL 101  --  105 106  CO2 18*  --  20* 22  GLUCOSE 234*  --  152* 141*  BUN 30*  --  32* 46*  CREATININE 2.11*  --  1.86* 1.87*  CALCIUM 8.1*  --  8.1* 8.4*  MG  --  1.9 1.9 2.3  AST 31  --  28 29  ALT 17  --  17 17  ALKPHOS 71  --  67 59  BILITOT 1.4*  --  0.8 1.0   ------------------------------------------------------------------------------------------------------------------ Recent Labs    08/10/2020 1541  TRIG 134    No results found for: HGBA1C ------------------------------------------------------------------------------------------------------------------ No results for input(s): TSH, T4TOTAL, T3FREE, THYROIDAB in the last 72 hours.  Invalid input(s): FREET3 ------------------------------------------------------------------------------------------------------------------ Recent Labs    08/16/20 0219 08/17/20 0551  FERRITIN 870* 1,094*    Coagulation profile Recent Labs  Lab 09/03/2020 1318  INR 1.3*    Recent Labs    08/16/20 0219 08/17/20 0551  DDIMER 0.85* 0.58*    Cardiac Enzymes No results for input(s): CKMB, TROPONINI, MYOGLOBIN in the last 168 hours.  Invalid input(s): CK ------------------------------------------------------------------------------------------------------------------    Component Value Date/Time   BNP 1,029.0 (H) 08/26/2020 1541   Roxan Hockey M.D on 08/17/2020 at 3:42 PM  Go to [email or web address] - for contact info  [facility] - Office  [phone]

## 2020-08-17 NOTE — Progress Notes (Signed)
Pt wore bipap for one hour before ripping it off again and refusing to put it back on.

## 2020-08-17 NOTE — Progress Notes (Signed)
CRITICAL VALUE ALERT  Critical Value:  Trop: 412  Date & Time Notied:  0808  Provider Notified: Dr. Denton Brick

## 2020-08-17 NOTE — Progress Notes (Signed)
Patient beard has been shaved. Hopefully this will allow BiPAP mask not to be placed on so tight and patient will be more compliant.

## 2020-08-17 NOTE — Progress Notes (Signed)
Pt was sating 70's to 80's upon arrival to shift. Pt on max heated high flow, Fio2 and nonrebreather. Pt did not want to be placed on bipap but after explaining the importance of it he agreed to wear it. Pt asked for something to help him rest. Paged midlevel MD received order for trazodone. Pt took trazodone. Pt has continuously pulled off bipap stating it is uncomfortable he does not want to wear it. Pt agreed to try something to help him rest more since when he pulls his bipap off he desats to the 50's. Paged Mid level MD again and received an order for 50mg  of Vistaril IM. Medication administered pt still refusing to relax and pulling off equipment. Pt was educated about the seriousness of his condition and with an 02 sat of 75 and RR 42 it was a possibility pt might end up on mechanical ventilation. This RN explained in detail what mechanical ventilation is. Pt said no he would not want mechanical intubation. Pt also refusing to prone. Paged MD with this information.

## 2020-08-17 NOTE — Progress Notes (Signed)
ANTICOAGULATION CONSULT NOTE - Initial Consult  Pharmacy Consult for Heparin Indication: chest pain/ACS  Allergies  Allergen Reactions  . Amoxicillin     Other reaction(s): Unknown pt can take cephalosporin.    Patient Measurements: Height: 5\' 4"  (162.6 cm) Weight: 81.5 kg (179 lb 10.8 oz) IBW/kg (Calculated) : 59.2 Heparin Dosing Weight: 75 kg  Vital Signs: Temp: 99.2 F (37.3 C) (10/09 0800) Temp Source: Axillary (10/09 0800) BP: 132/64 (10/09 0600) Pulse Rate: 69 (10/09 0913)  Labs: Recent Labs    08/17/2020 1318 08/11/2020 1318 08/14/2020 1541 09/04/2020 1757 08/16/20 0219 08/16/20 0219 08/16/20 0456 08/16/20 0640 08/16/20 1524 08/17/20 0551  HGB 13.2   < > 13.0   < > 12.8*  --   --   --   --  13.2  HCT 40.5   < > 38.9*  --  37.7*  --   --   --   --  39.3  PLT 169   < > 153  --  161  --   --   --   --  257  APTT 33  --   --   --   --   --   --   --   --   --   LABPROT 15.7*  --   --   --   --   --   --   --   --   --   INR 1.3*  --   --   --   --   --   --   --   --   --   HEPARINUNFRC  --   --   --   --   --   --   --   --  0.60 0.55  CREATININE 2.11*  --   --   --  1.86*  --   --   --   --  1.87*  TROPONINIHS  --   --  327*   < > 658*   < > 716* 684*  --  412*   < > = values in this interval not displayed.    Estimated Creatinine Clearance: 29.8 mL/min (A) (by C-G formula based on SCr of 1.87 mg/dL (H)).   Medical History: Past Medical History:  Diagnosis Date  . Arthritis   . CHF (congestive heart failure) ([place])   . Chronic constipation   . Coronary atherosclerosis of native coronary artery    a. s/p CABG in 2002 b. low-risk NST in 2013  . Diverticulosis 2007   Colonoscopy  . Essential hypertension   . Fatty liver disease, nonalcoholic   . GERD (gastroesophageal reflux disease)   . Headache(784.0)   . Hiatal hernia   . Ischemic cardiomyopathy    a. LVEF previously reduced to 35% with normalization since by review of prior notes  . Mixed  hyperlipidemia   . Myocardial infarction ([place])   . Nephrolithiasis   . Renal insufficiency   . Schatzki's ring    Last EGD/ED Dr Paul Half     Assessment: 84 y.o. male with COVID PNA and CHF, elevated troponins, for heparin.  On Heparin 5000 units SQ q8h for DVT prophylaxis. with last dose at 0440.  08/17/20 1100 update         Heparin level:0.55 IU/mL--> within therapeutic goal range on heparin at 1000 units/hr CBC:  Hb  13.2 (stable)   Plates 163>257 RN reports no s/s of bleeding or issues with infusion site  Goal of  Therapy:  Heparin level 0.3-0.7 units/ml Monitor platelets by anticoagulation protocol: Yes   Plan:  Continue heparin infusion rate at 1000 units/hr Check heparin level and CBC daily Monitor for s/s of bleeding.   Despina Pole 08/17/2020,10:57 AM

## 2020-08-17 NOTE — Progress Notes (Signed)
Patient does well on BiPAP except for his beard and his pulling at mask set off leak alarms on vent. His saturation will climb to 97.

## 2020-08-18 ENCOUNTER — Inpatient Hospital Stay (HOSPITAL_COMMUNITY): Payer: Medicare HMO

## 2020-08-18 LAB — CBC WITH DIFFERENTIAL/PLATELET
Abs Immature Granulocytes: 0.17 10*3/uL — ABNORMAL HIGH (ref 0.00–0.07)
Basophils Absolute: 0 10*3/uL (ref 0.0–0.1)
Basophils Relative: 0 %
Eosinophils Absolute: 0.2 10*3/uL (ref 0.0–0.5)
Eosinophils Relative: 2 %
HCT: 39.9 % (ref 39.0–52.0)
Hemoglobin: 13.5 g/dL (ref 13.0–17.0)
Immature Granulocytes: 2 %
Lymphocytes Relative: 4 %
Lymphs Abs: 0.4 10*3/uL — ABNORMAL LOW (ref 0.7–4.0)
MCH: 26.8 pg (ref 26.0–34.0)
MCHC: 33.8 g/dL (ref 30.0–36.0)
MCV: 79.2 fL — ABNORMAL LOW (ref 80.0–100.0)
Monocytes Absolute: 0.7 10*3/uL (ref 0.1–1.0)
Monocytes Relative: 7 %
Neutro Abs: 7.7 10*3/uL (ref 1.7–7.7)
Neutrophils Relative %: 85 %
Platelets: 273 10*3/uL (ref 150–400)
RBC: 5.04 MIL/uL (ref 4.22–5.81)
RDW: 15.1 % (ref 11.5–15.5)
WBC: 9.1 10*3/uL (ref 4.0–10.5)
nRBC: 0 % (ref 0.0–0.2)

## 2020-08-18 LAB — COMPREHENSIVE METABOLIC PANEL
ALT: 19 U/L (ref 0–44)
AST: 26 U/L (ref 15–41)
Albumin: 2.3 g/dL — ABNORMAL LOW (ref 3.5–5.0)
Alkaline Phosphatase: 58 U/L (ref 38–126)
Anion gap: 12 (ref 5–15)
BUN: 56 mg/dL — ABNORMAL HIGH (ref 8–23)
CO2: 23 mmol/L (ref 22–32)
Calcium: 8.4 mg/dL — ABNORMAL LOW (ref 8.9–10.3)
Chloride: 107 mmol/L (ref 98–111)
Creatinine, Ser: 1.87 mg/dL — ABNORMAL HIGH (ref 0.61–1.24)
GFR, Estimated: 33 mL/min — ABNORMAL LOW (ref >60)
Glucose, Bld: 133 mg/dL — ABNORMAL HIGH (ref 70–99)
Potassium: 3.4 mmol/L — ABNORMAL LOW (ref 3.5–5.1)
Sodium: 142 mmol/L (ref 135–145)
Total Bilirubin: 0.9 mg/dL (ref 0.3–1.2)
Total Protein: 5.8 g/dL — ABNORMAL LOW (ref 6.5–8.1)

## 2020-08-18 LAB — GLUCOSE, CAPILLARY
Glucose-Capillary: 133 mg/dL — ABNORMAL HIGH (ref 70–99)
Glucose-Capillary: 123 mg/dL — ABNORMAL HIGH (ref 70–99)
Glucose-Capillary: 134 mg/dL — ABNORMAL HIGH (ref 70–99)
Glucose-Capillary: 120 mg/dL — ABNORMAL HIGH (ref 70–99)

## 2020-08-18 LAB — URINE CULTURE: Culture: 30000 — AB

## 2020-08-18 LAB — MAGNESIUM: Magnesium: 2.5 mg/dL — ABNORMAL HIGH (ref 1.7–2.4)

## 2020-08-18 LAB — FERRITIN: Ferritin: 883 ng/mL — ABNORMAL HIGH (ref 24–336)

## 2020-08-18 LAB — C-REACTIVE PROTEIN: CRP: 10.9 mg/dL — ABNORMAL HIGH (ref <1.0)

## 2020-08-18 LAB — HEPARIN LEVEL (UNFRACTIONATED): Heparin Unfractionated: 0.61 IU/mL (ref 0.30–0.70)

## 2020-08-18 LAB — D-DIMER, QUANTITATIVE: D-Dimer, Quant: 0.53 ug/mL-FEU — ABNORMAL HIGH (ref 0.00–0.50)

## 2020-08-18 LAB — PHOSPHORUS: Phosphorus: 4.4 mg/dL (ref 2.5–4.6)

## 2020-08-18 MED ORDER — DOXYCYCLINE HYCLATE 100 MG PO TABS
100.0000 mg | ORAL_TABLET | Freq: Two times a day (BID) | ORAL | Status: DC
  Administered 2020-08-18 (×2): 100 mg via ORAL
  Filled 2020-08-18 (×3): qty 1

## 2020-08-18 MED ORDER — TAMSULOSIN HCL 0.4 MG PO CAPS
0.4000 mg | ORAL_CAPSULE | Freq: Every day | ORAL | Status: DC
  Administered 2020-08-18: 0.4 mg via ORAL
  Filled 2020-08-18: qty 1

## 2020-08-18 MED ORDER — GLUCERNA SHAKE PO LIQD
237.0000 mL | Freq: Four times a day (QID) | ORAL | Status: DC
  Administered 2020-08-18 – 2020-08-20 (×7): 237 mL via ORAL

## 2020-08-18 MED ORDER — TOCILIZUMAB 400 MG/20ML IV SOLN
800.0000 mg | Freq: Once | INTRAVENOUS | Status: AC
  Administered 2020-08-18: 800 mg via INTRAVENOUS
  Filled 2020-08-18: qty 40

## 2020-08-18 MED ORDER — FUROSEMIDE 10 MG/ML IJ SOLN
40.0000 mg | Freq: Every day | INTRAMUSCULAR | Status: DC
  Administered 2020-08-19: 40 mg via INTRAVENOUS
  Filled 2020-08-18: qty 4

## 2020-08-18 MED ORDER — POTASSIUM CHLORIDE 10 MEQ/100ML IV SOLN
10.0000 meq | INTRAVENOUS | Status: AC
  Administered 2020-08-18 (×4): 10 meq via INTRAVENOUS
  Filled 2020-08-18: qty 100

## 2020-08-18 MED ORDER — INSULIN GLARGINE 100 UNIT/ML ~~LOC~~ SOLN
10.0000 [IU] | Freq: Once | SUBCUTANEOUS | Status: AC
  Administered 2020-08-18: 10 [IU] via SUBCUTANEOUS
  Filled 2020-08-18: qty 0.1

## 2020-08-18 NOTE — Progress Notes (Signed)
ANTICOAGULATION CONSULT NOTE - Initial Consult  Pharmacy Consult for Heparin Indication: chest pain/ACS  Allergies  Allergen Reactions  . Amoxicillin     Other reaction(s): Unknown pt can take cephalosporin.    Patient Measurements: Height: 5\' 4"  (162.6 cm) Weight: 81.5 kg (179 lb 10.8 oz) IBW/kg (Calculated) : 59.2 Heparin Dosing Weight: 75 kg  Vital Signs: Temp: 98.8 F (37.1 C) (10/10 0400) BP: 149/61 (10/10 0700) Pulse Rate: 64 (10/10 0700)  Labs: Recent Labs    08/19/2020 1318 08/27/2020 1541 08/16/20 0219 08/16/20 0219 08/16/20 0456 08/16/20 0640 08/16/20 1524 08/17/20 0551 08/18/20 0609  HGB 13.2   < > 12.8*   < >  --   --   --  13.2 13.5  HCT 40.5   < > 37.7*  --   --   --   --  39.3 39.9  PLT 169   < > 161  --   --   --   --  257 273  APTT 33  --   --   --   --   --   --   --   --   LABPROT 15.7*  --   --   --   --   --   --   --   --   INR 1.3*  --   --   --   --   --   --   --   --   HEPARINUNFRC  --   --   --   --   --   --  0.60 0.55 0.61  CREATININE 2.11*   < > 1.86*  --   --   --   --  1.87* 1.87*  TROPONINIHS  --    < > 658*   < > 716* 684*  --  412*  --    < > = values in this interval not displayed.      Assessment: 84 y.o. male with COVID PNA and CHF, elevated troponins, for heparin.  On Heparin 5000 units SQ q8h for DVT prophylaxis. with last dose at 0440.  08/18/20 update Heparin level:0.61 IU/mL--> within therapeutic goal range on heparin at 1000 units/hr CBC:  Hb  13.5 (stable)   Plates [id number] RN reports no s/s of bleeding or issues with infusion site  Goal of Therapy:  Heparin level 0.3-0.7 units/ml Monitor platelets by anticoagulation protocol: Yes   Plan:  Continue heparin infusion rate at 1000 units/hr Check heparin level and CBC daily Monitor for s/s of bleeding.   Despina Pole 08/18/2020,9:49 AM

## 2020-08-18 NOTE — Progress Notes (Signed)
Pt is able to rest and is tolerating BiPAP well Mouth moisture is being used (lips/mouth) Pt was able to use alternative O2 device for a short period for ingestion of medication at ordered time and did not desaturate past 85% in that time

## 2020-08-18 NOTE — Progress Notes (Signed)
Patient Demographics:    [name] [name], is a 84 y.o. male, DOB - 25-Feb-1936, RSW:[id number]  Admit date - 08/14/2020   Admitting Physician Ejiroghene Arlyce Dice, MD  Outpatient Primary MD for the patient is [name], MD  LOS - 3   Chief Complaint  Patient presents with  . Shortness of Breath        Subjective:    [name] [name] today has persistent cough and shortness of breath with persistent hypoxia  More compliant with Bipap---- - Resting better with iv Fentanyl prn -wants to drink more Discussed with daughter [name] [name]   Assessment  & Plan :    Principal Problem:   Pneumonia due to COVID-19 virus Active Problems:   Acute respiratory failure with hypoxemia (HCC)   Acute respiratory failure with hypoxia (HCC)   Essential hypertension, benign   Acute renal failure superimposed on stage 3 chronic kidney disease (HCC)   CHF (congestive heart failure) (HCC)   Protein calorie malnutrition (HCC)   NSTEMI (non-ST elevated myocardial infarction) [facility])  Brief Summary:- 84 y.o. male with medical history significant for CABG, hypertension, ischemic cardiomyopathy, BPH admitted 08/16/2020 with severe Acute Hypoxic Resp Failure due to Covid Pneumonia and found to have elevated troponins with echocardiogram showing wall motion abnormalities and EF of 45 to 50%   A/p 1)Acute Hypoxic Respiratory Failure secondary to Covid Pneumonia--- -patient previously vaccinated Dxed 08/14/20 -Severe persistent hypoxia--- alternating between BiPAP and heated high flow oxygen with FiO2 of 100% and 45 L -VQ scan negative for PE -Lower extremity Dopplers requested -Continue IV steroids and remdesivir started on 08/21/2020 -Received Actemra on 08/17/2020 with consent from Pt and daughter --The patient has hypoxia and is high-risk for intubation, but expected to survive >48 hours and has good baseline functional  status.  She is not known to be on immunomodulators, anti-rejection medications, or cancer chemotherapy, has no history of TB or latent TB, and no history of diverticulitis or intestinal perforation.  Platelets are >50K, ANC is >500, and ALT/AST are below 5x ULN with no known hepatitis B infection. Baricitinib is being used under EUA by the FDA. The patient has no ESRD or AKI, known history of TB, severe neutropenia (ANC <500) or lymphopenia, or severe LFT elevations. They are not on DMARDs or probenecid, and are not pregnant. The option to use/refuse baricitinib treatment under FDA authorization (not approval), the significant known and potential risks and benefits, the extent to which these are unknown, and information regarding all available alternatives were discussed in detail. Specifically the risk of VTE and secondary infections were discussed in detail with the patient and/or HCPOA. They consent to proceed with treatment. - Continue Baricitinib/Actemra, c/n 14 days or until hospital discharge. Monitor Cr, LFTs, differential. Keep on VTE ppx COVID-19 Labs  Recent Labs    09/01/2020 1541 08/22/2020 1541 08/16/20 0219 08/17/20 0551 08/18/20 0609  DDIMER 0.64*   < > 0.85* 0.58* 0.53*  FERRITIN 867*   < > 870* 1,094* 883*  LDH 352*  --   --   --   --   CRP 29.0*   < > 26.9* 18.7* 10.9*   < > = values in this interval not displayed.    Lab Results  Component Value Date  [place] (A) 08/17/2020   [place] Detected (A) 08/14/2020   [place] Not Detected 10/19/2019   -Encourage prone positioning for More than 16 hours/day in increments of 2 to 3 hours at a time if able to tolerate --Attempt to maintain euvolemic state --Zinc and vitamin C as ordered -Albuterol inhaler as needed -Accu-Cheks/fingersticks while on high-dose steroids -PPI while on high-dose steroids -Enhanced dosage of anticoagulant for DVT prophylaxis given hypercoagulable state with COVID-19  infections   2)NSTEMI-elevated troponins noted, suspect this is related to severe hypoxia in the setting of Covid 19 pneumonia with acute hypoxic respiratory failure ----echo from 08/16/2020 with EF of 45 to 50% which is similar to prior EF based on gated images from April 2019 Echo from 08/16/2020 showed wall motion abnormalities-The basal inferolateral segment and basal inferior segment are hypokinetic -Continue IV heparin at least for 48 hours, possibly 72 hrs -Aspirin, metoprolol and Lipitor ordered -Patient remains chest pain-free Troponin--- 384>>439>>658>>716>>684>412 -He is very hypoxic currently requiring -Requiring  BiPAP alternating with heated high flow oxygen  in addition to a nonrebreather bag -QT segment was prolonged on admission--monitor mag and potassium closely  3)Possible concomitant community-acquired bacterial pneumonia--- patient with leukocytosis and elevated procalcitonin at 1.85 noted Treat with  Rocephin x 5 days -Initially treated with azithromycin -Strep and Legionella antigen Negative  4)HFrEF--patient with history of systolic dysfunction CHF/ischemic cardiomyopathy--- repeat echo with EF of 45 to 50% with wall motion abnormalities -Please see #2 above  5)AKI----acute kidney injury on CKD stage - IIIb   --- due to decreased p.o. intake in the setting of Covid infection -creatinine on admission= 2.11  , baseline creatinine = 1.3 to 1.4 (03/07/20)    , creatinine is now=1.87  ,  -Patient has only one kidney --renally adjust medications, avoid nephrotoxic agents / dehydration  / hypotension  6)Social/Ethics--- plan of care discussed with patient and daughter, patient is full code with full scope of treatment -However patient and patient's daughter would like to hold off on intubation for as long as possible and try all other needs including BiPAP prior to considering intubation if patient does not improve -Patient's wife just passed away from COVID-19 infection on  08/08/20 -Risk versus benefits of Actemra units discussed with patient and daughter consent obtained for administration of Actemra  7)MRSA UTI--- Treat with Doxycycline,   8)Urinary Retention--- has foley, give Flomax  Disposition/Need for in-Hospital Stay- patient unable to be discharged at this time due to --severe hypoxic respiratory failure secondary to COVID-19 infection/pneumonia requiring IV remdesivir, IV steroids and high flow supplemental oxygen on BiPAP, as well as NSTEMI requiring IV heparin  Status is: Inpatient  Remains inpatient appropriate because:severe hypoxic respiratory failure secondary to COVID-19 infection/pneumonia requiring IV remdesivir, IV steroids and high flow supplemental oxygen on BiPAP, as well as NSTEMI requiring IV heparin   Disposition: The patient is from: Home              Anticipated d/c is to: Home              Anticipated d/c date is: > 3 days              Patient currently is not medically stable to d/c. Barriers: Not Clinically Stable- severe hypoxic respiratory failure secondary to COVID-19 infection/pneumonia requiring IV remdesivir, IV steroids and high flow supplemental oxygen on BiPAP, as well as NSTEMI requiring IV heparin  Code Status : Full Code  Family Communication:     (patient is alert, awake and  coherent)  -Discussed with patient daughter [name] [name] who is a respiratory therapist  Consults  :  PCCM  DVT Prophylaxis  : IV heparin SCDs   Lab Results  Component Value Date   PLT 273 08/18/2020    Inpatient Medications  Scheduled Meds: . albuterol  2 puff Inhalation Q6H  . vitamin C  500 mg Oral Daily  . aspirin EC  81 mg Oral Q breakfast  . atorvastatin  40 mg Oral Daily  . Chlorhexidine Gluconate Cloth  6 each Topical Daily  . doxycycline  100 mg Oral Q12H  . feeding supplement (GLUCERNA SHAKE)  237 mL Oral TID BM  . [START ON 08/19/2020] furosemide  40 mg Intravenous Daily  . insulin aspart  0-5 Units Subcutaneous QHS  .  insulin aspart  0-9 Units Subcutaneous TID WC  . methylPREDNISolone (SOLU-MEDROL) injection  40 mg Intravenous Q8H  . metoprolol tartrate  25 mg Oral BID  . pantoprazole (PROTONIX) IV  40 mg Intravenous Q24H  . zinc sulfate  220 mg Oral Daily   Continuous Infusions: . azithromycin Stopped (08/17/20 1804)  . cefTRIAXone (ROCEPHIN)  IV Stopped (08/17/20 1447)  . heparin 1,000 Units/hr (08/18/20 0600)  . remdesivir 100 mg in NS 100 mL 100 mg (08/18/20 1033)   PRN Meds:.acetaminophen, cloNIDine, fentaNYL (SUBLIMAZE) injection, guaiFENesin-dextromethorphan, haloperidol lactate, ondansetron **OR** ondansetron (ZOFRAN) IV, polyethylene glycol   Anti-infectives (From admission, onward)   Start     Dose/Rate Route Frequency Ordered Stop   08/18/20 1145  doxycycline (VIBRA-TABS) tablet 100 mg        100 mg Oral Every 12 hours 08/18/20 1141     08/16/20 1730  azithromycin (ZITHROMAX) 500 mg in sodium chloride 0.9 % 250 mL IVPB        500 mg 250 mL/hr over 60 Minutes Intravenous Every 24 hours 08/16/20 1728 08/18/20 2359   08/16/20 1300  cefTRIAXone (ROCEPHIN) 1 g in sodium chloride 0.9 % 100 mL IVPB        1 g 200 mL/hr over 30 Minutes Intravenous Every 24 hours 08/12/2020 2055 08/19/20 2359   08/16/20 1000  remdesivir 100 mg in sodium chloride 0.9 % 100 mL IVPB        100 mg 200 mL/hr over 30 Minutes Intravenous Daily 08/23/2020 1728 09/01/2020 0959   08/16/20 0100  doxycycline (VIBRAMYCIN) 100 mg in sodium chloride 0.9 % 250 mL IVPB  Status:  Discontinued        100 mg 125 mL/hr over 120 Minutes Intravenous Every 12 hours 08/19/2020 2055 08/16/20 1728   08/12/2020 1730  remdesivir 100 mg in sodium chloride 0.9 % 100 mL IVPB        100 mg 200 mL/hr over 30 Minutes Intravenous Every 1 hr x 2 09/02/2020 1728 08/16/2020 2008   08/31/2020 1330  levofloxacin (LEVAQUIN) IVPB 750 mg        750 mg 100 mL/hr over 90 Minutes Intravenous  Once 08/27/2020 1318 08/25/2020 1418        Objective:   Vitals:   08/18/20  0600 08/18/20 0700 08/18/20 0849 08/18/20 1141  BP: (!) 148/57 (!) 149/61    Pulse: 62 64  61  Resp: (!) 39 (!) 31  (!) 40  Temp:    97.6 F (36.4 C)  TempSrc:    Oral  SpO2: (!) 87% (!) 83% 91% 90%  Weight: 81.5 kg     Height:        Wt Readings from Last 3 Encounters:  08/18/20 81.5 kg  07/10/20 81.6 kg  05/07/20 81.7 kg     Intake/Output Summary (Last 24 hours) at 08/18/2020 1145 Last data filed at 08/18/2020 0600 Gross per 24 hour  Intake 719.84 ml  Output 600 ml  Net 119.84 ml    Physical Exam Gen:- Awake Alert, conversational dyspnea HEENT:- [place].AT, No sclera icterus Nose- Bipap Mask alternating with HFNC Neck-Supple Neck,No JVD,.  Lungs-diminished breath sounds with scattered rhonchi CV- S1, S2 normal, regular  Abd-  +ve B.Sounds, Abd Soft, No tenderness,    Extremity/Skin:-Trace to +1 edema, pedal pulses present  Psych-affect is anxious at times, oriented x3 Neuro-generalized weakness, no new focal deficits, no tremors   Data Review:   Micro Results Recent Results (from the past 240 hour(s))  Novel Coronavirus, NAA ([facility])     Status: Abnormal   Collection Time: 08/14/20  3:50 PM   Specimen: Nasopharyngeal(NP) swabs in vial transport medium  Result Value Ref Range Status   SARS-CoV-2, NAA Detected (A) Not Detected Final    Comment: Patients who have a positive COVID-19 test result may now have treatment options. Treatment options are available for patients with mild to moderate symptoms and for hospitalized patients. Visit our website at [email or web address] for resources and information. This nucleic acid amplification test was developed and its performance characteristics determined by [employer]. Nucleic acid amplification tests include RT-PCR and TMA. This test has not been FDA cleared or approved. This test has been authorized by FDA under an Emergency Use Authorization (EUA). This test is only authorized for the duration of  time the declaration that circumstances exist justifying the authorization of the emergency use of in vitro diagnostic tests for detection of SARS-CoV-2 virus and/or diagnosis of COVID-19 infection under section 564(b)(1) of the Act, 21 U.S.C. 161WRU-0(A) (1), unless the authorization is terminated or revoked sooner. When diagnostic testing is negativ e, the possibility of a false negative result should be considered in the context of a patient's recent exposures and the presence of clinical signs and symptoms consistent with COVID-19. An individual without symptoms of COVID-19 and who is not shedding SARS-CoV-2 virus would expect to have a negative (not detected) result in this assay.   SARS-COV-2, NAA 2 DAY TAT     Status: None   Collection Time: 08/14/20  3:50 PM  Result Value Ref Range Status   SARS-CoV-2, NAA 2 DAY TAT Performed  Final  Resp Panel by RT PCR (RSV, Flu A&B, Covid) - Nasopharyngeal Swab     Status: Abnormal   Collection Time: 08/09/2020  1:13 PM   Specimen: Nasopharyngeal Swab  Result Value Ref Range Status   SARS Coronavirus 2 by RT PCR POSITIVE (A) NEGATIVE Final    Comment: RESULT CALLED TO, READ BACK BY AND VERIFIED WITH: DR. ZAMMIT AT 5409 ON [id number] BY THOMPSON S. (NOTE) SARS-CoV-2 target nucleic acids are DETECTED.  SARS-CoV-2 RNA is generally detectable in upper respiratory specimens  during the acute phase of infection. Positive results are indicative of the presence of the identified virus, but do not rule out bacterial infection or co-infection with other pathogens not detected by the test. Clinical correlation with patient history and other diagnostic information is necessary to determine patient infection status. The expected result is Negative.  Fact Sheet for Patients:  PinkCheek.be  Fact Sheet for Healthcare Providers: GravelBags.it  This test is not yet approved or cleared by the [place] FDA and  has been authorized for detection and/or diagnosis of SARS-CoV-2 by FDA  under an Emergency Use Authorization (EUA).  This EUA will remain in effect (meaning this tes t can be used) for the duration of  the COVID-19 declaration under Section 564(b)(1) of the Act, 21 U.S.C. section 360bbb-3(b)(1), unless the authorization is terminated or revoked sooner.      Influenza A by PCR NEGATIVE NEGATIVE Final   Influenza B by PCR NEGATIVE NEGATIVE Final    Comment: (NOTE) The Xpert Xpress SARS-CoV-2/FLU/RSV assay is intended as an aid in  the diagnosis of influenza from Nasopharyngeal swab specimens and  should not be used as a sole basis for treatment. Nasal washings and  aspirates are unacceptable for Xpert Xpress SARS-CoV-2/FLU/RSV  testing.  Fact Sheet for Patients: PinkCheek.be  Fact Sheet for Healthcare Providers: GravelBags.it  This test is not yet approved or cleared by the [place] FDA and  has been authorized for detection and/or diagnosis of SARS-CoV-2 by  FDA under an Emergency Use Authorization (EUA). This EUA will remain  in effect (meaning this test can be used) for the duration of the  Covid-19 declaration under Section 564(b)(1) of the Act, 21  U.S.C. section 360bbb-3(b)(1), unless the authorization is  terminated or revoked.    Respiratory Syncytial Virus by PCR NEGATIVE NEGATIVE Final    Comment: (NOTE) Fact Sheet for Patients: PinkCheek.be  Fact Sheet for Healthcare Providers: GravelBags.it  This test is not yet approved or cleared by the [place] FDA and  has been authorized for detection and/or diagnosis of SARS-CoV-2 by  FDA under an Emergency Use Authorization (EUA). This EUA will remain  in effect (meaning this test can be used) for the duration of the  COVID-19 declaration under Section 564(b)(1) of the Act, 21 U.S.C.   section 360bbb-3(b)(1), unless the authorization is terminated or  revoked. Performed at [facility], [address], [place], [place] [address]   Urine culture     Status: Abnormal   Collection Time: 08/28/2020  1:18 PM   Specimen: In/Out Cath Urine  Result Value Ref Range Status   Specimen Description   Final    IN/OUT CATH URINE Performed at [facility], [address], [place], [place] [address]    Special Requests   Final    NONE Performed at [facility], [address], [place], [place] [address]    Culture (A)  Final    30,000 COLONIES/mL METHICILLIN RESISTANT STAPHYLOCOCCUS AUREUS   Report Status 08/18/2020 FINAL  Final   Organism ID, Bacteria METHICILLIN RESISTANT STAPHYLOCOCCUS AUREUS (A)  Final      Susceptibility   Methicillin resistant staphylococcus aureus - MIC*    CIPROFLOXACIN >=8 RESISTANT Resistant     GENTAMICIN <=0.5 SENSITIVE Sensitive     NITROFURANTOIN 32 SENSITIVE Sensitive     OXACILLIN >=4 RESISTANT Resistant     TETRACYCLINE <=1 SENSITIVE Sensitive     VANCOMYCIN <=0.5 SENSITIVE Sensitive     TRIMETH/SULFA <=10 SENSITIVE Sensitive     CLINDAMYCIN RESISTANT Resistant     RIFAMPIN <=0.5 SENSITIVE Sensitive     Inducible Clindamycin POSITIVE Resistant     * 30,000 COLONIES/mL METHICILLIN RESISTANT STAPHYLOCOCCUS AUREUS  Blood Culture (routine x 2)     Status: None (Preliminary result)   Collection Time: 08/28/2020  1:42 PM   Specimen: BLOOD LEFT HAND  Result Value Ref Range Status   Specimen Description BLOOD LEFT HAND  Final   Special Requests   Final    BOTTLES DRAWN AEROBIC AND ANAEROBIC Blood Culture adequate volume   Culture  Final    NO GROWTH 2 DAYS Performed at [facility], [address] [place], [place] [address]    Report Status PENDING  Incomplete  Blood Culture (routine x 2)     Status: None (Preliminary result)   Collection Time: 08/19/2020  1:42 PM   Specimen: BLOOD LEFT ARM  Result Value Ref Range Status   Specimen Description  BLOOD LEFT ARM  Final   Special Requests   Final    BOTTLES DRAWN AEROBIC ONLY Blood Culture results may not be optimal due to an inadequate volume of blood received in culture bottles   Culture   Final    NO GROWTH 2 DAYS Performed at [facility], [address], [place], [place] [address]    Report Status PENDING  Incomplete  MRSA PCR Screening     Status: None   Collection Time: 08/27/2020  7:55 PM   Specimen: Nasal Mucosa; Nasopharyngeal  Result Value Ref Range Status   MRSA by PCR NEGATIVE NEGATIVE Final    Comment:        The GeneXpert MRSA Assay (FDA approved for NASAL specimens only), is one component of a comprehensive MRSA colonization surveillance program. It is not intended to diagnose MRSA infection nor to guide or monitor treatment for MRSA infections. Performed at [facility], [address] [place], [place] [address]     Radiology Reports NM Pulmonary Perf and Vent  Result Date: 08/16/2020 CLINICAL DATA:  Elevated D-dimer, COVID-19 positive EXAM: NUCLEAR MEDICINE PERFUSION LUNG SCAN TECHNIQUE: Perfusion images were obtained in multiple projections after intravenous injection of radiopharmaceutical. Ventilation scans intentionally deferred if perfusion scan and chest x-ray adequate for interpretation during COVID 19 epidemic. RADIOPHARMACEUTICALS:  4 mCi Tc-72m MAA IV COMPARISON:  None Correlation: Chest radiograph 09/02/2020 FINDINGS: Slightly diminished perfusion diffusely in LEFT lung versus RIGHT. Perfusion is much better than expected versus LEFT lung infiltrates seen on chest radiograph. No segmental or subsegmental perfusion defects. IMPRESSION: No scintigraphic evidence of pulmonary embolism. Electronically Signed   By: Lavonia Dana M.D.   On: 08/16/2020 14:35   DG CHEST PORT 1 VIEW  Result Date: 08/17/2020 CLINICAL DATA:  Dyspnea and respiratory abnormalities, COVID-19 positive, hypertension CHF EXAM: PORTABLE CHEST 1 VIEW COMPARISON:  Radiograph 08/12/2020  FINDINGS: Lung volumes appear slightly improved. There are persistent heterogeneous airspace opacities in both lungs as well as fissural and septal thickening and possible trace left effusion. Postsurgical changes from prior sternotomy and CABG with enlarged cardiac silhouette, similar to comparison and possibly reflecting some cardiomegaly or pericardial effusion. The aorta is calcified. The remaining cardiomediastinal contours are unremarkable. No acute osseous or soft tissue abnormality. Degenerative changes are present in the imaged spine and shoulders. Telemetry leads overlie the chest. IMPRESSION: 1. Slightly improved lung volumes. 2. Persistent bilateral heterogeneous airspace opacities and fissural thickening, compatible with multifocal infection in the setting of COVID-19 though some mild edema may be present as well with trace left effusion. 3. Enlarged cardiac silhouette, cardiomegaly versus pericardial effusion. 4.  Aortic Atherosclerosis (ICD10-I70.0). Electronically Signed   By: Lovena Le M.D.   On: 08/17/2020 15:13   DG Chest Port 1 View  Result Date: 08/19/2020 CLINICAL DATA:  Shortness of breath, oxygen desaturation EXAM: PORTABLE CHEST 1 VIEW COMPARISON:  Portable exam 1320 hours without priors for comparison FINDINGS: Lordotic positioning limits exam. Enlargement of cardiac silhouette with slight vascular congestion post CABG. Mediastinal contours normal. Subsegmental atelectasis RIGHT base. Hazy infiltrates in LEFT lung which could represent infection or less likely  asymmetric edema. New no pleural effusion or pneumothorax. Bones demineralized. IMPRESSION: Hazy infiltrates in LEFT lung question pneumonia less likely asymmetric edema. Enlargement of cardiac silhouette with pulmonary vascular congestion. Atelectasis at RIGHT base. Electronically Signed   By: Lavonia Dana M.D.   On: 09/04/2020 13:54   ECHOCARDIOGRAM LIMITED  Result Date: 08/16/2020    ECHOCARDIOGRAM LIMITED REPORT    Patient Name:   [name] [name] Date of Exam: 08/16/2020 Medical Rec #:  [id number]        Height:       64.0 in Accession #:    [id number]       Weight:       178.1 lb Date of Birth:  1936/02/07       BSA:          1.862 m Patient Age:    84 years         BP:           146/70 mmHg Patient Gender: M                HR:           66 bpm. Exam Location:  [facility] Procedure: Limited Echo, Cardiac Doppler and Color Doppler Indications:    Abnormal EKG  History:        Patient has no prior history of Echocardiogram examinations. CHF                 and Cardiomyopathy, CAD and Previous Myocardial Infarction,                 Prior CABG, Signs/Symptoms:Shortness of Breath; Risk                 Factors:Hypertension and Dyslipidemia. COVID+, resp. failure.  Sonographer:    Dustin Flock RDCS Referring Phys: [place]  1. Left ventricular ejection fraction, by estimation, is 45 to 50%. The left ventricle has mildly decreased function. The left ventricle demonstrates regional wall motion abnormalities (see scoring diagram/findings for description). There is moderate left ventricular hypertrophy.  2. Right ventricular systolic function is normal. The right ventricular size is normal. There is normal pulmonary artery systolic pressure. The estimated right ventricular systolic pressure is 29.5 mmHg.  3. The mitral valve is grossly normal. Mild mitral valve regurgitation.  4. The aortic valve is tricuspid. There is moderate calcification of the aortic valve. Aortic valve Vmax measures 1.79 m/s.  5. The inferior vena cava is normal in size with greater than 50% respiratory variability, suggesting right atrial pressure of 3 mmHg. FINDINGS  Left Ventricle: Left ventricular ejection fraction, by estimation, is 45 to 50%. The left ventricle has mildly decreased function. The left ventricle demonstrates regional wall motion abnormalities. The left ventricular internal cavity size was normal in size. There is  moderate left ventricular hypertrophy.  LV Wall Scoring: The basal inferolateral segment and basal inferior segment are hypokinetic. Right Ventricle: The right ventricular size is normal. No increase in right ventricular wall thickness. Right ventricular systolic function is normal. There is normal pulmonary artery systolic pressure. The tricuspid regurgitant velocity is 2.44 m/s, and  with an assumed right atrial pressure of 3 mmHg, the estimated right ventricular systolic pressure is 28.4 mmHg. Left Atrium: Left atrial size was normal in size. Right Atrium: Right atrial size was normal in size. Pericardium: There is no evidence of pericardial effusion. Mitral Valve: The mitral valve is grossly normal. Mild mitral annular calcification. Mild mitral valve regurgitation. Aortic  Valve: The aortic valve is tricuspid. There is moderate calcification of the aortic valve. There is mild aortic valve annular calcification. Aortic valve peak gradient measures 12.8 mmHg. Pulmonic Valve: The pulmonic valve was grossly normal. Pulmonic valve regurgitation is trivial. Aorta: The aortic root is normal in size and structure. Venous: The inferior vena cava is normal in size with greater than 50% respiratory variability, suggesting right atrial pressure of 3 mmHg. IAS/Shunts: No atrial level shunt detected by color flow Doppler. LEFT VENTRICLE PLAX 2D LVIDd:         4.69 cm  Diastology LVIDs:         3.89 cm  LV e' medial:    3.05 cm/s LV PW:         1.43 cm  LV E/e' medial:  23.3 LV IVS:        1.49 cm  LV e' lateral:   6.42 cm/s LVOT diam:     2.30 cm  LV E/e' lateral: 11.1 LVOT Area:     4.15 cm  LEFT ATRIUM         Index LA diam:    4.30 cm 2.31 cm/m  AORTIC VALVE AV Vmax:      179.00 cm/s AV Peak Grad: 12.8 mmHg  AORTA Ao Root diam: 3.40 cm MITRAL VALVE               TRICUSPID VALVE MV Area (PHT): 3.10 cm    TR Peak grad:   23.8 mmHg MV Decel Time: 245 msec    TR Vmax:        244.00 cm/s MV E velocity: 71.10 cm/s MV A  velocity: 82.70 cm/s  SHUNTS MV E/A ratio:  0.86        Systemic Diam: 2.30 cm Rozann Lesches MD Electronically signed by Rozann Lesches MD Signature Date/Time: 08/16/2020/4:46:06 PM    Final      CBC Recent Labs  Lab 08/10/2020 1318 08/09/2020 1541 08/16/20 0219 08/17/20 0551 08/18/20 0609  WBC 15.7* 13.5* 10.4 9.0 9.1  HGB 13.2 13.0 12.8* 13.2 13.5  HCT 40.5 38.9* 37.7* 39.3 39.9  PLT 169 153 161 257 273  MCV 81.7 80.4 79.0* 79.1* 79.2*  MCH 26.6 26.9 26.8 26.6 26.8  MCHC 32.6 33.4 34.0 33.6 33.8  RDW 14.2 14.4 14.2 14.6 15.1  LYMPHSABS 0.5* 0.3* 0.3* 0.3* 0.4*  MONOABS 0.6 0.4 0.4 0.5 0.7  EOSABS 0.0 0.0 0.0 0.0 0.2  BASOSABS 0.0 0.0 0.0 0.0 0.0    Chemistries  Recent Labs  Lab 08/30/2020 1318 08/21/2020 1541 08/16/20 0219 08/17/20 0551 08/18/20 0609  NA 131*  --  136 140 142  K 4.0  --  3.5 3.4* 3.4*  CL 101  --  105 106 107  CO2 18*  --  20* 22 23  GLUCOSE 234*  --  152* 141* 133*  BUN 30*  --  32* 46* 56*  CREATININE 2.11*  --  1.86* 1.87* 1.87*  CALCIUM 8.1*  --  8.1* 8.4* 8.4*  MG  --  1.9 1.9 2.3 2.5*  AST 31  --  28 29 26   ALT 17  --  17 17 19   ALKPHOS 71  --  67 59 58  BILITOT 1.4*  --  0.8 1.0 0.9   ------------------------------------------------------------------------------------------------------------------ Recent Labs    08/31/2020 1541  TRIG 134    No results found for: HGBA1C ------------------------------------------------------------------------------------------------------------------ No results for input(s): TSH, T4TOTAL, T3FREE, THYROIDAB in the last 72 hours.  Invalid input(s): FREET3 ------------------------------------------------------------------------------------------------------------------  Recent Labs    08/17/20 0551 08/18/20 0609  FERRITIN 1,094* 883*    Coagulation profile Recent Labs  Lab 08/17/2020 1318  INR 1.3*    Recent Labs    08/17/20 0551 08/18/20 0609  DDIMER 0.58* 0.53*    Cardiac Enzymes No results  for input(s): CKMB, TROPONINI, MYOGLOBIN in the last 168 hours.  Invalid input(s): CK ------------------------------------------------------------------------------------------------------------------    Component Value Date/Time   BNP 1,029.0 (H) 08/28/2020 1541   Roxan Hockey M.D on 08/18/2020 at 11:45 AM  Go to [email or web address] - for contact info  [facility] - Office  [phone]

## 2020-08-18 NOTE — Progress Notes (Signed)
Patient asking to come off BIPAP due his nose being sore.  Patient placed on [place] 100% @ 35L and tolerating well at this time.  RT will continue to monitor.

## 2020-08-19 LAB — COMPREHENSIVE METABOLIC PANEL
ALT: 23 U/L (ref 0–44)
AST: 37 U/L (ref 15–41)
Albumin: 2.7 g/dL — ABNORMAL LOW (ref 3.5–5.0)
Alkaline Phosphatase: 70 U/L (ref 38–126)
Anion gap: 13 (ref 5–15)
BUN: 58 mg/dL — ABNORMAL HIGH (ref 8–23)
CO2: 21 mmol/L — ABNORMAL LOW (ref 22–32)
Calcium: 8.6 mg/dL — ABNORMAL LOW (ref 8.9–10.3)
Chloride: 110 mmol/L (ref 98–111)
Creatinine, Ser: 1.77 mg/dL — ABNORMAL HIGH (ref 0.61–1.24)
GFR, Estimated: 35 mL/min — ABNORMAL LOW (ref >60)
Glucose, Bld: 138 mg/dL — ABNORMAL HIGH (ref 70–99)
Potassium: 3.8 mmol/L (ref 3.5–5.1)
Sodium: 144 mmol/L (ref 135–145)
Total Bilirubin: 1.2 mg/dL (ref 0.3–1.2)
Total Protein: 6.2 g/dL — ABNORMAL LOW (ref 6.5–8.1)

## 2020-08-19 LAB — CBC WITH DIFFERENTIAL/PLATELET
Abs Immature Granulocytes: 0.86 10*3/uL — ABNORMAL HIGH (ref 0.00–0.07)
Basophils Absolute: 0 10*3/uL (ref 0.0–0.1)
Basophils Relative: 0 %
Eosinophils Absolute: 0 10*3/uL (ref 0.0–0.5)
Eosinophils Relative: 0 %
HCT: 43.6 % (ref 39.0–52.0)
Hemoglobin: 14.7 g/dL (ref 13.0–17.0)
Immature Granulocytes: 5 %
Lymphocytes Relative: 3 %
Lymphs Abs: 0.5 10*3/uL — ABNORMAL LOW (ref 0.7–4.0)
MCH: 27.1 pg (ref 26.0–34.0)
MCHC: 33.7 g/dL (ref 30.0–36.0)
MCV: 80.3 fL (ref 80.0–100.0)
Monocytes Absolute: 1.7 10*3/uL — ABNORMAL HIGH (ref 0.1–1.0)
Monocytes Relative: 11 %
Neutro Abs: 13.2 10*3/uL — ABNORMAL HIGH (ref 1.7–7.7)
Neutrophils Relative %: 81 %
Platelets: 372 10*3/uL (ref 150–400)
RBC: 5.43 MIL/uL (ref 4.22–5.81)
RDW: 15.7 % — ABNORMAL HIGH (ref 11.5–15.5)
WBC: 16.2 10*3/uL — ABNORMAL HIGH (ref 4.0–10.5)
nRBC: 0 % (ref 0.0–0.2)

## 2020-08-19 LAB — D-DIMER, QUANTITATIVE: D-Dimer, Quant: 0.65 ug/mL-FEU — ABNORMAL HIGH (ref 0.00–0.50)

## 2020-08-19 LAB — PHOSPHORUS: Phosphorus: 4.5 mg/dL (ref 2.5–4.6)

## 2020-08-19 LAB — FERRITIN: Ferritin: 831 ng/mL — ABNORMAL HIGH (ref 24–336)

## 2020-08-19 LAB — MAGNESIUM: Magnesium: 2.7 mg/dL — ABNORMAL HIGH (ref 1.7–2.4)

## 2020-08-19 LAB — GLUCOSE, CAPILLARY
Glucose-Capillary: 121 mg/dL — ABNORMAL HIGH (ref 70–99)
Glucose-Capillary: 133 mg/dL — ABNORMAL HIGH (ref 70–99)
Glucose-Capillary: 180 mg/dL — ABNORMAL HIGH (ref 70–99)
Glucose-Capillary: 151 mg/dL — ABNORMAL HIGH (ref 70–99)

## 2020-08-19 LAB — C-REACTIVE PROTEIN: CRP: 8.1 mg/dL — ABNORMAL HIGH (ref <1.0)

## 2020-08-19 MED ORDER — SODIUM CHLORIDE 0.9 % IV SOLN
100.0000 mg | Freq: Two times a day (BID) | INTRAVENOUS | Status: DC
  Administered 2020-08-19 – 2020-08-20 (×3): 100 mg via INTRAVENOUS
  Filled 2020-08-19 (×4): qty 100

## 2020-08-19 MED ORDER — ENOXAPARIN SODIUM 40 MG/0.4ML ~~LOC~~ SOLN
40.0000 mg | SUBCUTANEOUS | Status: DC
  Administered 2020-08-20: 40 mg via SUBCUTANEOUS
  Filled 2020-08-19: qty 0.4

## 2020-08-19 NOTE — Progress Notes (Signed)
Patient Demographics:    [name] [name], is a 84 y.o. male, DOB - 04-29-1936, GUY:[id number]  Admit date - 09/07/2020   Admitting Physician Ejiroghene Arlyce Dice, MD  Outpatient Primary MD for the patient is [name], MD  LOS - 4  Chief Complaint  Patient presents with  . Shortness of Breath        Subjective:    [name] [name] today has persistent cough and shortness of breath with persistent hypoxia  Compliance with Bipap and Heated High Flow Nasal Canula remains inconsistent ---patient continues to pull of mask from time to time  --I was able to call patient's daughter [name] [name] from the phone in the room--she spoke with patient and encouraged patient to be more compliant with oxygen therapy -    Assessment  & Plan :    Principal Problem:   Pneumonia due to COVID-19 virus Active Problems:   Acute respiratory failure with hypoxemia (HCC)   Acute respiratory failure with hypoxia ([place])   Essential hypertension, benign   Acute renal failure superimposed on stage 3 chronic kidney disease (HCC)   CHF (congestive heart failure) (HCC)   Protein calorie malnutrition ([place])   NSTEMI (non-ST elevated myocardial infarction) [facility])  Brief Summary:- 84 y.o. male with medical history significant for CABG, hypertension, ischemic cardiomyopathy, BPH admitted 08/16/2020 with severe Acute Hypoxic Resp Failure due to Covid Pneumonia and found to have elevated troponins with echocardiogram showing wall motion abnormalities and EF of 45 to 50%   A/p 1)Acute Hypoxic Respiratory Failure secondary to Covid Pneumonia--- -patient previously vaccinated Dxed 08/14/20 -Severe persistent hypoxia--- alternating between BiPAP and heated high flow oxygen with FiO2 of 100% and 45 L -VQ scan negative for PE -Lower extremity Dopplers without acute DVT -Continue IV steroids started on 08/30/2020 -Completed IV remdesivir on  08/19/2020 -Received Actemra on 08/17/2020 with consent from Pt and daughter --The patient has hypoxia and is high-risk for intubation, but expected to survive >48 hours and has good baseline functional status.  She is not known to be on immunomodulators, anti-rejection medications, or cancer chemotherapy, has no history of TB or latent TB, and no history of diverticulitis or intestinal perforation.  Platelets are >50K, ANC is >500, and ALT/AST are below 5x ULN with no known hepatitis B infection. Baricitinib is being used under EUA by the FDA. The patient has no ESRD or AKI, known history of TB, severe neutropenia (ANC <500) or lymphopenia, or severe LFT elevations. They are not on DMARDs or probenecid, and are not pregnant. The option to use/refuse baricitinib treatment under FDA authorization (not approval), the significant known and potential risks and benefits, the extent to which these are unknown, and information regarding all available alternatives were discussed in detail. Specifically the risk of VTE and secondary infections were discussed in detail with the patient and/or HCPOA. They consent to proceed with treatment. - Continue Baricitinib/Actemra, c/n 14 days or until hospital discharge. Monitor Cr, LFTs, differential. Keep on VTE ppx COVID-19 Labs  Recent Labs    08/17/20 0551 08/18/20 0609 08/19/20 0504  DDIMER 0.58* 0.53* 0.65*  FERRITIN 1,094* 883* 831*  CRP 18.7* 10.9* 8.1*    Lab Results  Component Value Date   SARSCOV2NAA POSITIVE (A) 09/05/2020   SARSCOV2NAA Detected (  A) 08/14/2020   [place] Not Detected 10/19/2019   -Encourage prone positioning for More than 16 hours/day in increments of 2 to 3 hours at a time if able to tolerate --Attempt to maintain euvolemic state --Zinc and vitamin C as ordered -Albuterol inhaler as needed -Accu-Cheks/fingersticks while on high-dose steroids -PPI while on high-dose steroids -Enhanced dosage of anticoagulant for DVT prophylaxis  given hypercoagulable state with COVID-19 infections   2)NSTEMI-elevated troponins noted, suspect this is related to severe hypoxia in the setting of Covid 19 pneumonia with acute hypoxic respiratory failure ----echo from 08/16/2020 with EF of 45 to 50% which is similar to prior EF based on gated images from April 2019 Echo from 08/16/2020 showed wall motion abnormalities-The basal inferolateral segment and basal inferior segment are hypokinetic -Treated with IV heparin Thru 08/19/20 -Aspirin, metoprolol and Lipitor ordered -Patient remains chest pain-free Troponin--- 384>>439>>658>>716>>684>412 -He is very hypoxic currently requiring -Requiring  BiPAP alternating with heated high flow oxygen  in addition to a nonrebreather bag -QT segment was prolonged on admission--monitor mag and potassium closely  3)Possible concomitant community-acquired bacterial pneumonia--- patient with leukocytosis and elevated procalcitonin at 1.85 noted Treated with  Rocephin x 5 days -Initially treated with azithromycin -Strep and Legionella antigen Negative  4)HFrEF--patient with history of systolic dysfunction CHF/ischemic cardiomyopathy--- repeat echo with EF of 45 to 50% with wall motion abnormalities -Please see #2 above  5)AKI----acute kidney injury on CKD stage - IIIb   --- due to decreased p.o. intake in the setting of Covid infection -creatinine on admission= 2.11  , baseline creatinine = 1.3 to 1.4 (03/07/20)    , creatinine is now=1.7  ,  -Patient has only one kidney --renally adjust medications, avoid nephrotoxic agents / dehydration  / hypotension  6)Social/Ethics--- plan of care discussed with patient and daughter, patient is full code with full scope of treatment -However patient and patient's daughter would like to hold off on intubation for as long as possible and try all other needs including BiPAP prior to considering intubation if patient does not improve -Patient's wife just passed away from  COVID-19 infection on 08/08/20 -Risk versus benefits of Actemra units discussed with patient and daughter consent obtained for administration of Actemra  7)MRSA UTI--- Treat with Doxycycline,   8)Urinary Retention/BPH with LUTs--- has foley, c/n  Flomax, unable to do voiding trial at this time as patient is BiPAP dependent and unable to sit on the side of the bed to void  Disposition/Need for in-Hospital Stay- patient unable to be discharged at this time due to --severe hypoxic respiratory failure secondary to COVID-19 infection/pneumonia requiring IV remdesivir, IV steroids and high flow supplemental oxygen on BiPAP, as well as NSTEMI requiring IV heparin  Status is: Inpatient  Remains inpatient appropriate because:severe hypoxic respiratory failure secondary to COVID-19 infection/pneumonia requiring IV remdesivir, IV steroids and high flow supplemental oxygen on BiPAP, as well as NSTEMI requiring IV heparin   Disposition: The patient is from: Home              Anticipated d/c is to: Home              Anticipated d/c date is: > 3 days              Patient currently is not medically stable to d/c. Barriers: Not Clinically Stable- severe hypoxic respiratory failure secondary to COVID-19 infection/pneumonia requiring IV remdesivir, IV steroids and high flow supplemental oxygen on BiPAP, as well as NSTEMI requiring IV heparin  Code Status :  Full Code  Family Communication:     (patient is alert, awake and coherent)  -Discussed with patient daughter [name] [name] who is a respiratory therapist  Consults  :  PCCM  DVT Prophylaxis  : IV heparin SCDs   Lab Results  Component Value Date   PLT 372 08/19/2020    Inpatient Medications  Scheduled Meds: . albuterol  2 puff Inhalation Q6H  . vitamin C  500 mg Oral Daily  . aspirin EC  81 mg Oral Q breakfast  . atorvastatin  40 mg Oral Daily  . Chlorhexidine Gluconate Cloth  6 each Topical Daily  . doxycycline  100 mg Oral Q12H  . feeding  supplement (GLUCERNA SHAKE)  237 mL Oral QID  . furosemide  40 mg Intravenous Daily  . insulin aspart  0-5 Units Subcutaneous QHS  . insulin aspart  0-9 Units Subcutaneous TID WC  . methylPREDNISolone (SOLU-MEDROL) injection  40 mg Intravenous Q8H  . metoprolol tartrate  25 mg Oral BID  . pantoprazole (PROTONIX) IV  40 mg Intravenous Q24H  . tamsulosin  0.4 mg Oral QPC supper  . zinc sulfate  220 mg Oral Daily   Continuous Infusions:  PRN Meds:.acetaminophen, cloNIDine, fentaNYL (SUBLIMAZE) injection, guaiFENesin-dextromethorphan, haloperidol lactate, ondansetron **OR** ondansetron (ZOFRAN) IV, polyethylene glycol   Anti-infectives (From admission, onward)   Start     Dose/Rate Route Frequency Ordered Stop   08/18/20 1300  doxycycline (VIBRA-TABS) tablet 100 mg        100 mg Oral Every 12 hours 08/18/20 1141     08/16/20 1730  azithromycin (ZITHROMAX) 500 mg in sodium chloride 0.9 % 250 mL IVPB        500 mg 250 mL/hr over 60 Minutes Intravenous Every 24 hours 08/16/20 1728 08/18/20 1900   08/16/20 1300  cefTRIAXone (ROCEPHIN) 1 g in sodium chloride 0.9 % 100 mL IVPB        1 g 200 mL/hr over 30 Minutes Intravenous Every 24 hours 08/12/2020 2055 08/19/20 1240   08/16/20 1000  remdesivir 100 mg in sodium chloride 0.9 % 100 mL IVPB        100 mg 200 mL/hr over 30 Minutes Intravenous Daily 09/02/2020 1728 08/19/20 1000   08/16/20 0100  doxycycline (VIBRAMYCIN) 100 mg in sodium chloride 0.9 % 250 mL IVPB  Status:  Discontinued        100 mg 125 mL/hr over 120 Minutes Intravenous Every 12 hours 08/09/2020 2055 08/16/20 1728   09/01/2020 1730  remdesivir 100 mg in sodium chloride 0.9 % 100 mL IVPB        100 mg 200 mL/hr over 30 Minutes Intravenous Every 1 hr x 2 08/24/2020 1728 09/07/2020 2008   08/11/2020 1330  levofloxacin (LEVAQUIN) IVPB 750 mg        750 mg 100 mL/hr over 90 Minutes Intravenous  Once 08/11/2020 1318 09/07/2020 1418        Objective:   Vitals:   08/19/20 1100 08/19/20 1147  08/19/20 1200 08/19/20 1300  BP: (!) 148/67  (!) 158/84 (!) 170/65  Pulse: 68  70 71  Resp: (!) 34  (!) 30 (!) 29  Temp: 98.9 F (37.2 C)     TempSrc: Axillary     SpO2: 91% 98% 100% 100%  Weight:      Height:        Wt Readings from Last 3 Encounters:  08/19/20 80.1 kg  07/10/20 81.6 kg  05/07/20 81.7 kg     Intake/Output Summary (Last  24 hours) at 08/19/2020 1413 Last data filed at 08/19/2020 1240 Gross per 24 hour  Intake 540.02 ml  Output 2025 ml  Net -1484.98 ml    Physical Exam Gen:- Awake Alert, conversational dyspnea HEENT:- [place].AT, No sclera icterus Nose- Bipap Mask alternating with HFNC Neck-Supple Neck,No JVD,.  Lungs-diminished breath sounds with scattered rhonchi CV- S1, S2 normal, regular  Abd-  +ve B.Sounds, Abd Soft, No tenderness,    Extremity/Skin:-Trace to +1 edema, pedal pulses present  Psych-affect is anxious at times, oriented x3 Neuro-generalized weakness, no new focal deficits, no tremors   Data Review:   Micro Results Recent Results (from the past 240 hour(s))  Novel Coronavirus, NAA ([facility])     Status: Abnormal   Collection Time: 08/14/20  3:50 PM   Specimen: Nasopharyngeal(NP) swabs in vial transport medium  Result Value Ref Range Status   SARS-CoV-2, NAA Detected (A) Not Detected Final    Comment: Patients who have a positive COVID-19 test result may now have treatment options. Treatment options are available for patients with mild to moderate symptoms and for hospitalized patients. Visit our website at [email or web address] for resources and information. This nucleic acid amplification test was developed and its performance characteristics determined by [employer]. Nucleic acid amplification tests include RT-PCR and TMA. This test has not been FDA cleared or approved. This test has been authorized by FDA under an Emergency Use Authorization (EUA). This test is only authorized for the duration of time the  declaration that circumstances exist justifying the authorization of the emergency use of in vitro diagnostic tests for detection of SARS-CoV-2 virus and/or diagnosis of COVID-19 infection under section 564(b)(1) of the Act, 21 U.S.C. 419FXT-0(W) (1), unless the authorization is terminated or revoked sooner. When diagnostic testing is negativ e, the possibility of a false negative result should be considered in the context of a patient's recent exposures and the presence of clinical signs and symptoms consistent with COVID-19. An individual without symptoms of COVID-19 and who is not shedding SARS-CoV-2 virus would expect to have a negative (not detected) result in this assay.   SARS-COV-2, NAA 2 DAY TAT     Status: None   Collection Time: 08/14/20  3:50 PM  Result Value Ref Range Status   SARS-CoV-2, NAA 2 DAY TAT Performed  Final  Resp Panel by RT PCR (RSV, Flu A&B, Covid) - Nasopharyngeal Swab     Status: Abnormal   Collection Time: 09/08/2020  1:13 PM   Specimen: Nasopharyngeal Swab  Result Value Ref Range Status   SARS Coronavirus 2 by RT PCR POSITIVE (A) NEGATIVE Final    Comment: RESULT CALLED TO, READ BACK BY AND VERIFIED WITH: DR. ZAMMIT AT [id number] ON [id number] BY THOMPSON S. (NOTE) SARS-CoV-2 target nucleic acids are DETECTED.  SARS-CoV-2 RNA is generally detectable in upper respiratory specimens  during the acute phase of infection. Positive results are indicative of the presence of the identified virus, but do not rule out bacterial infection or co-infection with other pathogens not detected by the test. Clinical correlation with patient history and other diagnostic information is necessary to determine patient infection status. The expected result is Negative.  Fact Sheet for Patients:  PinkCheek.be  Fact Sheet for Healthcare Providers: GravelBags.it  This test is not yet approved or cleared by the [place]  FDA and  has been authorized for detection and/or diagnosis of SARS-CoV-2 by FDA under an Emergency Use Authorization (EUA).  This EUA will remain in effect (meaning this tes t can  be used) for the duration of  the COVID-19 declaration under Section 564(b)(1) of the Act, 21 U.S.C. section 360bbb-3(b)(1), unless the authorization is terminated or revoked sooner.      Influenza A by PCR NEGATIVE NEGATIVE Final   Influenza B by PCR NEGATIVE NEGATIVE Final    Comment: (NOTE) The Xpert Xpress SARS-CoV-2/FLU/RSV assay is intended as an aid in  the diagnosis of influenza from Nasopharyngeal swab specimens and  should not be used as a sole basis for treatment. Nasal washings and  aspirates are unacceptable for Xpert Xpress SARS-CoV-2/FLU/RSV  testing.  Fact Sheet for Patients: PinkCheek.be  Fact Sheet for Healthcare Providers: GravelBags.it  This test is not yet approved or cleared by the [place] FDA and  has been authorized for detection and/or diagnosis of SARS-CoV-2 by  FDA under an Emergency Use Authorization (EUA). This EUA will remain  in effect (meaning this test can be used) for the duration of the  Covid-19 declaration under Section 564(b)(1) of the Act, 21  U.S.C. section 360bbb-3(b)(1), unless the authorization is  terminated or revoked.    Respiratory Syncytial Virus by PCR NEGATIVE NEGATIVE Final    Comment: (NOTE) Fact Sheet for Patients: PinkCheek.be  Fact Sheet for Healthcare Providers: GravelBags.it  This test is not yet approved or cleared by the [place] FDA and  has been authorized for detection and/or diagnosis of SARS-CoV-2 by  FDA under an Emergency Use Authorization (EUA). This EUA will remain  in effect (meaning this test can be used) for the duration of the  COVID-19 declaration under Section 564(b)(1) of the Act, 21 U.S.C.    section 360bbb-3(b)(1), unless the authorization is terminated or  revoked. Performed at [facility], [address] [place], [place] [address]   Urine culture     Status: Abnormal   Collection Time: 08/21/2020  1:18 PM   Specimen: In/Out Cath Urine  Result Value Ref Range Status   Specimen Description   Final    IN/OUT CATH URINE Performed at [facility], [address] [place], [place] [address]    Special Requests   Final    NONE Performed at [facility], [address], [place],  [address]    Culture (A)  Final    30,000 COLONIES/mL METHICILLIN RESISTANT STAPHYLOCOCCUS AUREUS   Report Status 08/18/2020 FINAL  Final   Organism ID, Bacteria METHICILLIN RESISTANT STAPHYLOCOCCUS AUREUS (A)  Final      Susceptibility   Methicillin resistant staphylococcus aureus - MIC*    CIPROFLOXACIN >=8 RESISTANT Resistant     GENTAMICIN <=0.5 SENSITIVE Sensitive     NITROFURANTOIN 32 SENSITIVE Sensitive     OXACILLIN >=4 RESISTANT Resistant     TETRACYCLINE <=1 SENSITIVE Sensitive     VANCOMYCIN <=0.5 SENSITIVE Sensitive     TRIMETH/SULFA <=10 SENSITIVE Sensitive     CLINDAMYCIN RESISTANT Resistant     RIFAMPIN <=0.5 SENSITIVE Sensitive     Inducible Clindamycin POSITIVE Resistant     * 30,000 COLONIES/mL METHICILLIN RESISTANT STAPHYLOCOCCUS AUREUS  Blood Culture (routine x 2)     Status: None (Preliminary result)   Collection Time: 09/03/2020  1:42 PM   Specimen: BLOOD LEFT HAND  Result Value Ref Range Status   Specimen Description BLOOD LEFT HAND  Final   Special Requests   Final    BOTTLES DRAWN AEROBIC AND ANAEROBIC Blood Culture adequate volume   Culture   Final    NO GROWTH 4 DAYS Performed at [facility], [address]  [place], [place] [address]    Report Status PENDING  Incomplete  Blood Culture (routine x 2)     Status: None (Preliminary result)   Collection Time: 08/16/2020  1:42 PM   Specimen: BLOOD LEFT ARM  Result Value Ref Range Status   Specimen Description  BLOOD LEFT ARM  Final   Special Requests   Final    BOTTLES DRAWN AEROBIC ONLY Blood Culture results may not be optimal due to an inadequate volume of blood received in culture bottles   Culture   Final    NO GROWTH 4 DAYS Performed at [facility], [address] [place] [address]    Report Status PENDING  Incomplete  MRSA PCR Screening     Status: None   Collection Time: 09/08/2020  7:55 PM   Specimen: Nasal Mucosa; Nasopharyngeal  Result Value Ref Range Status   MRSA by PCR NEGATIVE NEGATIVE Final    Comment:        The GeneXpert MRSA Assay (FDA approved for NASAL specimens only), is one component of a comprehensive MRSA colonization surveillance program. It is not intended to diagnose MRSA infection nor to guide or monitor treatment for MRSA infections. Performed at [facility], [address] [place], [place] [address]     Radiology Reports NM Pulmonary Perf and Vent  Result Date: 08/16/2020 CLINICAL DATA:  Elevated D-dimer, COVID-19 positive EXAM: NUCLEAR MEDICINE PERFUSION LUNG SCAN TECHNIQUE: Perfusion images were obtained in multiple projections after intravenous injection of radiopharmaceutical. Ventilation scans intentionally deferred if perfusion scan and chest x-ray adequate for interpretation during COVID 19 epidemic. RADIOPHARMACEUTICALS:  4 mCi Tc-48m MAA IV COMPARISON:  None Correlation: Chest radiograph 08/31/2020 FINDINGS: Slightly diminished perfusion diffusely in LEFT lung versus RIGHT. Perfusion is much better than expected versus LEFT lung infiltrates seen on chest radiograph. No segmental or subsegmental perfusion defects. IMPRESSION: No scintigraphic evidence of pulmonary embolism. Electronically Signed   By: Lavonia Dana M.D.   On: 08/16/2020 14:35   US Venous Img Lower Bilateral (DVT)  Result Date: 08/18/2020 CLINICAL DATA:  COVID, pulmonary embolism, elevated D-dimer EXAM: BILATERAL LOWER EXTREMITY VENOUS DOPPLER ULTRASOUND TECHNIQUE: Gray-scale  sonography with compression, as well as color and duplex ultrasound, were performed to evaluate the deep venous system(s) from the level of the common femoral vein through the popliteal and proximal calf veins. COMPARISON:  None. FINDINGS: VENOUS Normal compressibility of the common femoral, superficial femoral, and popliteal veins, as well as the visualized calf veins. Visualized portions of profunda femoral vein and great saphenous vein unremarkable. No filling defects to suggest DVT on grayscale or color Doppler imaging. Doppler waveforms show normal direction of venous flow, normal respiratory phasicity and response to augmentation. Limited views of the contralateral common femoral vein are unremarkable. OTHER Bilateral subcutaneous edema at the ankle level. Limitations: none IMPRESSION: No femoropopliteal DVT nor evidence of DVT within the visualized calf veins. If clinical symptoms are inconsistent or if there are persistent or worsening symptoms, further imaging (possibly involving the iliac veins) may be warranted. Electronically Signed   By: Lucrezia Europe M.D.   On: 08/18/2020 15:05   DG CHEST PORT 1 VIEW  Result Date: 08/17/2020 CLINICAL DATA:  Dyspnea and respiratory abnormalities, COVID-19 positive, hypertension CHF EXAM: PORTABLE CHEST 1 VIEW COMPARISON:  Radiograph 08/12/2020 FINDINGS: Lung volumes appear slightly improved. There are persistent heterogeneous airspace opacities in both lungs as well as fissural and septal thickening and possible trace left effusion. Postsurgical changes from prior sternotomy and CABG with enlarged  cardiac silhouette, similar to comparison and possibly reflecting some cardiomegaly or pericardial effusion. The aorta is calcified. The remaining cardiomediastinal contours are unremarkable. No acute osseous or soft tissue abnormality. Degenerative changes are present in the imaged spine and shoulders. Telemetry leads overlie the chest. IMPRESSION: 1. Slightly improved lung  volumes. 2. Persistent bilateral heterogeneous airspace opacities and fissural thickening, compatible with multifocal infection in the setting of COVID-19 though some mild edema may be present as well with trace left effusion. 3. Enlarged cardiac silhouette, cardiomegaly versus pericardial effusion. 4.  Aortic Atherosclerosis (ICD10-I70.0). Electronically Signed   By: Lovena Le M.D.   On: 08/17/2020 15:13   DG Chest Port 1 View  Result Date: 08/29/2020 CLINICAL DATA:  Shortness of breath, oxygen desaturation EXAM: PORTABLE CHEST 1 VIEW COMPARISON:  Portable exam 1320 hours without priors for comparison FINDINGS: Lordotic positioning limits exam. Enlargement of cardiac silhouette with slight vascular congestion post CABG. Mediastinal contours normal. Subsegmental atelectasis RIGHT base. Hazy infiltrates in LEFT lung which could represent infection or less likely asymmetric edema. New no pleural effusion or pneumothorax. Bones demineralized. IMPRESSION: Hazy infiltrates in LEFT lung question pneumonia less likely asymmetric edema. Enlargement of cardiac silhouette with pulmonary vascular congestion. Atelectasis at RIGHT base. Electronically Signed   By: Lavonia Dana M.D.   On: 09/05/2020 13:54   ECHOCARDIOGRAM LIMITED  Result Date: 08/16/2020    ECHOCARDIOGRAM LIMITED REPORT   Patient Name:   JAMY CLECKLER Date of Exam: 08/16/2020 Medical Rec #:  [id number]        Height:       64.0 in Accession #:    [id number]       Weight:       178.1 lb Date of Birth:  09-17-1936       BSA:          1.862 m Patient Age:    25 years         BP:           146/70 mmHg Patient Gender: M                HR:           66 bpm. Exam Location:  [facility] Procedure: Limited Echo, Cardiac Doppler and Color Doppler Indications:    Abnormal EKG  History:        Patient has no prior history of Echocardiogram examinations. CHF                 and Cardiomyopathy, CAD and Previous Myocardial Infarction,                 Prior CABG,  Signs/Symptoms:Shortness of Breath; Risk                 Factors:Hypertension and Dyslipidemia. COVID+, resp. failure.  Sonographer:    Dustin Flock RDCS Referring Phys: [place]  1. Left ventricular ejection fraction, by estimation, is 45 to 50%. The left ventricle has mildly decreased function. The left ventricle demonstrates regional wall motion abnormalities (see scoring diagram/findings for description). There is moderate left ventricular hypertrophy.  2. Right ventricular systolic function is normal. The right ventricular size is normal. There is normal pulmonary artery systolic pressure. The estimated right ventricular systolic pressure is 58.3 mmHg.  3. The mitral valve is grossly normal. Mild mitral valve regurgitation.  4. The aortic valve is tricuspid. There is moderate calcification of the aortic valve. Aortic valve Vmax measures 1.79 m/s.  5. The  inferior vena cava is normal in size with greater than 50% respiratory variability, suggesting right atrial pressure of 3 mmHg. FINDINGS  Left Ventricle: Left ventricular ejection fraction, by estimation, is 45 to 50%. The left ventricle has mildly decreased function. The left ventricle demonstrates regional wall motion abnormalities. The left ventricular internal cavity size was normal in size. There is moderate left ventricular hypertrophy.  LV Wall Scoring: The basal inferolateral segment and basal inferior segment are hypokinetic. Right Ventricle: The right ventricular size is normal. No increase in right ventricular wall thickness. Right ventricular systolic function is normal. There is normal pulmonary artery systolic pressure. The tricuspid regurgitant velocity is 2.44 m/s, and  with an assumed right atrial pressure of 3 mmHg, the estimated right ventricular systolic pressure is 93.2 mmHg. Left Atrium: Left atrial size was normal in size. Right Atrium: Right atrial size was normal in size. Pericardium: There is no evidence  of pericardial effusion. Mitral Valve: The mitral valve is grossly normal. Mild mitral annular calcification. Mild mitral valve regurgitation. Aortic Valve: The aortic valve is tricuspid. There is moderate calcification of the aortic valve. There is mild aortic valve annular calcification. Aortic valve peak gradient measures 12.8 mmHg. Pulmonic Valve: The pulmonic valve was grossly normal. Pulmonic valve regurgitation is trivial. Aorta: The aortic root is normal in size and structure. Venous: The inferior vena cava is normal in size with greater than 50% respiratory variability, suggesting right atrial pressure of 3 mmHg. IAS/Shunts: No atrial level shunt detected by color flow Doppler. LEFT VENTRICLE PLAX 2D LVIDd:         4.69 cm  Diastology LVIDs:         3.89 cm  LV e' medial:    3.05 cm/s LV PW:         1.43 cm  LV E/e' medial:  23.3 LV IVS:        1.49 cm  LV e' lateral:   6.42 cm/s LVOT diam:     2.30 cm  LV E/e' lateral: 11.1 LVOT Area:     4.15 cm  LEFT ATRIUM         Index LA diam:    4.30 cm 2.31 cm/m  AORTIC VALVE AV Vmax:      179.00 cm/s AV Peak Grad: 12.8 mmHg  AORTA Ao Root diam: 3.40 cm MITRAL VALVE               TRICUSPID VALVE MV Area (PHT): 3.10 cm    TR Peak grad:   23.8 mmHg MV Decel Time: 245 msec    TR Vmax:        244.00 cm/s MV E velocity: 71.10 cm/s MV A velocity: 82.70 cm/s  SHUNTS MV E/A ratio:  0.86        Systemic Diam: 2.30 cm Rozann Lesches MD Electronically signed by Rozann Lesches MD Signature Date/Time: 08/16/2020/4:46:06 PM    Final      CBC Recent Labs  Lab 08/12/2020 1541 08/16/20 0219 08/17/20 0551 08/18/20 0609 08/19/20 0504  WBC 13.5* 10.4 9.0 9.1 16.2*  HGB 13.0 12.8* 13.2 13.5 14.7  HCT 38.9* 37.7* 39.3 39.9 43.6  PLT 153 161 257 273 372  MCV 80.4 79.0* 79.1* 79.2* 80.3  MCH 26.9 26.8 26.6 26.8 27.1  MCHC 33.4 34.0 33.6 33.8 33.7  RDW 14.4 14.2 14.6 15.1 15.7*  LYMPHSABS 0.3* 0.3* 0.3* 0.4* 0.5*  MONOABS 0.4 0.4 0.5 0.7 1.7*  EOSABS 0.0 0.0 0.0 0.2  0.0  BASOSABS 0.0 0.0  0.0 0.0 0.0    Chemistries  Recent Labs  Lab 08/14/2020 1318 08/29/2020 1541 08/16/20 0219 08/17/20 0551 08/18/20 0609 08/19/20 0504  NA 131*  --  136 140 142 144  K 4.0  --  3.5 3.4* 3.4* 3.8  CL 101  --  105 106 107 110  CO2 18*  --  20* 22 23 21*  GLUCOSE 234*  --  152* 141* 133* 138*  BUN 30*  --  32* 46* 56* 58*  CREATININE 2.11*  --  1.86* 1.87* 1.87* 1.77*  CALCIUM 8.1*  --  8.1* 8.4* 8.4* 8.6*  MG  --  1.9 1.9 2.3 2.5* 2.7*  AST 31  --  28 29 26  37  ALT 17  --  17 17 19 23   ALKPHOS 71  --  67 59 58 70  BILITOT 1.4*  --  0.8 1.0 0.9 1.2   ------------------------------------------------------------------------------------------------------------------ No results for input(s): CHOL, HDL, LDLCALC, TRIG, CHOLHDL, LDLDIRECT in the last 72 hours.  No results found for: HGBA1C ------------------------------------------------------------------------------------------------------------------ No results for input(s): TSH, T4TOTAL, T3FREE, THYROIDAB in the last 72 hours.  Invalid input(s): FREET3 ------------------------------------------------------------------------------------------------------------------ Recent Labs    08/18/20 0609 08/19/20 0504  FERRITIN 883* 831*    Coagulation profile Recent Labs  Lab 08/22/2020 1318  INR 1.3*    Recent Labs    08/18/20 0609 08/19/20 0504  DDIMER 0.53* 0.65*    Cardiac Enzymes No results for input(s): CKMB, TROPONINI, MYOGLOBIN in the last 168 hours.  Invalid input(s): CK ------------------------------------------------------------------------------------------------------------------    Component Value Date/Time   BNP 1,029.0 (H) 08/25/2020 1541   Roxan Hockey M.D on 08/19/2020 at 2:13 PM  Go to [email or web address] - for contact info  [facility] - Office  [phone]

## 2020-08-20 LAB — COMPREHENSIVE METABOLIC PANEL
ALT: 25 U/L (ref 0–44)
AST: 32 U/L (ref 15–41)
Albumin: 2.7 g/dL — ABNORMAL LOW (ref 3.5–5.0)
Alkaline Phosphatase: 77 U/L (ref 38–126)
Anion gap: 12 (ref 5–15)
BUN: 56 mg/dL — ABNORMAL HIGH (ref 8–23)
CO2: 23 mmol/L (ref 22–32)
Calcium: 8.7 mg/dL — ABNORMAL LOW (ref 8.9–10.3)
Chloride: 111 mmol/L (ref 98–111)
Creatinine, Ser: 1.72 mg/dL — ABNORMAL HIGH (ref 0.61–1.24)
GFR, Estimated: 36 mL/min — ABNORMAL LOW (ref >60)
Glucose, Bld: 150 mg/dL — ABNORMAL HIGH (ref 70–99)
Potassium: 3.9 mmol/L (ref 3.5–5.1)
Sodium: 146 mmol/L — ABNORMAL HIGH (ref 135–145)
Total Bilirubin: 1 mg/dL (ref 0.3–1.2)
Total Protein: 5.8 g/dL — ABNORMAL LOW (ref 6.5–8.1)

## 2020-08-20 LAB — CBC WITH DIFFERENTIAL/PLATELET
Abs Immature Granulocytes: 0.71 10*3/uL — ABNORMAL HIGH (ref 0.00–0.07)
Basophils Absolute: 0.1 10*3/uL (ref 0.0–0.1)
Basophils Relative: 1 %
Eosinophils Absolute: 0 10*3/uL (ref 0.0–0.5)
Eosinophils Relative: 0 %
HCT: 43.4 % (ref 39.0–52.0)
Hemoglobin: 14.3 g/dL (ref 13.0–17.0)
Immature Granulocytes: 4 %
Lymphocytes Relative: 2 %
Lymphs Abs: 0.4 10*3/uL — ABNORMAL LOW (ref 0.7–4.0)
MCH: 26.5 pg (ref 26.0–34.0)
MCHC: 32.9 g/dL (ref 30.0–36.0)
MCV: 80.5 fL (ref 80.0–100.0)
Monocytes Absolute: 0.8 10*3/uL (ref 0.1–1.0)
Monocytes Relative: 5 %
Neutro Abs: 15.1 10*3/uL — ABNORMAL HIGH (ref 1.7–7.7)
Neutrophils Relative %: 88 %
Platelets: 337 10*3/uL (ref 150–400)
RBC: 5.39 MIL/uL (ref 4.22–5.81)
RDW: 15.8 % — ABNORMAL HIGH (ref 11.5–15.5)
WBC: 17.1 10*3/uL — ABNORMAL HIGH (ref 4.0–10.5)
nRBC: 0.2 % (ref 0.0–0.2)

## 2020-08-20 LAB — BLOOD GAS, ARTERIAL
Acid-base deficit: 3.2 mmol/L — ABNORMAL HIGH (ref 0.0–2.0)
Bicarbonate: 21 mmol/L (ref 20.0–28.0)
FIO2: 100
O2 Saturation: 81.1 %
Patient temperature: 37
pCO2 arterial: 43 mmHg (ref 32.0–48.0)
pH, Arterial: 7.326 — ABNORMAL LOW (ref 7.350–7.450)
pO2, Arterial: 55.9 mmHg — ABNORMAL LOW (ref 83.0–108.0)

## 2020-08-20 LAB — FERRITIN: Ferritin: 681 ng/mL — ABNORMAL HIGH (ref 24–336)

## 2020-08-20 LAB — PHOSPHORUS: Phosphorus: 4.4 mg/dL (ref 2.5–4.6)

## 2020-08-20 LAB — D-DIMER, QUANTITATIVE: D-Dimer, Quant: 1.17 ug/mL-FEU — ABNORMAL HIGH (ref 0.00–0.50)

## 2020-08-20 LAB — MAGNESIUM: Magnesium: 2.6 mg/dL — ABNORMAL HIGH (ref 1.7–2.4)

## 2020-08-20 LAB — GLUCOSE, CAPILLARY
Glucose-Capillary: 181 mg/dL — ABNORMAL HIGH (ref 70–99)
Glucose-Capillary: 162 mg/dL — ABNORMAL HIGH (ref 70–99)
Glucose-Capillary: 128 mg/dL — ABNORMAL HIGH (ref 70–99)

## 2020-08-20 LAB — C-REACTIVE PROTEIN: CRP: 5.4 mg/dL — ABNORMAL HIGH (ref <1.0)

## 2020-08-20 MED ORDER — DIPHENHYDRAMINE HCL 50 MG/ML IJ SOLN: 12.5000 mg | INTRAMUSCULAR | Status: DC | PRN

## 2020-08-20 MED ORDER — FENTANYL CITRATE (PF) 100 MCG/2ML IJ SOLN
25.0000 ug | INTRAMUSCULAR | Status: DC | PRN
  Administered 2020-08-20: 25 ug via INTRAVENOUS
  Filled 2020-08-20: qty 2

## 2020-08-20 MED ORDER — GLYCOPYRROLATE 0.2 MG/ML IJ SOLN: 0.2000 mg | INTRAMUSCULAR | Status: DC | PRN

## 2020-08-20 MED ORDER — BISACODYL 10 MG RE SUPP: 10.0000 mg | Freq: Every day | RECTAL | Status: DC | PRN

## 2020-08-20 MED ORDER — LORAZEPAM 2 MG/ML PO CONC: 1.0000 mg | ORAL | Status: DC | PRN

## 2020-08-20 MED ORDER — MORPHINE 100MG IN NS 100ML (1MG/ML) PREMIX INFUSION
0.0000 mg/h | INTRAVENOUS | Status: DC
  Administered 2020-08-20: 1 mg/h via INTRAVENOUS
  Filled 2020-08-20: qty 100

## 2020-08-20 MED ORDER — SODIUM CHLORIDE 0.9% FLUSH: 3.0000 mL | INTRAVENOUS | Status: DC | PRN

## 2020-08-20 MED ORDER — SODIUM CHLORIDE 0.9 % IV SOLN: 250.0000 mL | INTRAVENOUS | Status: DC | PRN

## 2020-08-20 MED ORDER — LORAZEPAM 2 MG/ML IJ SOLN: 1.0000 mg | INTRAMUSCULAR | Status: DC | PRN

## 2020-08-20 MED ORDER — LORAZEPAM 1 MG PO TABS: 1.0000 mg | ORAL_TABLET | ORAL | Status: DC | PRN

## 2020-08-20 MED ORDER — POLYVINYL ALCOHOL 1.4 % OP SOLN: 1.0000 [drp] | Freq: Four times a day (QID) | OPHTHALMIC | Status: DC | PRN

## 2020-08-20 MED ORDER — ACETAMINOPHEN 650 MG RE SUPP: 650.0000 mg | Freq: Four times a day (QID) | RECTAL | Status: DC | PRN

## 2020-08-20 MED ORDER — GLYCOPYRROLATE 1 MG PO TABS: 1.0000 mg | ORAL_TABLET | ORAL | Status: DC | PRN

## 2020-08-20 MED ORDER — SODIUM CHLORIDE 0.9% FLUSH: 3.0000 mL | Freq: Two times a day (BID) | INTRAVENOUS | Status: DC

## 2020-08-20 MED ORDER — ACETAMINOPHEN 325 MG PO TABS: 650.0000 mg | ORAL_TABLET | Freq: Four times a day (QID) | ORAL | Status: DC | PRN

## 2020-08-20 MED ORDER — BIOTENE DRY MOUTH MT LIQD: 15.0000 mL | OROMUCOSAL | Status: DC | PRN

## 2020-08-21 LAB — CULTURE, BLOOD (ROUTINE X 2)
Culture: NO GROWTH
Culture: NO GROWTH
Special Requests: ADEQUATE

## 2020-08-26 ENCOUNTER — Encounter (INDEPENDENT_AMBULATORY_CARE_PROVIDER_SITE_OTHER): Payer: Medicare HMO | Admitting: Ophthalmology

## 2020-09-05 ENCOUNTER — Ambulatory Visit: Payer: Medicare HMO | Admitting: Family Medicine

## 2020-09-09 ENCOUNTER — Ambulatory Visit: Payer: Medicare HMO | Admitting: Family Medicine

## 2020-09-09 NOTE — Progress Notes (Signed)
Pt had orders around lunchtime for restraints to be applied due to pulling off bi-pap. Restraints haven't been applied as attending RN hasn't felt the patient has needed them, as he has only tried to pull off his bi-pap once per shift. Pt has been transitioned to HFNC again and he hasn't tried to pull that off. Family is also present at bedside to talk with patient about final plan of care, and they stated as long as he is alert and oriented they feel it isn't fair for him to be restrained. Order discontinued, will continue to assess. MD made aware.

## 2020-09-09 NOTE — Progress Notes (Signed)
Pt pronounced dead at [facility] by two RN'S B. Valdemar Mcclenahan, RN and R. Jacqualyn Posey, Therapist, sports. Family at bedside. MD made aware. Post mortem checklist to be filled out via night shift RN.

## 2020-09-09 NOTE — Progress Notes (Signed)
   Patient's daughter [name] and granddaughter who are both respiratory therapist in here to visit with patient  -Patient request transition to full comfort care  -Patient's family is agreeable with full transition to comfort care -Orders placed  Roxan Hockey, MD

## 2020-09-09 NOTE — Progress Notes (Signed)
Pt O2 sat's had dropped into the 30's. Upon entering room, patient had pulled off all oxygen. HFNC and NRB placed back on face but O2 levels weren't coming up above 74%. RN decided to place patient back on bi-pap. RT entered room and adjusted settings. Pt is on 100% fiO2, IV Fentanyl given to help calm patient down. MD and E-link RN made aware. Will continue to monitor.

## 2020-09-09 NOTE — Progress Notes (Signed)
60cc of morphine wasted in stericycle from morphine drip. Witnessed by Rosary Lively, RN

## 2020-09-09 NOTE — Plan of Care (Signed)
Discussed with patient plan of care for the evening, pain management and need to stop Heparin gtt with some teach back displayed. Patient understands importance and need for keeping on both the [place] and NRBM at this time.   Explained importance of wearing Bipap at night with no success in him agreeing to wear it at this time.

## 2020-09-09 NOTE — Progress Notes (Signed)
Patient placement notified of completion of post mortem checklist. Body released to go to morgue. CDS says patient is not a candidate for donor.

## 2020-09-09 NOTE — Progress Notes (Signed)
CDS made aware of patient's death. Spoke with April Shore, referral number [phone]. Patient placement aware.

## 2020-09-09 NOTE — Progress Notes (Signed)
Pt expressed his wishes to family that he was ready to start the comfort care process. Dr. Joesph Fillers made aware. Will continue to monitor.

## 2020-09-09 NOTE — Discharge Summary (Signed)
[name] TMY:[id number] DOB: Dec 22, 1935 DOA: 03-Sep-2020  PCP: [name], MD PCP/Office notified:   Admit date: 09/03/2020 Date of Death: 2020-09-08  Final Diagnoses:  Principal Problem:   Pneumonia due to COVID-19 virus Active Problems:   Acute respiratory failure with hypoxemia ([place])   Acute respiratory failure with hypoxia ([place])   Essential hypertension, benign   Acute renal failure superimposed on stage 3 chronic kidney disease ([place])   CHF (congestive heart failure) ([place])   Protein calorie malnutrition ([place])   NSTEMI (non-ST elevated myocardial infarction) ([place])      History of present illness:  Chief Complaint: Difficulty breathing  HPI: [name] [name] is a 84 y.o. male with medical history significant for CABG, hypertension, ischemic cardiomyopathy, BPH.  Patient presented to the ED today with complaints of difficulty breathing of 5 days duration, associated with cough, chest congestion and sneezing wheezing and diarrhea.  At the time of my evaluation patient is on BiPAP.  He also has bilateral lower extremity swelling which he tells me has been for a while.   Patient was vaccinated for Covid in May.  Patient's wife passed from [place] yesterday 10/6.    Per EMS patient's O2 sats was 67% on nonrebreather.  ED Course: Patient placed on BiPAP with O2 sats initially 77% improved, now up to 95 at the time of my evaluation.  WBC 15.7.  Elevation in most inflammatory markers.  ABG showed pH of 7.28, PCO2 42, PO2 of 43.  Lactic acid 3.2 >> 1.6.  Portable chest x-ray shows hazy infiltrate in the left lung question pneumonia, less likely symmetric edema.  Also pulmonary vascular congestion and atelectasis right base. IV Levaquin was started.  1 L bolus given.  10 mg dexamethasone also given.  Hospitalist to admit for acute respiratory failure with hypoxia according to COVID-19 pneumonia.   Hospital Course:   1) Covid pneumonia with acute hypoxic respiratory  failure----despite heated high flow nasal cannula, BiPAP and nonrebreather bag patient continued to have episodes of desaturations Patient's daughter [name] and granddaughter who are both respiratory therapist in here to visit with patient -Patient request transition to full comfort care -Patient's family is agreeable with full transition to comfort care -Patient passed away with family at bedside at  [place] on 2020-09-08     --- Please see progress note dated 2020/09/08 for details of other problems that were addressed during patient's hospital stay  Signed:  Roxan Hockey  [facility] 09-08-20, 7:30 PM

## 2020-09-09 NOTE — Progress Notes (Signed)
Patient not understanding why he needs to keep his oxygen on.  He is reoriented constantly.  He seems to be very hypoxic and delirium due to it.  Will page day MD for blood gas order.

## 2020-09-09 NOTE — Progress Notes (Signed)
Pt received 25 mcg of Fentanyl at 1817. 75 mcg wasted in stericycle bin,witnessed by Loni Muse, RN. Pt was already taken out of Pyxis system when waste was documented.

## 2020-09-09 NOTE — Progress Notes (Signed)
Patient Demographics:    [name] [name], is a 84 y.o. male, DOB - [birth year], LHT:[id number]  Admit date - 09/04/2020   Admitting Physician Bethena Roys, MD  Outpatient Primary MD for the patient is [name] Fraise, MD  LOS - 5  Chief Complaint  Patient presents with   Shortness of Breath        Subjective:    [name] [name] today has persistent cough and shortness of breath with persistent hypoxia  Compliance with Bipap and Heated High Flow Nasal Canula remains inconsistent ---patient continues to pull of mask from time to time - -Patient's daughter [name] [name] who is a respiratory therapist with [facility], and patient's granddaughter who is a respiratory therapist in [place] to visit patient on 08/24/2020 in order to have them make further decisions regarding CODE STATUS and advanced directives -Patient's daughter is aware of very poor oral intake due to severe respiratory distress    Assessment  & Plan :    Principal Problem:   Pneumonia due to COVID-19 virus Active Problems:   Acute respiratory failure with hypoxemia ([place])   Acute respiratory failure with hypoxia ([place])   Essential hypertension, benign   Acute renal failure superimposed on stage 3 chronic kidney disease ([place])   CHF (congestive heart failure) ([place])   Protein calorie malnutrition ([place])   NSTEMI (non-ST elevated myocardial infarction) [facility])  Brief Summary:- 84 y.o. male with medical history significant for CABG (2002), HTN,  ischemic cardiomyopathy, NASH and BPH admitted 09/07/2020 with severe Acute Hypoxic Resp Failure due to Covid Pneumonia and found to have elevated troponins with echocardiogram showing wall motion abnormalities and EF of 45 to 50% Pt Was Vaccinated with Moderna in Late Spring 2021-- Wife who was also Vaccinated died from [place] on 08/12/20   A/p 1)Acute Hypoxic Respiratory Failure  secondary to Covid Pneumonia--- -patient previously vaccinated Dxed 08/14/20 -Severe persistent hypoxia--- alternating between BiPAP and heated high flow oxygen with FiO2 of 100% and 45 L -VQ scan negative for PE -Lower extremity Dopplers without acute DVT -Continue IV steroids started on 08/14/2020 -Completed IV remdesivir on 08/19/2020 -Received Actemra on 08/17/2020 with consent from Pt and daughter -- Ross    08/18/20 0609 08/19/20 0504 August 24, 2020 0433  DDIMER 0.53* 0.65* 1.17*  FERRITIN 883* 831* 681*  CRP 10.9* 8.1* 5.4*    Lab Results  Component Value Date   SARSCOV2NAA POSITIVE (A) 08/21/2020   SARSCOV2NAA Detected (A) 08/14/2020   [place] Not Detected 10/19/2019   -Encourage prone positioning  --Attempt to maintain euvolemic state --Zinc and vitamin C as ordered -Albuterol inhaler as needed -Accu-Cheks/fingersticks while on high-dose steroids -PPI while on high-dose steroids Previously Completed 72 hrs of iv Heparin for NSTEMI--c/n Lovenox for DVT prophylaxis given hypercoagulable state with COVID-19 infections   2)NSTEMI-elevated troponins noted, suspect this is related to demand ischemia in the setting of severe hypoxia in the setting of Covid 19 pneumonia with acute hypoxic respiratory failure ----echo from 08/16/2020 with EF of 45 to 50% which is similar to prior EF based on gated images from April 2019 Echo from 08/16/2020 showed wall motion abnormalities-The basal inferolateral segment and basal inferior segment are hypokinetic Previously Completed 72 hrs of iv Heparin for NSTEMI- Last dose 08/19/20 -  c/n Aspirin, metoprolol and Lipitor  -Patient remains chest pain-free Troponin--- 384>>439>>658>>716>>684>412 -He is very hypoxic currently requiring -Requiring  BiPAP alternating with heated high flow oxygen  in addition to a nonrebreather bag -QT segment was prolonged on admission--monitor mag and potassium closely  3)Possible concomitant  community-acquired bacterial pneumonia--- patient had with leukocytosis and elevated procalcitonin at 1.85 noted Treated with  Rocephin x 5 days -Initially treated with azithromycin -Strep and Legionella antigen Negative  4)HFrEF--patient with history of systolic dysfunction CHF/ischemic cardiomyopathy--- repeat echo with EF of 45 to 50% with wall motion abnormalities -Please see #2 above  5)AKI----acute kidney injury on CKD stage - IIIb   --- due to decreased p.o. intake in the setting of Covid infection -creatinine on admission= 2.11  , baseline creatinine = 1.3 to 1.4 (03/07/20)    , creatinine is now=1.72 ,  -Patient has only one kidney (also reports prior nephrectomy) --renally adjust medications, avoid nephrotoxic agents / dehydration  / hypotension  6)Social/Ethics--- plan of care discussed with patient and daughter, patient is full code with full scope of treatment -However patient and patient's daughter would like to hold off on intubation for as long as possible and try all other means including BiPAP prior to considering intubation if patient does not improve -Patient's wife just passed away from COVID-19 infection on 08/08/20 -Patient's daughter [name] [name] who is a respiratory therapist with [place], and patient's granddaughter who is a respiratory therapist in [place] to visit patient on 2020/09/14 in order to have them make further decisions regarding CODE STATUS and advanced directives -Patient's daughter is aware of very poor oral intake due to severe respiratory distress --Okay to use wrist restraints as patient is frequently pulling mask and respiratory devices  7)MRSA UTI--- Treated with Doxycycline,   8)Urinary Retention/BPH with LUTs--- has foley, c/n  Flomax, unable to do voiding trial at this time as patient is BiPAP dependent and unable to sit on the side of the bed to void  Disposition/Need for in-Hospital Stay- patient unable to be discharged at this time  due to --severe hypoxic respiratory failure secondary to COVID-19 infection/pneumonia requiring  IV steroids and high flow supplemental oxygen on BiPAP  Status is: Inpatient  Remains inpatient appropriate because:-severe hypoxic respiratory failure secondary to COVID-19 infection/pneumonia requiring  IV steroids and high flow supplemental oxygen on BiPAP   Disposition: The patient is from: Home              Anticipated d/c is to: Home              Anticipated d/c date is: > 3 days              Patient currently is not medically stable to d/c. Barriers: Not Clinically Stable- -severe hypoxic respiratory failure secondary to COVID-19 infection/pneumonia requiring  IV steroids and high flow supplemental oxygen on BiPAP Code Status : Full Code  Family Communication:     (patient is alert, awake and coherent)  -Discussed with patient daughter [name] [name] who is a respiratory therapist  Consults  :  PCCM  DVT Prophylaxis  : IV heparin SCDs   Lab Results  Component Value Date   PLT 337 2020/09/14    Inpatient Medications  Scheduled Meds:  albuterol  2 puff Inhalation Q6H   vitamin C  500 mg Oral Daily   aspirin EC  81 mg Oral Q breakfast   atorvastatin  40 mg Oral Daily   Chlorhexidine Gluconate Cloth  6 each Topical Daily  enoxaparin (LOVENOX) injection  40 mg Subcutaneous Q24H   feeding supplement (GLUCERNA SHAKE)  237 mL Oral QID   insulin aspart  0-5 Units Subcutaneous QHS   insulin aspart  0-9 Units Subcutaneous TID WC   methylPREDNISolone (SOLU-MEDROL) injection  40 mg Intravenous Q8H   metoprolol tartrate  25 mg Oral BID   pantoprazole (PROTONIX) IV  40 mg Intravenous Q24H   tamsulosin  0.4 mg Oral QPC supper   zinc sulfate  220 mg Oral Daily   Continuous Infusions:  doxycycline (VIBRAMYCIN) IV 100 mg (08-24-20 0334)   PRN Meds:.acetaminophen, cloNIDine, fentaNYL (SUBLIMAZE) injection, guaiFENesin-dextromethorphan, haloperidol lactate, ondansetron **OR**  ondansetron (ZOFRAN) IV, polyethylene glycol   Anti-infectives (From admission, onward)   Start     Dose/Rate Route Frequency Ordered Stop   08/19/20 1600  doxycycline (VIBRAMYCIN) 100 mg in sodium chloride 0.9 % 250 mL IVPB        100 mg 125 mL/hr over 120 Minutes Intravenous Every 12 hours 08/19/20 1531 08/21/20 1559   08/18/20 1300  doxycycline (VIBRA-TABS) tablet 100 mg  Status:  Discontinued        100 mg Oral Every 12 hours 08/18/20 1141 08/19/20 1531   08/16/20 1730  azithromycin (ZITHROMAX) 500 mg in sodium chloride 0.9 % 250 mL IVPB        500 mg 250 mL/hr over 60 Minutes Intravenous Every 24 hours 08/16/20 1728 08/18/20 1900   08/16/20 1300  cefTRIAXone (ROCEPHIN) 1 g in sodium chloride 0.9 % 100 mL IVPB        1 g 200 mL/hr over 30 Minutes Intravenous Every 24 hours 09/08/2020 2055 08/19/20 1240   08/16/20 1000  remdesivir 100 mg in sodium chloride 0.9 % 100 mL IVPB        100 mg 200 mL/hr over 30 Minutes Intravenous Daily 08/25/2020 1728 08/19/20 1000   08/16/20 0100  doxycycline (VIBRAMYCIN) 100 mg in sodium chloride 0.9 % 250 mL IVPB  Status:  Discontinued        100 mg 125 mL/hr over 120 Minutes Intravenous Every 12 hours 09/03/2020 2055 08/16/20 1728   09/08/2020 1730  remdesivir 100 mg in sodium chloride 0.9 % 100 mL IVPB        100 mg 200 mL/hr over 30 Minutes Intravenous Every 1 hr x 2 08/23/2020 1728 08/18/2020 2008   08/31/2020 1330  levofloxacin (LEVAQUIN) IVPB 750 mg        750 mg 100 mL/hr over 90 Minutes Intravenous  Once 08/26/2020 1318 08/21/2020 1418        Objective:   Vitals:   Aug 24, 2020 0825 24-Aug-2020 0900 08/24/2020 1000 08/24/20 1100  BP:  (!) 156/60 (!) 149/73 (!) 171/63  Pulse:  73 74 92  Resp:  (!) 28 (!) 34 (!) 28  Temp: 98.2 F (36.8 C)     TempSrc: Axillary     SpO2:  99% 100% 95%  Weight:      Height:        Wt Readings from Last 3 Encounters:  Aug 24, 2020 80.1 kg  07/10/20 81.6 kg  05/07/20 81.7 kg     Intake/Output Summary (Last 24 hours) at  08-24-2020 1144 Last data filed at 2020/08/24 0334 Gross per 24 hour  Intake 659.81 ml  Output 2150 ml  Net -1490.19 ml    Physical Exam Gen:- Awake Alert, conversational dyspnea HEENT:- [place].AT, No sclera icterus Nose- Bipap Mask alternating with HFNC Neck-Supple Neck,No JVD,.  Lungs-diminished breath sounds with scattered rhonchi CV- S1, S2  normal, regular  Abd-  +ve B.Sounds, Abd Soft, No tenderness,    Extremity/Skin:-Trace to +1 edema, pedal pulses present  Psych-affect is anxious at times, oriented x3 Neuro-generalized weakness, no new focal deficits, no tremors   Data Review:   Micro Results Recent Results (from the past 240 hour(s))  Novel Coronavirus, NAA ([facility])     Status: Abnormal   Collection Time: 08/14/20  3:50 PM   Specimen: Nasopharyngeal(NP) swabs in vial transport medium  Result Value Ref Range Status   SARS-CoV-2, NAA Detected (A) Not Detected Final    Comment: Patients who have a positive COVID-19 test result may now have treatment options. Treatment options are available for patients with mild to moderate symptoms and for hospitalized patients. Visit our website at [email or web address] for resources and information. This nucleic acid amplification test was developed and its performance characteristics determined by [employer] and [employer]. Nucleic acid amplification tests include RT-PCR and TMA. This test has not been FDA cleared or approved. This test has been authorized by FDA under an Emergency Use Authorization (EUA). This test is only authorized for the duration of time the declaration that circumstances exist justifying the authorization of the emergency use of in vitro diagnostic tests for detection of SARS-CoV-2 virus and/or diagnosis of COVID-19 infection under section 564(b)(1) of the Act, 21 U.S.C. 025ENI-7(P) (1), unless the authorization is terminated or revoked sooner. When diagnostic testing is negativ e, the possibility of a  false negative result should be considered in the context of a patient's recent exposures and the presence of clinical signs and symptoms consistent with COVID-19. An individual without symptoms of COVID-19 and who is not shedding SARS-CoV-2 virus would expect to have a negative (not detected) result in this assay.   SARS-COV-2, NAA 2 DAY TAT     Status: None   Collection Time: 08/14/20  3:50 PM  Result Value Ref Range Status   SARS-CoV-2, NAA 2 DAY TAT Performed  Final  Resp Panel by RT PCR (RSV, Flu A&B, Covid) - Nasopharyngeal Swab     Status: Abnormal   Collection Time: 09/08/2020  1:13 PM   Specimen: Nasopharyngeal Swab  Result Value Ref Range Status   SARS Coronavirus 2 by RT PCR POSITIVE (A) NEGATIVE Final    Comment: RESULT CALLED TO, READ BACK BY AND VERIFIED WITH: DR. ZAMMIT AT [id number] ON [id number] BY THOMPSON S. (NOTE) SARS-CoV-2 target nucleic acids are DETECTED.  SARS-CoV-2 RNA is generally detectable in upper respiratory specimens  during the acute phase of infection. Positive results are indicative of the presence of the identified virus, but do not rule out bacterial infection or co-infection with other pathogens not detected by the test. Clinical correlation with patient history and other diagnostic information is necessary to determine patient infection status. The expected result is Negative.  Fact Sheet for Patients:  PinkCheek.be  Fact Sheet for Healthcare Providers: GravelBags.it  This test is not yet approved or cleared by the [place] FDA and  has been authorized for detection and/or diagnosis of SARS-CoV-2 by FDA under an Emergency Use Authorization (EUA).  This EUA will remain in effect (meaning this tes t can be used) for the duration of  the COVID-19 declaration under Section 564(b)(1) of the Act, 21 U.S.C. section 360bbb-3(b)(1), unless the authorization is terminated or revoked sooner.        Influenza A by PCR NEGATIVE NEGATIVE Final   Influenza B by PCR NEGATIVE NEGATIVE Final    Comment: (NOTE) The Xpert Xpress SARS-CoV-2/FLU/RSV assay is  intended as an aid in  the diagnosis of influenza from Nasopharyngeal swab specimens and  should not be used as a sole basis for treatment. Nasal washings and  aspirates are unacceptable for Xpert Xpress SARS-CoV-2/FLU/RSV  testing.  Fact Sheet for Patients: PinkCheek.be  Fact Sheet for Healthcare Providers: GravelBags.it  This test is not yet approved or cleared by the [place] FDA and  has been authorized for detection and/or diagnosis of SARS-CoV-2 by  FDA under an Emergency Use Authorization (EUA). This EUA will remain  in effect (meaning this test can be used) for the duration of the  Covid-19 declaration under Section 564(b)(1) of the Act, 21  U.S.C. section 360bbb-3(b)(1), unless the authorization is  terminated or revoked.    Respiratory Syncytial Virus by PCR NEGATIVE NEGATIVE Final    Comment: (NOTE) Fact Sheet for Patients: PinkCheek.be  Fact Sheet for Healthcare Providers: GravelBags.it  This test is not yet approved or cleared by the [place] FDA and  has been authorized for detection and/or diagnosis of SARS-CoV-2 by  FDA under an Emergency Use Authorization (EUA). This EUA will remain  in effect (meaning this test can be used) for the duration of the  COVID-19 declaration under Section 564(b)(1) of the Act, 21 U.S.C.  section 360bbb-3(b)(1), unless the authorization is terminated or  revoked. Performed at [facility], [address] [place], [place] [address]   Urine culture     Status: Abnormal   Collection Time: 08/12/2020  1:18 PM   Specimen: In/Out Cath Urine  Result Value Ref Range Status   Specimen Description   Final    IN/OUT CATH URINE Performed at [facility], [address], [place], [place] [address]    Special Requests   Final    NONE Performed at [facility] [address], [place], [place] [address]    Culture (A)  Final    30,000 COLONIES/mL METHICILLIN RESISTANT STAPHYLOCOCCUS AUREUS   Report Status 08/18/2020 FINAL  Final   Organism ID, Bacteria METHICILLIN RESISTANT STAPHYLOCOCCUS AUREUS (A)  Final      Susceptibility   Methicillin resistant staphylococcus aureus - MIC*    CIPROFLOXACIN >=8 RESISTANT Resistant     GENTAMICIN <=0.5 SENSITIVE Sensitive     NITROFURANTOIN 32 SENSITIVE Sensitive     OXACILLIN >=4 RESISTANT Resistant     TETRACYCLINE <=1 SENSITIVE Sensitive     VANCOMYCIN <=0.5 SENSITIVE Sensitive     TRIMETH/SULFA <=10 SENSITIVE Sensitive     CLINDAMYCIN RESISTANT Resistant     RIFAMPIN <=0.5 SENSITIVE Sensitive     Inducible Clindamycin POSITIVE Resistant     * 30,000 COLONIES/mL METHICILLIN RESISTANT STAPHYLOCOCCUS AUREUS  Blood Culture (routine x 2)     Status: None (Preliminary result)   Collection Time: 09/03/2020  1:42 PM   Specimen: BLOOD LEFT HAND  Result Value Ref Range Status   Specimen Description BLOOD LEFT HAND  Final   Special Requests   Final    BOTTLES DRAWN AEROBIC AND ANAEROBIC Blood Culture adequate volume   Culture   Final    NO GROWTH 4 DAYS Performed at [facility], [address] [place], [place] [address]    Report Status PENDING  Incomplete  Blood Culture (routine x 2)     Status: None (Preliminary result)   Collection Time: 08/27/2020  1:42 PM   Specimen: BLOOD LEFT ARM  Result Value Ref Range Status   Specimen Description BLOOD LEFT ARM  Final   Special Requests   Final  BOTTLES DRAWN AEROBIC ONLY Blood Culture results may not be optimal due to an inadequate volume of blood received in culture bottles   Culture   Final    NO GROWTH 4 DAYS Performed at [facility], [address] [place], [place] [address]    Report Status PENDING  Incomplete  MRSA PCR Screening     Status: None    Collection Time: 08/30/2020  7:55 PM   Specimen: Nasal Mucosa; Nasopharyngeal  Result Value Ref Range Status   MRSA by PCR NEGATIVE NEGATIVE Final    Comment:        The GeneXpert MRSA Assay (FDA approved for NASAL specimens only), is one component of a comprehensive MRSA colonization surveillance program. It is not intended to diagnose MRSA infection nor to guide or monitor treatment for MRSA infections. Performed at [facility], [address] [place], [place] [address]     Radiology Reports NM Pulmonary Perf and Vent  Result Date: 08/16/2020 CLINICAL DATA:  Elevated D-dimer, COVID-19 positive EXAM: NUCLEAR MEDICINE PERFUSION LUNG SCAN TECHNIQUE: Perfusion images were obtained in multiple projections after intravenous injection of radiopharmaceutical. Ventilation scans intentionally deferred if perfusion scan and chest x-ray adequate for interpretation during COVID 19 epidemic. RADIOPHARMACEUTICALS:  4 mCi Tc-36m MAA IV COMPARISON:  None Correlation: Chest radiograph 08/19/2020 FINDINGS: Slightly diminished perfusion diffusely in LEFT lung versus RIGHT. Perfusion is much better than expected versus LEFT lung infiltrates seen on chest radiograph. No segmental or subsegmental perfusion defects. IMPRESSION: No scintigraphic evidence of pulmonary embolism. Electronically Signed   By: Lavonia Dana M.D.   On: 08/16/2020 14:35   US Venous Img Lower Bilateral (DVT)  Result Date: 08/18/2020 CLINICAL DATA:  COVID, pulmonary embolism, elevated D-dimer EXAM: BILATERAL LOWER EXTREMITY VENOUS DOPPLER ULTRASOUND TECHNIQUE: Gray-scale sonography with compression, as well as color and duplex ultrasound, were performed to evaluate the deep venous system(s) from the level of the common femoral vein through the popliteal and proximal calf veins. COMPARISON:  None. FINDINGS: VENOUS Normal compressibility of the common femoral, superficial femoral, and popliteal veins, as well as the visualized calf veins.  Visualized portions of profunda femoral vein and great saphenous vein unremarkable. No filling defects to suggest DVT on grayscale or color Doppler imaging. Doppler waveforms show normal direction of venous flow, normal respiratory phasicity and response to augmentation. Limited views of the contralateral common femoral vein are unremarkable. OTHER Bilateral subcutaneous edema at the ankle level. Limitations: none IMPRESSION: No femoropopliteal DVT nor evidence of DVT within the visualized calf veins. If clinical symptoms are inconsistent or if there are persistent or worsening symptoms, further imaging (possibly involving the iliac veins) may be warranted. Electronically Signed   By: Lucrezia Europe M.D.   On: 08/18/2020 15:05   DG CHEST PORT 1 VIEW  Result Date: 08/17/2020 CLINICAL DATA:  Dyspnea and respiratory abnormalities, COVID-19 positive, hypertension CHF EXAM: PORTABLE CHEST 1 VIEW COMPARISON:  Radiograph 08/13/2020 FINDINGS: Lung volumes appear slightly improved. There are persistent heterogeneous airspace opacities in both lungs as well as fissural and septal thickening and possible trace left effusion. Postsurgical changes from prior sternotomy and CABG with enlarged cardiac silhouette, similar to comparison and possibly reflecting some cardiomegaly or pericardial effusion. The aorta is calcified. The remaining cardiomediastinal contours are unremarkable. No acute osseous or soft tissue abnormality. Degenerative changes are present in the imaged spine and shoulders. Telemetry leads overlie the chest. IMPRESSION: 1. Slightly improved lung volumes. 2. Persistent bilateral heterogeneous airspace opacities and fissural thickening, compatible with multifocal infection  in the setting of COVID-19 though some mild edema may be present as well with trace left effusion. 3. Enlarged cardiac silhouette, cardiomegaly versus pericardial effusion. 4.  Aortic Atherosclerosis (ICD10-I70.0). Electronically Signed   By:  Lovena Le M.D.   On: 08/17/2020 15:13   DG Chest Port 1 View  Result Date: 08/11/2020 CLINICAL DATA:  Shortness of breath, oxygen desaturation EXAM: PORTABLE CHEST 1 VIEW COMPARISON:  Portable exam 1320 hours without priors for comparison FINDINGS: Lordotic positioning limits exam. Enlargement of cardiac silhouette with slight vascular congestion post CABG. Mediastinal contours normal. Subsegmental atelectasis RIGHT base. Hazy infiltrates in LEFT lung which could represent infection or less likely asymmetric edema. New no pleural effusion or pneumothorax. Bones demineralized. IMPRESSION: Hazy infiltrates in LEFT lung question pneumonia less likely asymmetric edema. Enlargement of cardiac silhouette with pulmonary vascular congestion. Atelectasis at RIGHT base. Electronically Signed   By: Lavonia Dana M.D.   On: 08/23/2020 13:54   ECHOCARDIOGRAM LIMITED  Result Date: 08/16/2020    ECHOCARDIOGRAM LIMITED REPORT   Patient Name:   WLADYSLAW HENRICHS Date of Exam: 08/16/2020 Medical Rec #:  [id number]        Height:       64.0 in Accession #:    [id number]       Weight:       178.1 lb Date of Birth:  May 01, 1936       BSA:          1.862 m Patient Age:    40 years         BP:           146/70 mmHg Patient Gender: M                HR:           66 bpm. Exam Location:  [facility] Procedure: Limited Echo, Cardiac Doppler and Color Doppler Indications:    Abnormal EKG  History:        Patient has no prior history of Echocardiogram examinations. CHF                 and Cardiomyopathy, CAD and Previous Myocardial Infarction,                 Prior CABG, Signs/Symptoms:Shortness of Breath; Risk                 Factors:Hypertension and Dyslipidemia. COVID+, resp. failure.  Sonographer:    Dustin Flock RDCS Referring Phys: [place]  1. Left ventricular ejection fraction, by estimation, is 45 to 50%. The left ventricle has mildly decreased function. The left ventricle demonstrates regional wall  motion abnormalities (see scoring diagram/findings for description). There is moderate left ventricular hypertrophy.  2. Right ventricular systolic function is normal. The right ventricular size is normal. There is normal pulmonary artery systolic pressure. The estimated right ventricular systolic pressure is 07.1 mmHg.  3. The mitral valve is grossly normal. Mild mitral valve regurgitation.  4. The aortic valve is tricuspid. There is moderate calcification of the aortic valve. Aortic valve Vmax measures 1.79 m/s.  5. The inferior vena cava is normal in size with greater than 50% respiratory variability, suggesting right atrial pressure of 3 mmHg. FINDINGS  Left Ventricle: Left ventricular ejection fraction, by estimation, is 45 to 50%. The left ventricle has mildly decreased function. The left ventricle demonstrates regional wall motion abnormalities. The left ventricular internal cavity size was normal in size. There is moderate left ventricular  hypertrophy.  LV Wall Scoring: The basal inferolateral segment and basal inferior segment are hypokinetic. Right Ventricle: The right ventricular size is normal. No increase in right ventricular wall thickness. Right ventricular systolic function is normal. There is normal pulmonary artery systolic pressure. The tricuspid regurgitant velocity is 2.44 m/s, and  with an assumed right atrial pressure of 3 mmHg, the estimated right ventricular systolic pressure is 20.2 mmHg. Left Atrium: Left atrial size was normal in size. Right Atrium: Right atrial size was normal in size. Pericardium: There is no evidence of pericardial effusion. Mitral Valve: The mitral valve is grossly normal. Mild mitral annular calcification. Mild mitral valve regurgitation. Aortic Valve: The aortic valve is tricuspid. There is moderate calcification of the aortic valve. There is mild aortic valve annular calcification. Aortic valve peak gradient measures 12.8 mmHg. Pulmonic Valve: The pulmonic valve  was grossly normal. Pulmonic valve regurgitation is trivial. Aorta: The aortic root is normal in size and structure. Venous: The inferior vena cava is normal in size with greater than 50% respiratory variability, suggesting right atrial pressure of 3 mmHg. IAS/Shunts: No atrial level shunt detected by color flow Doppler. LEFT VENTRICLE PLAX 2D LVIDd:         4.69 cm  Diastology LVIDs:         3.89 cm  LV e' medial:    3.05 cm/s LV PW:         1.43 cm  LV E/e' medial:  23.3 LV IVS:        1.49 cm  LV e' lateral:   6.42 cm/s LVOT diam:     2.30 cm  LV E/e' lateral: 11.1 LVOT Area:     4.15 cm  LEFT ATRIUM         Index LA diam:    4.30 cm 2.31 cm/m  AORTIC VALVE AV Vmax:      179.00 cm/s AV Peak Grad: 12.8 mmHg  AORTA Ao Root diam: 3.40 cm MITRAL VALVE               TRICUSPID VALVE MV Area (PHT): 3.10 cm    TR Peak grad:   23.8 mmHg MV Decel Time: 245 msec    TR Vmax:        244.00 cm/s MV E velocity: 71.10 cm/s MV A velocity: 82.70 cm/s  SHUNTS MV E/A ratio:  0.86        Systemic Diam: 2.30 cm Rozann Lesches MD Electronically signed by Rozann Lesches MD Signature Date/Time: 08/16/2020/4:46:06 PM    Final      CBC Recent Labs  Lab 08/16/20 5427 08/17/20 0551 08/18/20 0609 08/19/20 0504 08-28-20 0433  WBC 10.4 9.0 9.1 16.2* 17.1*  HGB 12.8* 13.2 13.5 14.7 14.3  HCT 37.7* 39.3 39.9 43.6 43.4  PLT 161 257 273 372 337  MCV 79.0* 79.1* 79.2* 80.3 80.5  MCH 26.8 26.6 26.8 27.1 26.5  MCHC 34.0 33.6 33.8 33.7 32.9  RDW 14.2 14.6 15.1 15.7* 15.8*  LYMPHSABS 0.3* 0.3* 0.4* 0.5* 0.4*  MONOABS 0.4 0.5 0.7 1.7* 0.8  EOSABS 0.0 0.0 0.2 0.0 0.0  BASOSABS 0.0 0.0 0.0 0.0 0.1    Chemistries  Recent Labs  Lab 08/16/20 0219 08/17/20 0551 08/18/20 0609 08/19/20 0504 28-Aug-2020 0433  NA 136 140 142 144 146*  K 3.5 3.4* 3.4* 3.8 3.9  CL 105 106 107 110 111  CO2 20* 22 23 21* 23  GLUCOSE 152* 141* 133* 138* 150*  BUN 32* 46* 56* 58* 56*  CREATININE 1.86* 1.87* 1.87* 1.77* 1.72*  CALCIUM 8.1* 8.4*  8.4* 8.6* 8.7*  MG 1.9 2.3 2.5* 2.7* 2.6*  AST 28 29 26  37 32  ALT 17 17 19 23 25   ALKPHOS 67 59 58 70 77  BILITOT 0.8 1.0 0.9 1.2 1.0   ------------------------------------------------------------------------------------------------------------------ No results for input(s): CHOL, HDL, LDLCALC, TRIG, CHOLHDL, LDLDIRECT in the last 72 hours.  No results found for: HGBA1C ------------------------------------------------------------------------------------------------------------------ No results for input(s): TSH, T4TOTAL, T3FREE, THYROIDAB in the last 72 hours.  Invalid input(s): FREET3 ------------------------------------------------------------------------------------------------------------------ Recent Labs    08/19/20 0504 30-Aug-2020 0433  FERRITIN 831* 681*    Coagulation profile Recent Labs  Lab 08/19/2020 1318  INR 1.3*    Recent Labs    08/19/20 0504 08-30-20 0433  DDIMER 0.65* 1.17*    Cardiac Enzymes No results for input(s): CKMB, TROPONINI, MYOGLOBIN in the last 168 hours.  Invalid input(s): CK ------------------------------------------------------------------------------------------------------------------    Component Value Date/Time   BNP 1,029.0 (H) 08/09/2020 1541   Roxan Hockey M.D on 2020-08-30 at 11:44 AM  Go to [email or web address] - for contact info  [facility] - Office  [phone]

## 2020-09-09 NOTE — Progress Notes (Signed)
Patient taken off of BIPAP at this time and place on HHFNC at 40L and 100%. Tolerating well at this time. RN aware. Will continue to monitor.

## 2020-09-09 DEATH — deceased

## 2021-01-13 ENCOUNTER — Ambulatory Visit: Payer: Medicare HMO | Admitting: Cardiology

## 2021-03-26 IMAGING — DX DG CHEST 1V PORT
1 series · 1 of 1 positions shown · non-contrast
Comparison: Portable exam 9712 hours without priors for comparison

CLINICAL DATA: Shortness of breath, oxygen desaturation

EXAM:
PORTABLE CHEST 1 VIEW

[chest ap]
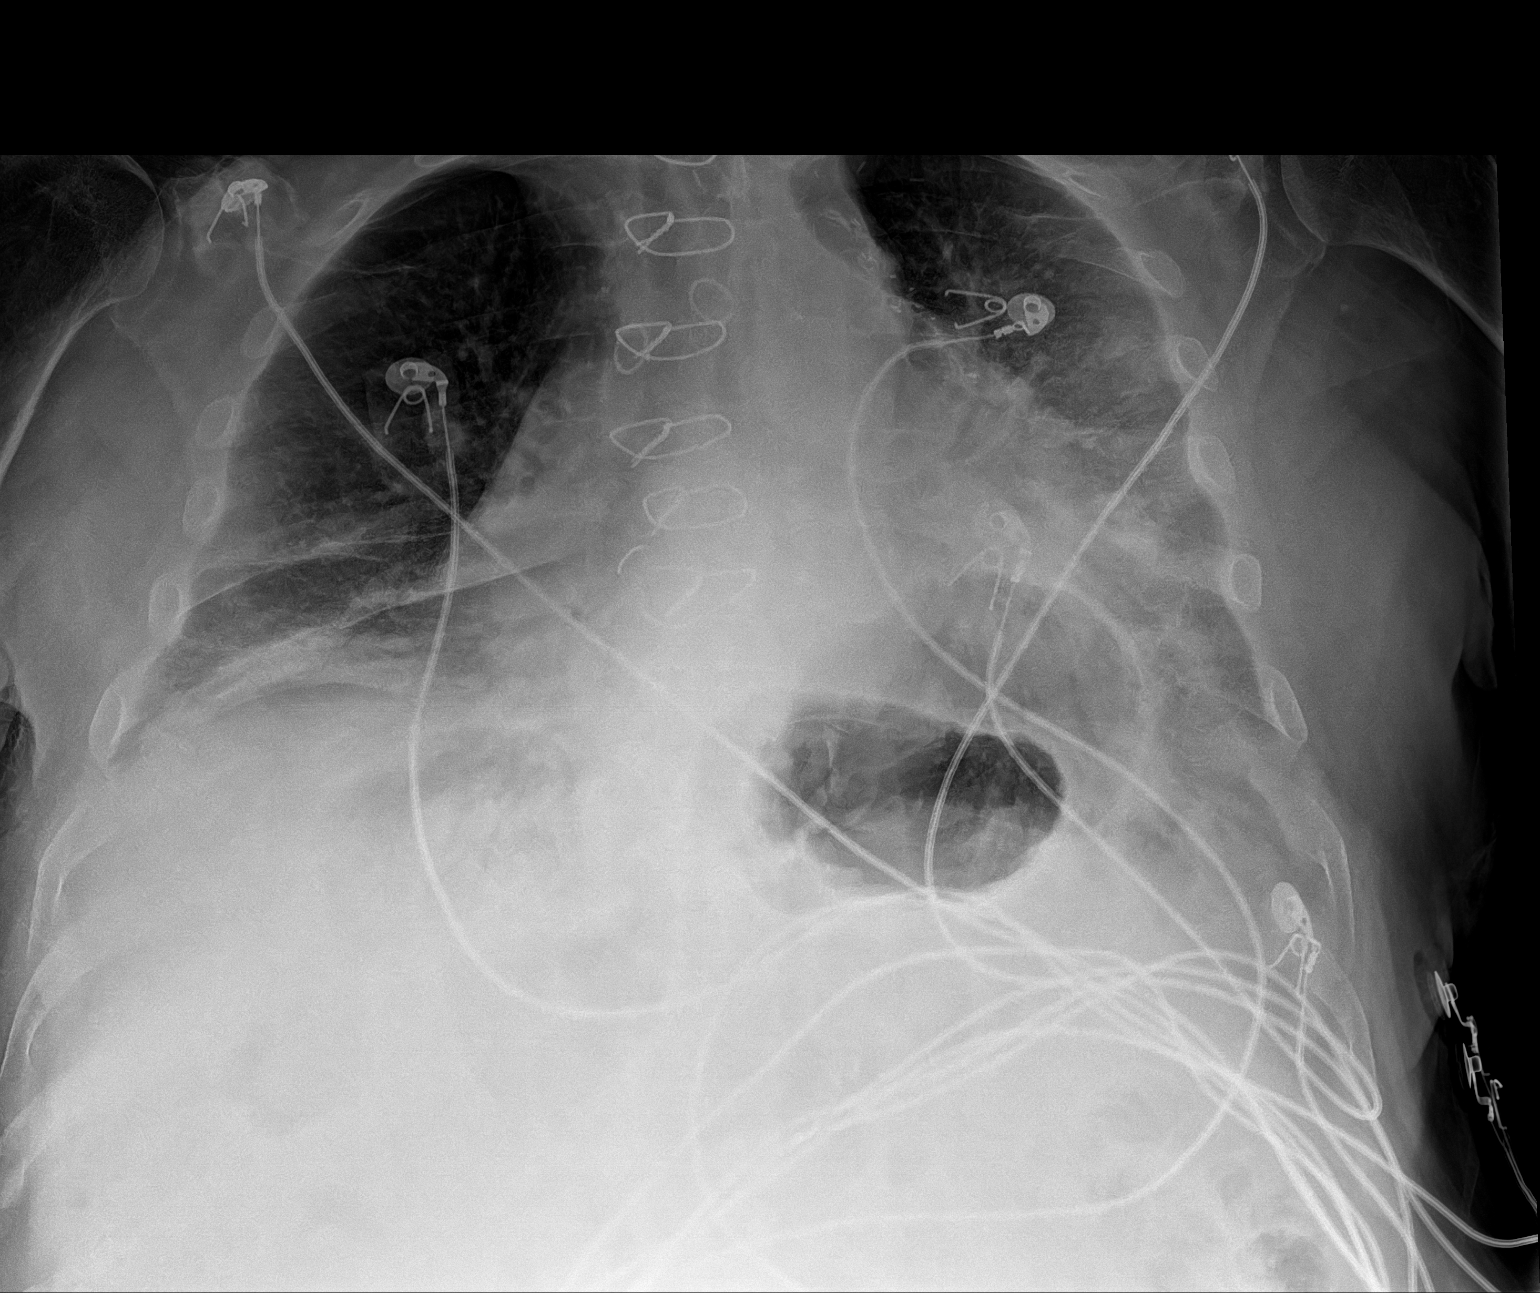

[1 of 1 positions shown; findings below may reference images not displayed]

FINDINGS: Lordotic positioning limits exam.

Enlargement of cardiac silhouette with slight vascular congestion
post CABG.

Mediastinal contours normal.

Subsegmental atelectasis RIGHT base.

Hazy infiltrates in LEFT lung which could represent infection or
less likely asymmetric edema. New no pleural effusion or
pneumothorax.

Bones demineralized.
IMPRESSION: Hazy infiltrates in LEFT lung question pneumonia less likely
asymmetric edema.

Enlargement of cardiac silhouette with pulmonary vascular
congestion.

Atelectasis at RIGHT base.

## 2021-03-28 IMAGING — DX DG CHEST 1V PORT
1 series · 1 of 1 positions shown · non-contrast
Comparison: Radiograph 08/15/2020

CLINICAL DATA: Dyspnea and respiratory abnormalities, [id number]
positive, hypertension CHF

EXAM:
PORTABLE CHEST 1 VIEW

[chest ap]
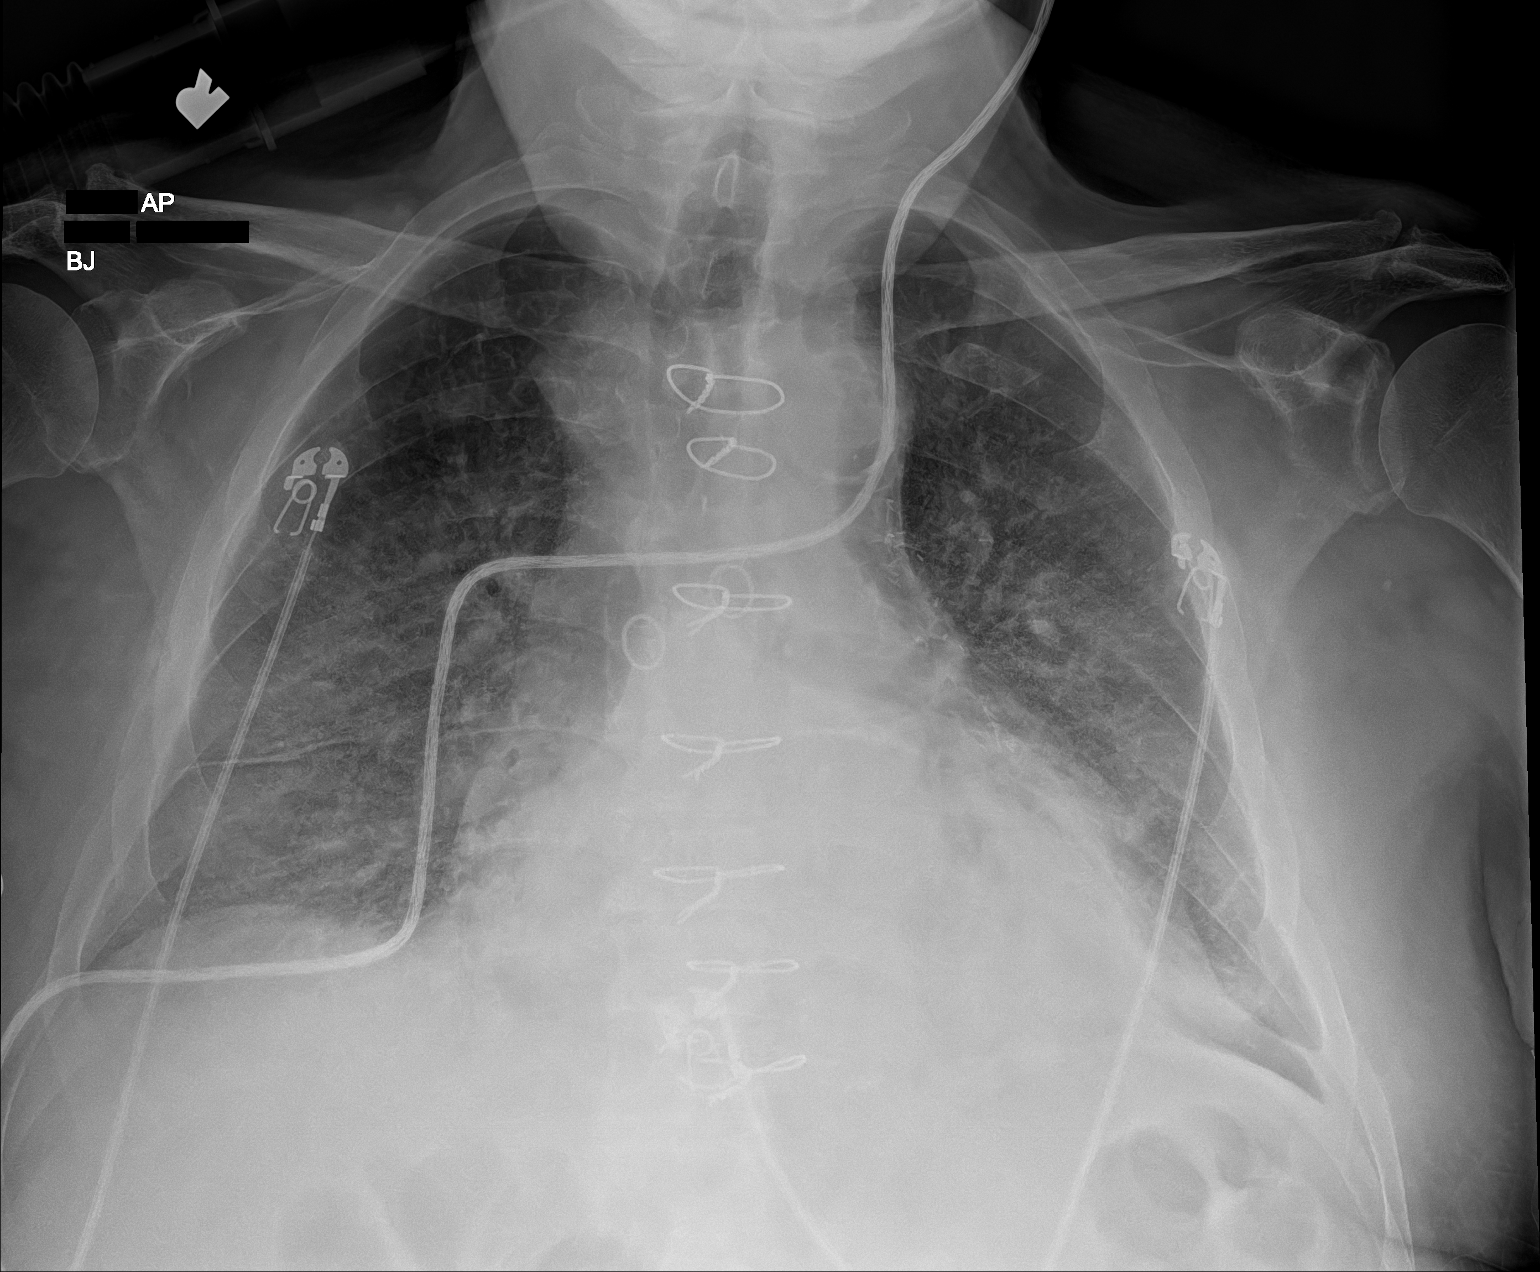

[1 of 1 positions shown; findings below may reference images not displayed]

FINDINGS: Lung volumes appear slightly improved. There are persistent
heterogeneous airspace opacities in both lungs as well as fissural
and septal thickening and possible trace left effusion. Postsurgical
changes from prior sternotomy and CABG with enlarged cardiac
silhouette, similar to comparison and possibly reflecting some
cardiomegaly or pericardial effusion. The aorta is calcified. The
remaining cardiomediastinal contours are unremarkable. No acute
osseous or soft tissue abnormality. Degenerative changes are present
in the imaged spine and shoulders. Telemetry leads overlie the
chest.
IMPRESSION: 1. Slightly improved lung volumes.
2. Persistent bilateral heterogeneous airspace opacities and
fissural thickening, compatible with multifocal infection in the
setting of Z8MCF-HG though some mild edema may be present as well
with trace left effusion.
3. Enlarged cardiac silhouette, cardiomegaly versus pericardial
effusion.
4.  Aortic Atherosclerosis ([id number]).

## 2021-03-29 IMAGING — US US EXTREM LOW VENOUS
1 series · 13 of 24 positions shown · non-contrast
Comparison: None.

CLINICAL DATA: COVID, pulmonary embolism, elevated D-dimer

EXAM:
BILATERAL LOWER EXTREMITY VENOUS DOPPLER ULTRASOUND
TECHNIQUE: Gray-scale sonography with compression, as well as color and duplex
ultrasound, were performed to evaluate the deep venous system(s)
from the level of the common femoral vein through the popliteal and
proximal calf veins.

[Series 1: us venous img lower bilat (dvt) · portal-venous · 13 of 80 slices shown]
[im 1/80]
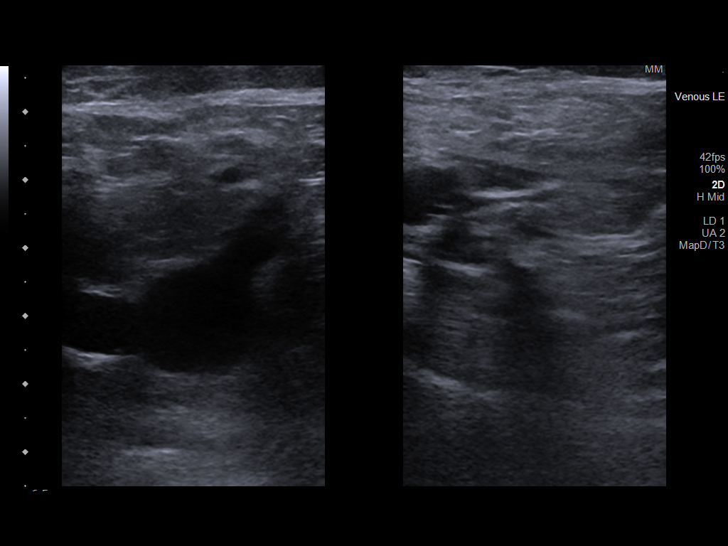
[im 7/80]
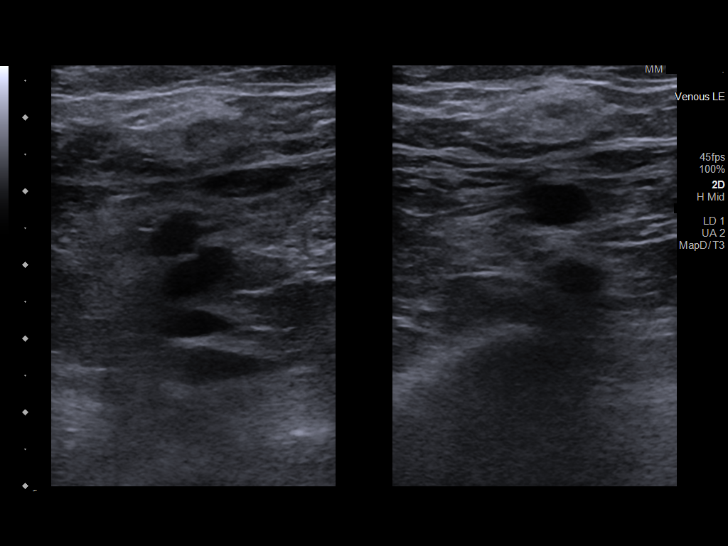
[im 14/80]
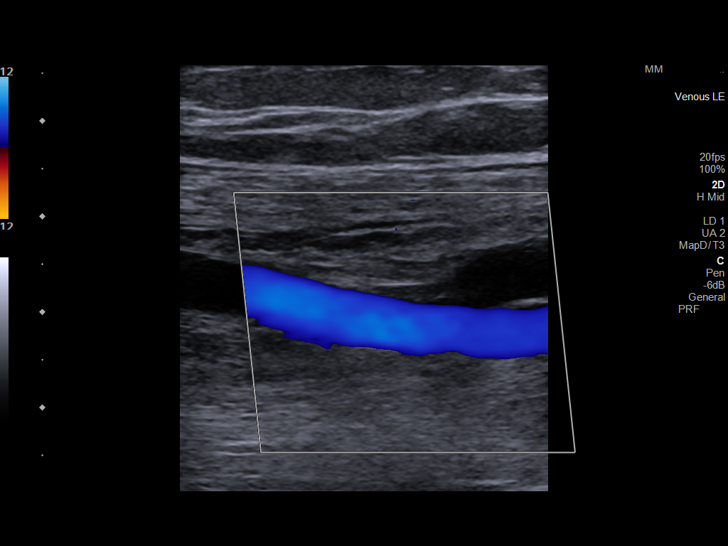
[im 21/80]
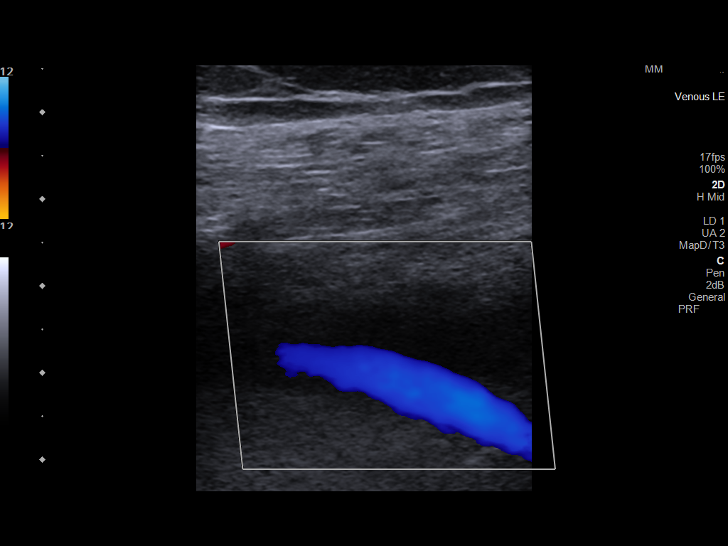
[im 28/80]
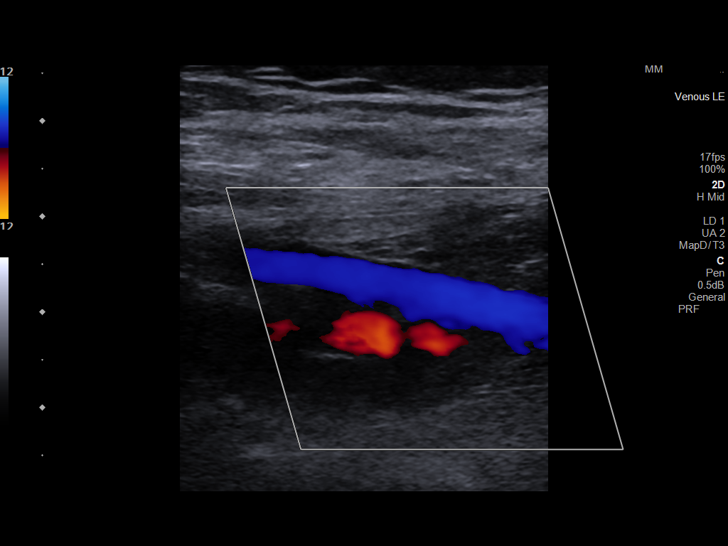
[im 35/80]
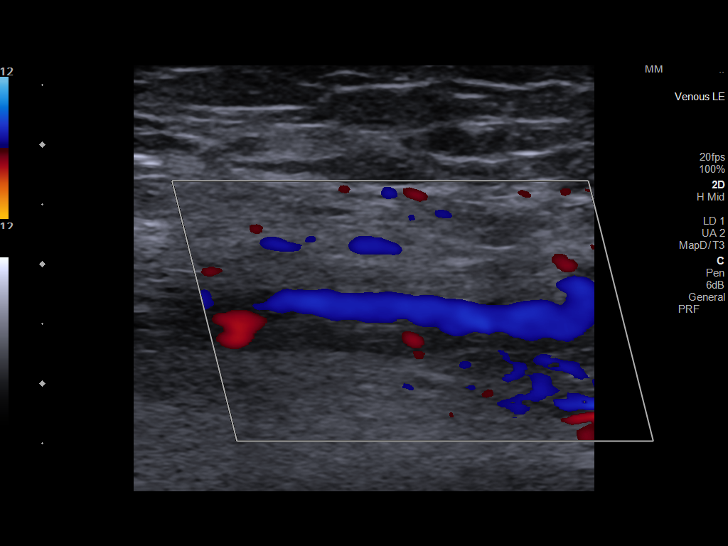
[im 42/80]
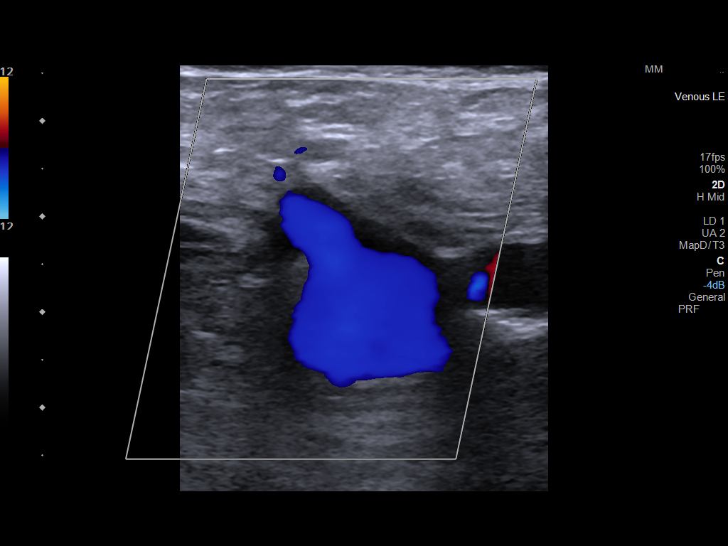
[im 45/80]
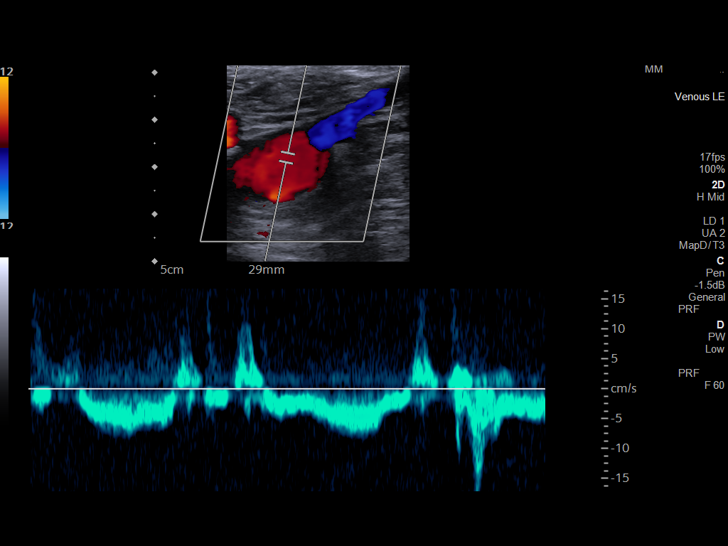
[im 52/80]
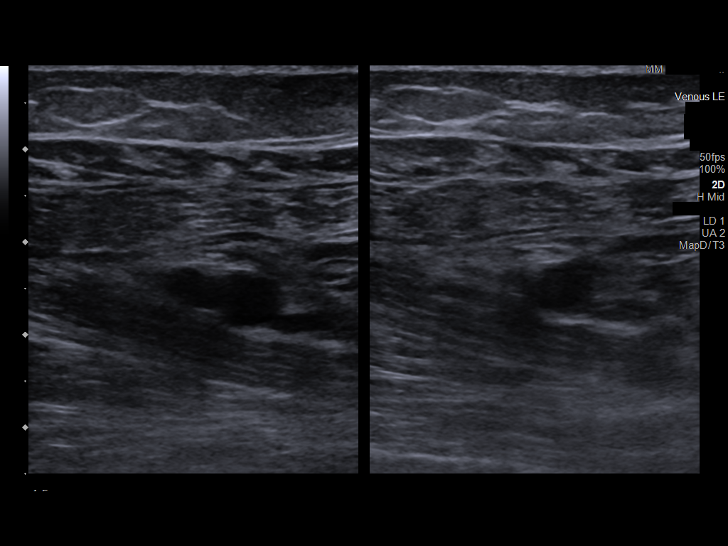
[im 59/80]
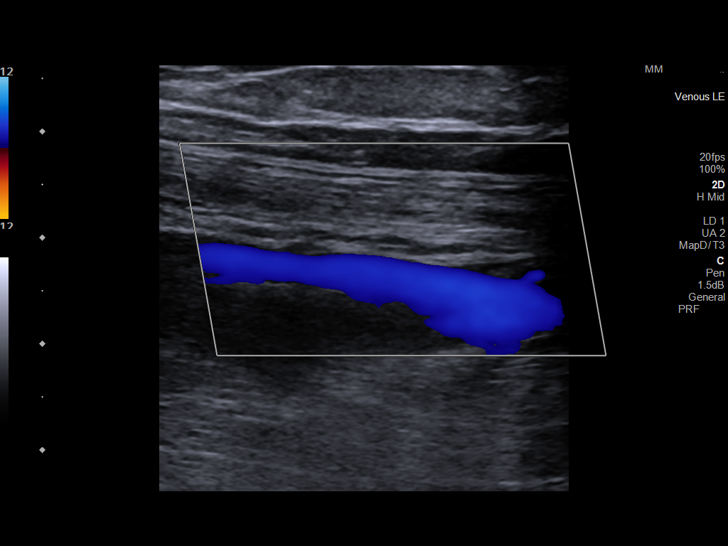
[im 66/80]
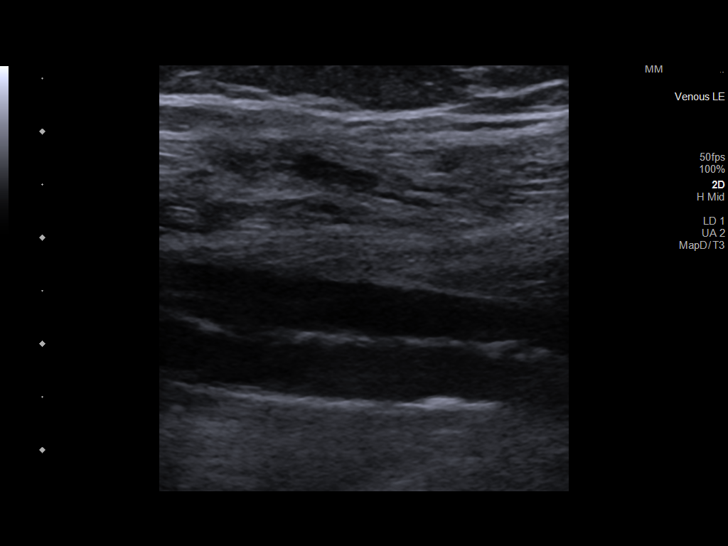
[im 73/80]
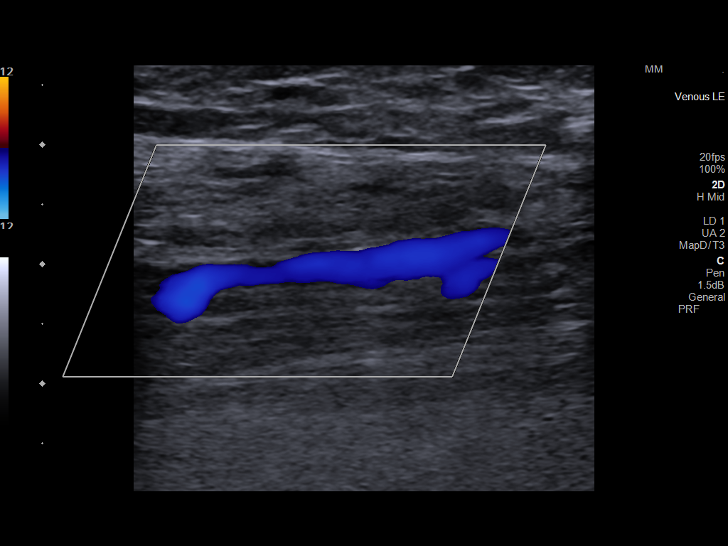
[im 80/80]
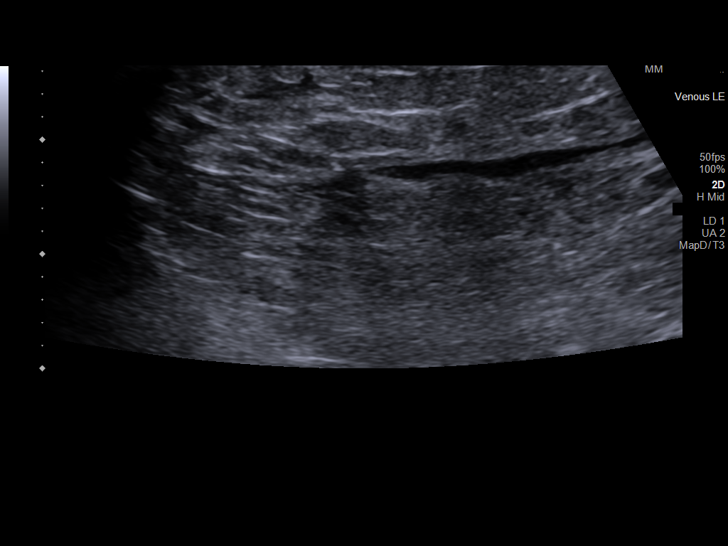

[13 of 24 positions shown; findings below may reference images not displayed]

FINDINGS: VENOUS

Normal compressibility of the common femoral, superficial femoral,
and popliteal veins, as well as the visualized calf veins.
Visualized portions of profunda femoral vein and great saphenous
vein unremarkable. No filling defects to suggest DVT on grayscale or
color Doppler imaging. Doppler waveforms show normal direction of
venous flow, normal respiratory phasicity and response to
augmentation.

Limited views of the contralateral common femoral vein are
unremarkable.

OTHER

Bilateral subcutaneous edema at the ankle level.

Limitations: none
IMPRESSION: No femoropopliteal DVT nor evidence of DVT within the visualized
calf veins.

If clinical symptoms are inconsistent or if there are persistent or
worsening symptoms, further imaging (possibly involving the iliac
veins) may be warranted.
